# Patient Record
Sex: Male | Born: 1955 | Race: White | Hispanic: No | Marital: Single | Smoking: Never smoker
Health system: Southern US, Community
[De-identification: ages and names within clinical notes are randomized; demographics above are authoritative.]

## PROBLEM LIST (undated history)

## (undated) DIAGNOSIS — G4730 Sleep apnea, unspecified: Secondary | ICD-10-CM

## (undated) DIAGNOSIS — Z9889 Other specified postprocedural states: Secondary | ICD-10-CM

## (undated) DIAGNOSIS — R569 Unspecified convulsions: Secondary | ICD-10-CM

## (undated) DIAGNOSIS — E7800 Pure hypercholesterolemia, unspecified: Secondary | ICD-10-CM

## (undated) DIAGNOSIS — G629 Polyneuropathy, unspecified: Secondary | ICD-10-CM

## (undated) DIAGNOSIS — R7303 Prediabetes: Secondary | ICD-10-CM

## (undated) DIAGNOSIS — R112 Nausea with vomiting, unspecified: Secondary | ICD-10-CM

## (undated) DIAGNOSIS — I10 Essential (primary) hypertension: Secondary | ICD-10-CM

## (undated) HISTORY — PX: HERNIA REPAIR: SHX51

## (undated) HISTORY — PX: CHOLECYSTECTOMY: SHX55

## (undated) HISTORY — PX: APPENDECTOMY: SHX54

## (undated) HISTORY — PX: GANGLION CYST EXCISION: SHX1691

## (undated) NOTE — *Deleted (*Deleted)
Peripherally Inserted Central Catheter Placement  The IV Nurse has discussed with the patient and/or persons authorized to consent for the patient, the purpose of this procedure and the potential benefits and risks involved with this procedure.  The benefits include less needle sticks, lab draws from the catheter, and the patient may be discharged home with the catheter. Risks include, but not limited to, infection, bleeding, blood clot (thrombus formation), and puncture of an artery; nerve damage and irregular heartbeat and possibility to perform a PICC exchange if needed/ordered by physician.  Alternatives to this procedure were also discussed.  Bard Power PICC patient education guide, fact sheet on infection prevention and patient information card has been provided to patient /or left at bedside.    PICC Placement Documentation  PICC Single Lumen 02/10/20 PICC Right Cephalic 38 cm 0 cm (Active)  Indication for Insertion or Continuance of Line Home intravenous therapies (PICC only) 02/10/20 1220  Exposed Catheter (cm) 0 cm 02/10/20 1220  Site Assessment Clean;Dry;Intact 02/10/20 1220  Line Status Flushed;Saline locked;Blood return noted 02/10/20 1220  Dressing Type Transparent 02/10/20 1220  Dressing Status Clean;Dry;Intact 02/10/20 1220  Antimicrobial disc in place? Yes 02/10/20 1220  Safety Lock Not Applicable 02/10/20 1220  Line Adjustment (NICU/IV Team Only) No 02/10/20 1220  Dressing Intervention New dressing 02/10/20 1220  Dressing Change Due 02/17/20 02/10/20 1220       Mishal Probert, Lajean Manes 02/10/2020, 12:22 PM

---

## 1997-08-28 ENCOUNTER — Ambulatory Visit (HOSPITAL_BASED_OUTPATIENT_CLINIC_OR_DEPARTMENT_OTHER): Admission: RE | Admit: 1997-08-28 | Discharge: 1997-08-28 | Payer: Self-pay | Admitting: Surgery

## 1997-09-13 ENCOUNTER — Emergency Department (HOSPITAL_COMMUNITY): Admission: EM | Admit: 1997-09-13 | Discharge: 1997-09-13 | Payer: Self-pay | Admitting: Emergency Medicine

## 1997-09-16 ENCOUNTER — Ambulatory Visit (HOSPITAL_COMMUNITY): Admission: RE | Admit: 1997-09-16 | Discharge: 1997-09-16 | Payer: Self-pay | Admitting: Surgery

## 2001-01-28 ENCOUNTER — Ambulatory Visit (HOSPITAL_BASED_OUTPATIENT_CLINIC_OR_DEPARTMENT_OTHER): Admission: RE | Admit: 2001-01-28 | Discharge: 2001-01-28 | Payer: Self-pay

## 2001-02-14 ENCOUNTER — Ambulatory Visit (HOSPITAL_COMMUNITY): Admission: RE | Admit: 2001-02-14 | Discharge: 2001-02-14 | Payer: Self-pay | Admitting: Surgery

## 2001-02-14 ENCOUNTER — Encounter: Payer: Self-pay | Admitting: Surgery

## 2001-03-05 ENCOUNTER — Ambulatory Visit (HOSPITAL_COMMUNITY): Admission: RE | Admit: 2001-03-05 | Discharge: 2001-03-05 | Payer: Self-pay | Admitting: Gastroenterology

## 2001-03-05 ENCOUNTER — Encounter: Payer: Self-pay | Admitting: Gastroenterology

## 2002-04-07 ENCOUNTER — Emergency Department (HOSPITAL_COMMUNITY): Admission: EM | Admit: 2002-04-07 | Discharge: 2002-04-07 | Payer: Self-pay

## 2003-11-12 ENCOUNTER — Encounter: Admission: RE | Admit: 2003-11-12 | Discharge: 2003-11-12 | Payer: Self-pay | Admitting: Family Medicine

## 2003-11-15 ENCOUNTER — Encounter: Admission: RE | Admit: 2003-11-15 | Discharge: 2003-11-15 | Payer: Self-pay | Admitting: Family Medicine

## 2015-02-11 ENCOUNTER — Encounter: Payer: Self-pay | Admitting: *Deleted

## 2015-02-12 ENCOUNTER — Ambulatory Visit: Payer: BLUE CROSS/BLUE SHIELD | Admitting: Anesthesiology

## 2015-02-12 ENCOUNTER — Encounter
Admission: RE | Disposition: A | Discharge status: Home / Self Care | Payer: Self-pay | Source: Ambulatory Visit | Attending: Unknown Physician Specialty

## 2015-02-12 ENCOUNTER — Encounter: Payer: Self-pay | Admitting: *Deleted

## 2015-02-12 ENCOUNTER — Ambulatory Visit
Admission: RE | Admit: 2015-02-12 | Discharge: 2015-02-12 | Disposition: A | Discharge status: Home / Self Care | Payer: BLUE CROSS/BLUE SHIELD | Source: Ambulatory Visit | Attending: Unknown Physician Specialty | Admitting: Unknown Physician Specialty

## 2015-02-12 DIAGNOSIS — Z7951 Long term (current) use of inhaled steroids: Secondary | ICD-10-CM | POA: Diagnosis not present

## 2015-02-12 DIAGNOSIS — Z7982 Long term (current) use of aspirin: Secondary | ICD-10-CM | POA: Diagnosis not present

## 2015-02-12 DIAGNOSIS — Z1211 Encounter for screening for malignant neoplasm of colon: Secondary | ICD-10-CM | POA: Insufficient documentation

## 2015-02-12 DIAGNOSIS — Z8669 Personal history of other diseases of the nervous system and sense organs: Secondary | ICD-10-CM | POA: Insufficient documentation

## 2015-02-12 DIAGNOSIS — G4730 Sleep apnea, unspecified: Secondary | ICD-10-CM | POA: Insufficient documentation

## 2015-02-12 DIAGNOSIS — G629 Polyneuropathy, unspecified: Secondary | ICD-10-CM | POA: Insufficient documentation

## 2015-02-12 DIAGNOSIS — I10 Essential (primary) hypertension: Secondary | ICD-10-CM | POA: Insufficient documentation

## 2015-02-12 DIAGNOSIS — E7800 Pure hypercholesterolemia, unspecified: Secondary | ICD-10-CM | POA: Insufficient documentation

## 2015-02-12 DIAGNOSIS — Z9889 Other specified postprocedural states: Secondary | ICD-10-CM | POA: Diagnosis not present

## 2015-02-12 DIAGNOSIS — Z79899 Other long term (current) drug therapy: Secondary | ICD-10-CM | POA: Diagnosis not present

## 2015-02-12 DIAGNOSIS — K5730 Diverticulosis of large intestine without perforation or abscess without bleeding: Secondary | ICD-10-CM | POA: Insufficient documentation

## 2015-02-12 DIAGNOSIS — K640 First degree hemorrhoids: Secondary | ICD-10-CM | POA: Diagnosis not present

## 2015-02-12 DIAGNOSIS — Z9049 Acquired absence of other specified parts of digestive tract: Secondary | ICD-10-CM | POA: Diagnosis not present

## 2015-02-12 HISTORY — DX: Pure hypercholesterolemia, unspecified: E78.00

## 2015-02-12 HISTORY — DX: Essential (primary) hypertension: I10

## 2015-02-12 HISTORY — DX: Sleep apnea, unspecified: G47.30

## 2015-02-12 HISTORY — PX: COLONOSCOPY: SHX5424

## 2015-02-12 HISTORY — DX: Unspecified convulsions: R56.9

## 2015-02-12 HISTORY — DX: Polyneuropathy, unspecified: G62.9

## 2015-02-12 SURGERY — COLONOSCOPY
Anesthesia: General

## 2015-02-12 MED ORDER — LIDOCAINE HCL (PF) 2 % IJ SOLN
INTRAMUSCULAR | Status: DC | PRN
  Administered 2015-02-12: 50 mg

## 2015-02-12 MED ORDER — SODIUM CHLORIDE 0.9 % IV SOLN
INTRAVENOUS | Status: DC
  Administered 2015-02-12: 11:00:00 via INTRAVENOUS

## 2015-02-12 MED ORDER — FENTANYL CITRATE (PF) 100 MCG/2ML IJ SOLN
INTRAMUSCULAR | Status: DC | PRN
  Administered 2015-02-12: 50 ug via INTRAVENOUS

## 2015-02-12 MED ORDER — PROPOFOL 500 MG/50ML IV EMUL
INTRAVENOUS | Status: DC | PRN
  Administered 2015-02-12: 140 ug/kg/min via INTRAVENOUS

## 2015-02-12 MED ORDER — MIDAZOLAM HCL 5 MG/5ML IJ SOLN
INTRAMUSCULAR | Status: DC | PRN
  Administered 2015-02-12: 1 mg via INTRAVENOUS

## 2015-02-12 MED ORDER — PROPOFOL 10 MG/ML IV BOLUS
INTRAVENOUS | Status: DC | PRN
  Administered 2015-02-12: 50 mg via INTRAVENOUS

## 2015-02-12 MED ORDER — SODIUM CHLORIDE 0.9 % IV SOLN: INTRAVENOUS | Status: DC

## 2015-02-12 NOTE — Transfer of Care (Signed)
Immediate Anesthesia Transfer of Care Note  Patient: Martin Hanna  Procedure(s) Performed: Procedure(s): COLONOSCOPY (N/A)  Patient Location: PACU  Anesthesia Type:General  Level of Consciousness: sedated  Airway & Oxygen Therapy: Patient Spontanous Breathing and Patient connected to nasal cannula oxygen  Post-op Assessment: Report given to RN and Post -op Vital signs reviewed and stable  Post vital signs: Reviewed and stable  Last Vitals:  Filed Vitals:   02/12/15 1155  BP: 113/69  Pulse: 81  Temp: 36.2 C  Resp: 15    Complications: No apparent anesthesia complications

## 2015-02-12 NOTE — H&P (Signed)
   Primary Care Physician:  Fae Pippin Primary Gastroenterologist:  Dr. Vira Agar  Pre-Procedure History & Physical: HPI:  Martin Hanna is a 59 y.o. male is here for an colonoscopy.   Past Medical History  Diagnosis Date  . Neuropathy ([place])   . Hypercholesteremia   . Hypertension   . Sleep apnea   . Seizures ([place])     x2 at 59 years old; none before or since that time    Past Surgical History  Procedure Laterality Date  . Ganglion cyst excision    . Cholecystectomy    . Hernia repair    . Appendectomy      Prior to Admission medications   Medication Sig Start Date End Date Taking? Authorizing Provider  aspirin 81 MG tablet Take 81 mg by mouth daily.   Yes Historical Provider, MD  fluticasone (VERAMYST) 27.5 MCG/SPRAY nasal spray Place 2 sprays into the nose daily.   Yes Historical Provider, MD  geriatric multivitamins-minerals (ELDERTONIC/GEVRABON) ELIX Take 15 mLs by mouth daily.   Yes Historical Provider, MD  lisinopril-hydrochlorothiazide (PRINZIDE,ZESTORETIC) 20-12.5 MG tablet Take 1 tablet by mouth daily.   Yes Historical Provider, MD  naproxen sodium (ANAPROX) 220 MG tablet Take 220 mg by mouth 2 (two) times daily with a meal.   Yes Historical Provider, MD  Omega-3 Fatty Acids (FISH OIL) 1200 MG CAPS Take by mouth.   Yes Historical Provider, MD  traMADol (ULTRAM) 50 MG tablet Take by mouth every 6 (six) hours as needed.   Yes Historical Provider, MD    Allergies as of 01/11/2015  . (Not on File)    History reviewed. No pertinent family history.  Social History   Social History  . Marital Status: Single    Spouse Name: N/A  . Number of Children: N/A  . Years of Education: N/A   Occupational History  . Not on file.   Social History Main Topics  . Smoking status: Never Smoker   . Smokeless tobacco: Not on file  . Alcohol Use: No  . Drug Use: No  . Sexual Activity: Not on file   Other Topics Concern  . Not on file   Social History Narrative     Review of Systems: See HPI, otherwise negative ROS  Physical Exam: BP 153/88 mmHg  Pulse 94  Temp(Src) 98.6 F (37 C) (Tympanic)  Resp 16  Ht 5\' 5"  (1.651 m)  Wt 95.255 kg (210 lb)  BMI 34.95 kg/m2  SpO2 95% General:   Alert,  pleasant and cooperative in NAD Head:  Normocephalic and atraumatic. Neck:  Supple; no masses or thyromegaly. Lungs:  Clear throughout to auscultation.    Heart:  Regular rate and rhythm. Abdomen:  Soft, nontender and nondistended. Normal bowel sounds, without guarding, and without rebound.   Neurologic:  Alert and  oriented x4;  grossly normal neurologically.  Impression/Plan: Martin Hanna is here for an colonoscopy to be performed for screening  Risks, benefits, limitations, and alternatives regarding  colonoscopy have been reviewed with the patient.  Questions have been answered.  All parties agreeable.   Gaylyn Cheers, MD  02/12/2015, 11:23 AM

## 2015-02-12 NOTE — Anesthesia Preprocedure Evaluation (Signed)
Anesthesia Evaluation  Patient identified by MRN, date of birth, ID band Patient awake    Reviewed: Allergy & Precautions, H&P , NPO status , Patient's Chart, lab work & pertinent test results  History of Anesthesia Complications Negative for: history of anesthetic complications  Airway Mallampati: III  TM Distance: >3 FB Neck ROM: full    Dental  (+) Poor Dentition, Missing, Upper Dentures, Lower Dentures   Pulmonary sleep apnea ,    Pulmonary exam normal breath sounds clear to auscultation       Cardiovascular Exercise Tolerance: Good hypertension, (-) angina(-) DOE Normal cardiovascular exam Rhythm:regular Rate:Normal     Neuro/Psych Seizures -, Well Controlled,  negative psych ROS   GI/Hepatic negative GI ROS, Neg liver ROS,   Endo/Other  negative endocrine ROS  Renal/GU negative Renal ROS  negative genitourinary   Musculoskeletal   Abdominal   Peds  Hematology negative hematology ROS (+)   Anesthesia Other Findings Past Medical History:   Neuropathy ([facility])                                             Hypercholesteremia                                           Hypertension                                                 Sleep apnea                                                  Seizures ([facility])                                                 Comment:x72 at 59 years old; none before or since that               time  Past Surgical History:   GANGLION CYST EXCISION                                        CHOLECYSTECTOMY                                               HERNIA REPAIR                                                 APPENDECTOMY  BMI    Body Mass Index   34.94 kg/m 2      Reproductive/Obstetrics negative OB ROS                             Anesthesia Physical Anesthesia Plan  ASA: III  Anesthesia Plan: General    Post-op Pain Management:    Induction:   Airway Management Planned:   Additional Equipment:   Intra-op Plan:   Post-operative Plan:   Informed Consent: I have reviewed the patients History and Physical, chart, labs and discussed the procedure including the risks, benefits and alternatives for the proposed anesthesia with the patient or authorized representative who has indicated his/her understanding and acceptance.   Dental Advisory Given  Plan Discussed with: Anesthesiologist, CRNA and Surgeon  Anesthesia Plan Comments:         Anesthesia Quick Evaluation

## 2015-02-12 NOTE — Anesthesia Postprocedure Evaluation (Signed)
  Anesthesia Post-op Note  Patient: Martin Hanna  Procedure(s) Performed: Procedure(s): COLONOSCOPY (N/A)  Anesthesia type:General  Patient location: PACU  Post pain: Pain level controlled  Post assessment: Post-op Vital signs reviewed, Patient's Cardiovascular Status Stable, Respiratory Function Stable, Patent Airway and No signs of Nausea or vomiting  Post vital signs: Reviewed and stable  Last Vitals:  Filed Vitals:   02/12/15 1225  BP: 117/71  Pulse: 68  Temp:   Resp: 17    Level of consciousness: awake, alert  and patient cooperative  Complications: No apparent anesthesia complications

## 2015-02-12 NOTE — Op Note (Signed)
[facility] Gastroenterology Patient Name: Martin Hanna Procedure Date: 02/12/2015 11:26 AM MRN: [id number] Account #: [id number] Date of Birth: 1955-11-19 Admit Type: Outpatient Age: 59 Room: [facility] ENDO ROOM 1 Gender: Male Note Status: Finalized Procedure:         Colonoscopy Indications:       Screening for colorectal malignant neoplasm Providers:         Manya Silvas, MD Medicines:         Propofol per Anesthesia Complications:     No immediate complications. Procedure:         Pre-Anesthesia Assessment:                    - After reviewing the risks and benefits, the patient was                     deemed in satisfactory condition to undergo the procedure.                    After obtaining informed consent, the colonoscope was                     passed under direct vision. Throughout the procedure, the                     patient's blood pressure, pulse, and oxygen saturations                     were monitored continuously. The Colonoscope was                     introduced through the anus and advanced to the the cecum,                     identified by appendiceal orifice and ileocecal valve. The                     colonoscopy was performed without difficulty. The patient                     tolerated the procedure well. The quality of the bowel                     preparation was good. Findings:      Many small-mouthed diverticula were found in the sigmoid colon and in       the descending colon.      Internal hemorrhoids were found during endoscopy. The hemorrhoids were       small, medium-sized and Grade I (internal hemorrhoids that do not       prolapse).      The exam was otherwise without abnormality. Impression:        - Diverticulosis in the sigmoid colon and in the                     descending colon.                    - Internal hemorrhoids.                    - The examination was otherwise normal.                    - No  specimens collected. Recommendation:    - Repeat colonoscopy in 10 years for  screening purposes. Manya Silvas, MD 02/12/2015 11:55:23 AM This report has been signed electronically. Number of Addenda: 0 Note Initiated On: 02/12/2015 11:26 AM Scope Withdrawal Time: 0 hours 8 minutes 37 seconds  Total Procedure Duration: 0 hours 19 minutes 38 seconds       [facility]

## 2016-07-17 DIAGNOSIS — I10 Essential (primary) hypertension: Secondary | ICD-10-CM | POA: Insufficient documentation

## 2016-07-17 DIAGNOSIS — E669 Obesity, unspecified: Secondary | ICD-10-CM | POA: Insufficient documentation

## 2016-07-17 DIAGNOSIS — Z6837 Body mass index (BMI) 37.0-37.9, adult: Secondary | ICD-10-CM | POA: Insufficient documentation

## 2016-07-17 DIAGNOSIS — G629 Polyneuropathy, unspecified: Secondary | ICD-10-CM | POA: Insufficient documentation

## 2016-09-08 ENCOUNTER — Encounter (HOSPITAL_BASED_OUTPATIENT_CLINIC_OR_DEPARTMENT_OTHER): Payer: Self-pay | Admitting: *Deleted

## 2016-09-13 ENCOUNTER — Ambulatory Visit: Payer: Self-pay | Admitting: Otolaryngology

## 2016-09-13 NOTE — H&P (Signed)
PREOPERATIVE H&P  Chief Complaint: decreased hearing in the left ear  HPI: Martin Hanna is a 61 y.o. male who presents for evaluation of decreased hearing in the left ear. He has a history of an old slag injury to the left ear over 10 years ago. He recently developed an ear infection with drainage from a perforation 2 months ago. The infection has resolved but he still can't hear well and on a gram has a 20 dB conductive hearing loss on the left.  Past Medical History:  Diagnosis Date  . Hypercholesteremia   . Hypertension   . Neuropathy   . Seizures ([place])    x37 at 61 years old; none before or since that time  . Sleep apnea    Past Surgical History:  Procedure Laterality Date  . APPENDECTOMY    . CHOLECYSTECTOMY    . COLONOSCOPY N/A 02/12/2015   Procedure: COLONOSCOPY;  Surgeon: Manya Silvas, MD;  Location: [facility] ENDOSCOPY;  Service: Endoscopy;  Laterality: N/A;  . GANGLION CYST EXCISION    . HERNIA REPAIR     Social History   Social History  . Marital status: Single    Spouse name: N/A  . Number of children: N/A  . Years of education: N/A   Social History Main Topics  . Smoking status: Never Smoker  . Smokeless tobacco: Never Used  . Alcohol use No  . Drug use: No  . Sexual activity: Not Asked   Other Topics Concern  . None   Social History Narrative  . None   History reviewed. No pertinent family history. Allergies  Allergen Reactions  . Crestor [Rosuvastatin Calcium]     Causes muscle cramps   Prior to Admission medications   Medication Sig Start Date End Date Taking? Authorizing Provider  alfuzosin (UROXATRAL) 10 MG 24 hr tablet Take 10 mg by mouth daily with breakfast.   Yes [provider]  aspirin 81 MG tablet Take 81 mg by mouth daily.   Yes [provider]  fluticasone (VERAMYST) 27.5 MCG/SPRAY nasal spray Place 2 sprays into the nose daily.   Yes [provider]  geriatric multivitamins-minerals  (ELDERTONIC/GEVRABON) ELIX Take 15 mLs by mouth daily.   Yes [provider]  Omega-3 Fatty Acids (FISH OIL) 1200 MG CAPS Take by mouth.   Yes [provider]  traMADol (ULTRAM) 50 MG tablet Take by mouth every 6 (six) hours as needed.   Yes [provider]  valsartan-hydrochlorothiazide (DIOVAN-HCT) 160-25 MG tablet Take 1 tablet by mouth daily.   Yes [provider]  naproxen sodium (ANAPROX) 220 MG tablet Take 220 mg by mouth 2 (two) times daily with a meal.    [provider]     Positive ROS: per HPI  All other systems have been reviewed and were otherwise negative with the exception of those mentioned in the HPI and as above.  Physical Exam: There were no vitals filed for this visit.  General: Alert, no acute distress Oral: Normal oral mucosa and tonsils Nasal: Clear nasal passages Neck: No palpable adenopathy or thyroid nodules Ear: Ear canal is clear with normal appearing TM on the right. Left TM with central TM perforation and partial separation of TM from malleus Cardiovascular: Regular rate and rhythm, no murmur.  Respiratory: Clear to auscultation Neurologic: Alert and oriented x 3   Assessment/Plan: TM PERFORATION/CONDUCTIVE HEARING LOSS Plan for Procedure(s): LEFT SIDE TYMPANOPLASTY   Melony Overly, MD 09/13/2016 2:58 PM

## 2016-09-14 ENCOUNTER — Encounter (HOSPITAL_BASED_OUTPATIENT_CLINIC_OR_DEPARTMENT_OTHER)
Admission: RE | Admit: 2016-09-14 | Discharge: 2016-09-14 | Disposition: A | Discharge status: Home / Self Care | Payer: BLUE CROSS/BLUE SHIELD | Source: Ambulatory Visit | Attending: Otolaryngology | Admitting: Otolaryngology

## 2016-09-14 DIAGNOSIS — Z7982 Long term (current) use of aspirin: Secondary | ICD-10-CM | POA: Diagnosis not present

## 2016-09-14 DIAGNOSIS — E7800 Pure hypercholesterolemia, unspecified: Secondary | ICD-10-CM | POA: Diagnosis not present

## 2016-09-14 DIAGNOSIS — Z79899 Other long term (current) drug therapy: Secondary | ICD-10-CM | POA: Diagnosis not present

## 2016-09-14 DIAGNOSIS — I10 Essential (primary) hypertension: Secondary | ICD-10-CM | POA: Diagnosis not present

## 2016-09-14 DIAGNOSIS — H7202 Central perforation of tympanic membrane, left ear: Secondary | ICD-10-CM | POA: Diagnosis present

## 2016-09-14 DIAGNOSIS — H902 Conductive hearing loss, unspecified: Secondary | ICD-10-CM | POA: Diagnosis not present

## 2016-09-14 DIAGNOSIS — Z791 Long term (current) use of non-steroidal anti-inflammatories (NSAID): Secondary | ICD-10-CM | POA: Diagnosis not present

## 2016-09-14 LAB — BASIC METABOLIC PANEL
ANION GAP: 10 (ref 5–15)
BUN: 17 mg/dL (ref 6–20)
CO2: 27 mmol/L (ref 22–32)
Calcium: 9.1 mg/dL (ref 8.9–10.3)
Chloride: 100 mmol/L — ABNORMAL LOW (ref 101–111)
Creatinine, Ser: 0.94 mg/dL (ref 0.61–1.24)
GFR calc Af Amer: >60 mL/min (ref >60)
GLUCOSE: 122 mg/dL — AB (ref 65–99)
POTASSIUM: 4 mmol/L (ref 3.5–5.1)
Sodium: 137 mmol/L (ref 135–145)

## 2016-09-14 NOTE — Progress Notes (Addendum)
Dr.Miller reviewed EKG, read note from Primary Dr. In [place] Alert- ok for surgery. Called Dr. Neena Rhymes office in [place] for  Previous Ekg - none on file.

## 2016-09-15 ENCOUNTER — Ambulatory Visit (HOSPITAL_BASED_OUTPATIENT_CLINIC_OR_DEPARTMENT_OTHER): Payer: BLUE CROSS/BLUE SHIELD | Admitting: Anesthesiology

## 2016-09-15 ENCOUNTER — Ambulatory Visit (HOSPITAL_BASED_OUTPATIENT_CLINIC_OR_DEPARTMENT_OTHER)
Admission: RE | Admit: 2016-09-15 | Discharge: 2016-09-15 | Disposition: A | Discharge status: Home / Self Care | Payer: BLUE CROSS/BLUE SHIELD | Source: Ambulatory Visit | Attending: Otolaryngology | Admitting: Otolaryngology

## 2016-09-15 ENCOUNTER — Encounter (HOSPITAL_BASED_OUTPATIENT_CLINIC_OR_DEPARTMENT_OTHER)
Admission: RE | Disposition: A | Discharge status: Home / Self Care | Payer: Self-pay | Source: Ambulatory Visit | Attending: Otolaryngology

## 2016-09-15 ENCOUNTER — Encounter (HOSPITAL_BASED_OUTPATIENT_CLINIC_OR_DEPARTMENT_OTHER): Payer: Self-pay | Admitting: *Deleted

## 2016-09-15 DIAGNOSIS — Z791 Long term (current) use of non-steroidal anti-inflammatories (NSAID): Secondary | ICD-10-CM | POA: Insufficient documentation

## 2016-09-15 DIAGNOSIS — Z7982 Long term (current) use of aspirin: Secondary | ICD-10-CM | POA: Insufficient documentation

## 2016-09-15 DIAGNOSIS — H902 Conductive hearing loss, unspecified: Secondary | ICD-10-CM | POA: Insufficient documentation

## 2016-09-15 DIAGNOSIS — H7202 Central perforation of tympanic membrane, left ear: Secondary | ICD-10-CM | POA: Insufficient documentation

## 2016-09-15 DIAGNOSIS — I10 Essential (primary) hypertension: Secondary | ICD-10-CM | POA: Insufficient documentation

## 2016-09-15 DIAGNOSIS — E7800 Pure hypercholesterolemia, unspecified: Secondary | ICD-10-CM | POA: Insufficient documentation

## 2016-09-15 DIAGNOSIS — Z79899 Other long term (current) drug therapy: Secondary | ICD-10-CM | POA: Insufficient documentation

## 2016-09-15 HISTORY — PX: TYMPANOPLASTY: SHX33

## 2016-09-15 SURGERY — TYMPANOPLASTY
Anesthesia: General | Site: Ear | Laterality: Left

## 2016-09-15 MED ORDER — DEXAMETHASONE SODIUM PHOSPHATE 4 MG/ML IJ SOLN
INTRAMUSCULAR | Status: DC | PRN
  Administered 2016-09-15: 10 mg via INTRAVENOUS

## 2016-09-15 MED ORDER — LIDOCAINE-EPINEPHRINE 1 %-1:100000 IJ SOLN
INTRAMUSCULAR | Status: AC
  Filled 2016-09-15: qty 1

## 2016-09-15 MED ORDER — METHYLENE BLUE 0.5 % INJ SOLN
INTRAVENOUS | Status: DC | PRN
  Administered 2016-09-15: .1 mL via SUBMUCOSAL

## 2016-09-15 MED ORDER — EPHEDRINE SULFATE 50 MG/ML IJ SOLN
INTRAMUSCULAR | Status: DC | PRN
  Administered 2016-09-15 (×3): 10 mg via INTRAVENOUS

## 2016-09-15 MED ORDER — LIDOCAINE HCL 2 % IJ SOLN
INTRAMUSCULAR | Status: AC
  Filled 2016-09-15: qty 20

## 2016-09-15 MED ORDER — CHLORHEXIDINE GLUCONATE CLOTH 2 % EX PADS: 6.0000 | MEDICATED_PAD | Freq: Once | CUTANEOUS | Status: DC

## 2016-09-15 MED ORDER — HYDROMORPHONE HCL 1 MG/ML IJ SOLN
0.2500 mg | INTRAMUSCULAR | Status: DC | PRN
  Administered 2016-09-15: 0.5 mg via INTRAVENOUS

## 2016-09-15 MED ORDER — PROMETHAZINE HCL 25 MG/ML IJ SOLN: 6.2500 mg | INTRAMUSCULAR | Status: DC | PRN

## 2016-09-15 MED ORDER — CIPROFLOXACIN-DEXAMETHASONE 0.3-0.1 % OT SUSP
OTIC | Status: DC | PRN
  Administered 2016-09-15: 4 [drp] via OTIC

## 2016-09-15 MED ORDER — BACITRACIN ZINC 500 UNIT/GM EX OINT
TOPICAL_OINTMENT | CUTANEOUS | Status: DC | PRN
  Administered 2016-09-15: 1 via TOPICAL

## 2016-09-15 MED ORDER — PROPOFOL 10 MG/ML IV BOLUS
INTRAVENOUS | Status: DC | PRN
  Administered 2016-09-15: 200 mg via INTRAVENOUS

## 2016-09-15 MED ORDER — MIDAZOLAM HCL 2 MG/2ML IJ SOLN
1.0000 mg | INTRAMUSCULAR | Status: DC | PRN
  Administered 2016-09-15: 2 mg via INTRAVENOUS

## 2016-09-15 MED ORDER — CIPROFLOXACIN-DEXAMETHASONE 0.3-0.1 % OT SUSP
OTIC | Status: AC
  Filled 2016-09-15: qty 7.5

## 2016-09-15 MED ORDER — PROPOFOL 10 MG/ML IV BOLUS
INTRAVENOUS | Status: AC
  Filled 2016-09-15: qty 20

## 2016-09-15 MED ORDER — PHENYLEPHRINE HCL 10 MG/ML IJ SOLN
INTRAVENOUS | Status: DC | PRN
  Administered 2016-09-15: 40 ug/min via INTRAVENOUS

## 2016-09-15 MED ORDER — SCOPOLAMINE 1 MG/3DAYS TD PT72: 1.0000 | MEDICATED_PATCH | Freq: Once | TRANSDERMAL | Status: DC | PRN

## 2016-09-15 MED ORDER — EPINEPHRINE 30 MG/30ML IJ SOLN
INTRAMUSCULAR | Status: AC
  Filled 2016-09-15: qty 1

## 2016-09-15 MED ORDER — LACTATED RINGERS IV SOLN
INTRAVENOUS | Status: DC
  Administered 2016-09-15 (×2): via INTRAVENOUS

## 2016-09-15 MED ORDER — FENTANYL CITRATE (PF) 100 MCG/2ML IJ SOLN
50.0000 ug | INTRAMUSCULAR | Status: DC | PRN
  Administered 2016-09-15: 100 ug via INTRAVENOUS

## 2016-09-15 MED ORDER — METHYLENE BLUE 0.5 % INJ SOLN
INTRAVENOUS | Status: AC
  Filled 2016-09-15: qty 10

## 2016-09-15 MED ORDER — EPINEPHRINE PF 1 MG/ML IJ SOLN
INTRAMUSCULAR | Status: DC | PRN
  Administered 2016-09-15: .2 mL

## 2016-09-15 MED ORDER — HYDROMORPHONE HCL 1 MG/ML IJ SOLN
INTRAMUSCULAR | Status: AC
  Filled 2016-09-15: qty 0.5

## 2016-09-15 MED ORDER — LIDOCAINE-EPINEPHRINE 1 %-1:100000 IJ SOLN
INTRAMUSCULAR | Status: DC | PRN
  Administered 2016-09-15: 3.5 mL

## 2016-09-15 MED ORDER — SUCCINYLCHOLINE CHLORIDE 20 MG/ML IJ SOLN
INTRAMUSCULAR | Status: DC | PRN
  Administered 2016-09-15: 120 mg via INTRAVENOUS

## 2016-09-15 MED ORDER — FENTANYL CITRATE (PF) 100 MCG/2ML IJ SOLN
INTRAMUSCULAR | Status: AC
  Filled 2016-09-15: qty 2

## 2016-09-15 MED ORDER — ONDANSETRON HCL 4 MG/2ML IJ SOLN
INTRAMUSCULAR | Status: DC | PRN
  Administered 2016-09-15: 4 mg via INTRAVENOUS

## 2016-09-15 MED ORDER — HYDROCODONE-ACETAMINOPHEN 5-325 MG PO TABS: 1.0000 | ORAL_TABLET | Freq: Four times a day (QID) | ORAL | 0 refills | Status: DC | PRN

## 2016-09-15 MED ORDER — CEFAZOLIN SODIUM-DEXTROSE 2-4 GM/100ML-% IV SOLN
INTRAVENOUS | Status: AC
  Filled 2016-09-15: qty 100

## 2016-09-15 MED ORDER — MIDAZOLAM HCL 2 MG/2ML IJ SOLN
INTRAMUSCULAR | Status: AC
Start: 2016-09-15 — End: 2016-09-15
  Filled 2016-09-15: qty 2

## 2016-09-15 MED ORDER — CEFAZOLIN SODIUM-DEXTROSE 2-4 GM/100ML-% IV SOLN
2.0000 g | INTRAVENOUS | Status: AC
  Administered 2016-09-15: 2 g via INTRAVENOUS

## 2016-09-15 MED ORDER — BACITRACIN ZINC 500 UNIT/GM EX OINT
TOPICAL_OINTMENT | CUTANEOUS | Status: AC
  Filled 2016-09-15: qty 28.35

## 2016-09-15 MED ORDER — CEPHALEXIN 500 MG PO CAPS: 500.0000 mg | ORAL_CAPSULE | Freq: Two times a day (BID) | ORAL | 0 refills | Status: AC

## 2016-09-15 SURGICAL SUPPLY — 57 items
BENZOIN TINCTURE PRP APPL 2/3 (GAUZE/BANDAGES/DRESSINGS) ×6 IMPLANT
BLADE CLIPPER SURG (BLADE) ×3 IMPLANT
BLADE EAR TYMPAN 2.5 60D BEAV (BLADE) ×3 IMPLANT
BLADE EYE SICKLE 84 5 BEAV (BLADE) IMPLANT
BLADE EYE SICKLE 84 5MM BEAV (BLADE)
BLADE NEEDLE 3 SS STRL (BLADE) ×2 IMPLANT
BLADE NEEDLE 3MM SS STRL (BLADE) ×1
CANISTER SUCT 1200ML W/VALVE (MISCELLANEOUS) ×3 IMPLANT
CLOSURE WOUND 1/2 X4 (GAUZE/BANDAGES/DRESSINGS)
COTTONBALL LRG STERILE PKG (GAUZE/BANDAGES/DRESSINGS) ×3 IMPLANT
DECANTER SPIKE VIAL GLASS SM (MISCELLANEOUS) ×3 IMPLANT
DEPRESSOR TONGUE BLADE STERILE (MISCELLANEOUS) ×6 IMPLANT
DERMABOND ADVANCED (GAUZE/BANDAGES/DRESSINGS) ×2
DERMABOND ADVANCED .7 DNX12 (GAUZE/BANDAGES/DRESSINGS) ×1 IMPLANT
DRAPE EENT ADH APERT 31X51 STR (DRAPES) ×3 IMPLANT
DRAPE MICROSCOPE URBAN (DRAPES) ×3 IMPLANT
DRAPE SURG 17X23 STRL (DRAPES) ×3 IMPLANT
DRESSING ADAPTIC 1/2  N-ADH (PACKING) IMPLANT
DRSG GLASSCOCK MASTOID ADT (GAUZE/BANDAGES/DRESSINGS) ×3 IMPLANT
DRSG GLASSCOCK MASTOID PED (GAUZE/BANDAGES/DRESSINGS) IMPLANT
ELECT COATED BLADE 2.86 ST (ELECTRODE) ×3 IMPLANT
ELECT REM PT RETURN 9FT ADLT (ELECTROSURGICAL) ×3
ELECTRODE REM PT RTRN 9FT ADLT (ELECTROSURGICAL) ×1 IMPLANT
GAUZE SPONGE 4X4 12PLY STRL (GAUZE/BANDAGES/DRESSINGS) IMPLANT
GAUZE SPONGE 4X4 12PLY STRL LF (GAUZE/BANDAGES/DRESSINGS) ×3 IMPLANT
GLOVE SS BIOGEL STRL SZ 7.5 (GLOVE) ×1 IMPLANT
GLOVE SUPERSENSE BIOGEL SZ 7.5 (GLOVE) ×2
GOWN STRL REUS W/ TWL LRG LVL3 (GOWN DISPOSABLE) ×2 IMPLANT
GOWN STRL REUS W/ TWL XL LVL3 (GOWN DISPOSABLE) ×1 IMPLANT
GOWN STRL REUS W/TWL LRG LVL3 (GOWN DISPOSABLE) ×4
GOWN STRL REUS W/TWL XL LVL3 (GOWN DISPOSABLE) ×2
IV CATH AUTO 14GX1.75 SAFE ORG (IV SOLUTION) IMPLANT
IV NS 500ML (IV SOLUTION)
IV NS 500ML BAXH (IV SOLUTION) IMPLANT
IV SET EXT 30 76VOL 4 MALE LL (IV SETS) ×3 IMPLANT
NDL SAFETY ECLIPSE 18X1.5 (NEEDLE) IMPLANT
NEEDLE HYPO 18GX1.5 SHARP (NEEDLE)
NEEDLE PRECISIONGLIDE 27X1.5 (NEEDLE) ×3 IMPLANT
NS IRRIG 1000ML POUR BTL (IV SOLUTION) ×3 IMPLANT
PACK BASIN DAY SURGERY FS (CUSTOM PROCEDURE TRAY) ×3 IMPLANT
PACK ENT DAY SURGERY (CUSTOM PROCEDURE TRAY) ×3 IMPLANT
PENCIL BUTTON HOLSTER BLD 10FT (ELECTRODE) ×3 IMPLANT
SHEET MEDIUM DRAPE 40X70 STRL (DRAPES) ×3 IMPLANT
SLEEVE SCD COMPRESS KNEE MED (MISCELLANEOUS) ×3 IMPLANT
SPONGE SURGIFOAM ABS GEL 12-7 (HEMOSTASIS) ×3 IMPLANT
STRIP CLOSURE SKIN 1/2X4 (GAUZE/BANDAGES/DRESSINGS) IMPLANT
SUT CHROMIC 2 0 SH (SUTURE) IMPLANT
SUT CHROMIC 3 0 PS 2 (SUTURE) ×3 IMPLANT
SUT ETHILON 4 0 P 3 18 (SUTURE) IMPLANT
SUT ETHILON 5 0 P 3 18 (SUTURE)
SUT NYLON ETHILON 5-0 P-3 1X18 (SUTURE) IMPLANT
SUT PLAIN 5 0 P 3 18 (SUTURE) IMPLANT
SYR 3ML 18GX1 1/2 (SYRINGE) ×3 IMPLANT
SYR TB 1ML LL NO SAFETY (SYRINGE) IMPLANT
TOWEL OR 17X24 6PK STRL BLUE (TOWEL DISPOSABLE) ×6 IMPLANT
TRAY DSU PREP LF (CUSTOM PROCEDURE TRAY) ×3 IMPLANT
TUBING IRRIGATION (MISCELLANEOUS) ×3 IMPLANT

## 2016-09-15 NOTE — Anesthesia Procedure Notes (Signed)
Procedure Name: Intubation Date/Time: 09/15/2016 7:41 AM Performed by: Melynda Ripple D Pre-anesthesia Checklist: Patient identified, Emergency Drugs available, Suction available and Patient being monitored Patient Re-evaluated:Patient Re-evaluated prior to inductionOxygen Delivery Method: Circle system utilized Preoxygenation: Pre-oxygenation with 100% oxygen Intubation Type: IV induction Ventilation: Mask ventilation without difficulty Laryngoscope Size: Mac Grade View: Grade III Tube type: Oral Tube size: 7.0 mm Number of attempts: 1 Airway Equipment and Method: Stylet and Oral airway Placement Confirmation: ETT inserted through vocal cords under direct vision,  positive ETCO2 and breath sounds checked- equal and bilateral Secured at: 23 cm Tube secured with: Tape Dental Injury: Teeth and Oropharynx as per pre-operative assessment  Difficulty Due To: Difficult Airway- due to limited oral opening, Difficult Airway- due to large tongue and Difficulty was anticipated

## 2016-09-15 NOTE — Op Note (Signed)
NAME:  Martin Hanna, Martin Hanna                  ACCOUNT NO.:  MEDICAL RECORD NO.:  [id number]  LOCATION:                                 FACILITY:  PHYSICIAN:  Leonides Sake. Lucia Gaskins, M.D. 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  OPERATION PERFORMED:  Left medial graft tympanoplasty.  SURGEON:  Leonides Sake. Lucia Gaskins, M.D.  ANESTHESIA:  General endotracheal.  COMPLICATIONS:  None.  BRIEF CLINICAL NOTE:  Martin Hanna is a 61 year old gentleman, who has had some longstanding problems with his left ear ever since he had a slag injury several decades ago to the left ear canal and TM.  More recently, he developed acute left ear infection with drainage and had a chronic left TM perforation that has not healed.  In addition, hearing test showed a 20 to 25-decibel conductive hearing loss in the left ear. The perforation was fairly small, however, the distal half of the malleus was separated from the TM.  He is taken to the operating room at this time for left medial graft tympanoplasty.  DESCRIPTION OF PROCEDURE:  After adequate endotracheal anesthesia, the left ear was prepped with Betadine solution and draped out with sterile towels.  Photos were obtained of the ear canal and perforation.  The rim of the perforation was freshened up with a pick and cup forceps back to where the TM actually attached to the malleus.  The distal portion of the malleus that had separated from the TM was freshened up with a sharp pick.  The undersurface of the malleus was scraped.  Fraction of the mucosa underneath the malleus in order to set the graft.  A posterior- based tympanomeatal flap was elevated down to the anulus which  was elevated out of the sulcus.  A temporalis fascia graft was previously harvested from just above the right ear and set aside to dry and the donor site was closed with 3-0 chromic sutures subcutaneously and 5-0 plain gut on the skin edges.  After irrigating the middle ear space, there was a fair amount of granulation tissue and some chronic inflammation around the incus and stapes superstructure, but everything was intact and the stapes was mobile, although it was difficult to adequately visualize the oval window because of some of the chronic inflammation.  Some of these adhesions were loosened up, but the stapes was definitely mobile, and the malleus and incus were mobile as well on palpation.  The fascia graft was cut to appropriate size, was placed medial to the long process of the malleus.  The middle ear was packed with Gelfoam soaked in Ciprodex.  The tympanomeatal flap was brought back down and the fascia graft to cover the entire perforation site that had been enlarged with a pick and cup forceps.  The ear canal was then packed with Gelfoam soaked in Ciprodex.  Glasscock mastoid dressing was applied.  The patient was awoken from anesthesia and transferred to recovery room, postop doing well.  DISPOSITION:  The patient was discharged home later this morning on Keflex 500 mg b.i.d. for 1 week, Tylenol, Motrin, or hydrocodone p.r.n. pain.  We will have him follow up in my office in 1 week for recheck. We will plan on removing the ear canal packing in 2-3 weeks.          ______________________________ Leonides Sake Lucia Gaskins, M.D.     CEN/MEDQ  D:  09/15/2016  T:  09/15/2016  Job:  [id number]

## 2016-09-15 NOTE — Progress Notes (Signed)
Pt continues to be diaphoretic after cooling efforts, sats low in the 90's on room air, states his left arm is numb, Dr. Aris Lot made aware, will come by to see pt.

## 2016-09-15 NOTE — Anesthesia Postprocedure Evaluation (Signed)
Anesthesia Post Note  Patient: Martin Hanna  Procedure(s) Performed: Procedure(s) (LRB): LEFT SIDE TYMPANOPLASTY (Left)     Patient location during evaluation: PACU Anesthesia Type: General Level of consciousness: awake and alert Pain management: pain level controlled Vital Signs Assessment: post-procedure vital signs reviewed and stable Respiratory status: spontaneous breathing, nonlabored ventilation, respiratory function stable and patient connected to nasal cannula oxygen Cardiovascular status: blood pressure returned to baseline and stable Postop Assessment: no signs of nausea or vomiting Anesthetic complications: no    Last Vitals:  Vitals:   09/15/16 1100 09/15/16 1115  BP: 123/84 118/75  Pulse: 94 97  Resp: (!) 23 19  Temp:      Last Pain:  Vitals:   09/15/16 1055  TempSrc:   PainSc: 4                  Montez Hageman

## 2016-09-15 NOTE — Anesthesia Preprocedure Evaluation (Addendum)
Anesthesia Evaluation  Patient identified by MRN, date of birth, ID band Patient awake    Reviewed: Allergy & Precautions, NPO status , Patient's Chart, lab work & pertinent test results  History of Anesthesia Complications Negative for: history of anesthetic complications  Airway Mallampati: III   Neck ROM: Full  Mouth opening: Limited Mouth Opening  Dental  (+) Edentulous Upper, Edentulous Lower   Pulmonary sleep apnea ,    breath sounds clear to auscultation       Cardiovascular hypertension,  Rhythm:Regular Rate:Normal     Neuro/Psych Seizures -,     GI/Hepatic negative GI ROS, Neg liver ROS,   Endo/Other  negative endocrine ROS  Renal/GU negative Renal ROS     Musculoskeletal   Abdominal (+) + obese,   Peds  Hematology negative hematology ROS (+)   Anesthesia Other Findings   Reproductive/Obstetrics                            Anesthesia Physical Anesthesia Plan  ASA: III  Anesthesia Plan: General   Post-op Pain Management:    Induction: Intravenous  PONV Risk Score and Plan: 3 and Ondansetron, Dexamethasone, Propofol and Midazolam  Airway Management Planned: Oral ETT  Additional Equipment:   Intra-op Plan:   Post-operative Plan: Extubation in OR  Informed Consent: I have reviewed the patients History and Physical, chart, labs and discussed the procedure including the risks, benefits and alternatives for the proposed anesthesia with the patient or authorized representative who has indicated his/her understanding and acceptance.     Plan Discussed with:   Anesthesia Plan Comments:        Anesthesia Quick Evaluation

## 2016-09-15 NOTE — Discharge Instructions (Addendum)
°  Post Anesthesia Home Care Instructions  Activity: Get plenty of rest for the remainder of the day. A responsible individual must stay with you for 24 hours following the procedure.  For the next 24 hours, DO NOT: -Drive a car -Paediatric nurse -Drink alcoholic beverages -Take any medication unless instructed by your physician -Make any legal decisions or sign important papers.  Meals: Start with liquid foods such as gelatin or soup. Progress to regular foods as tolerated. Avoid greasy, spicy, heavy foods. If nausea and/or vomiting occur, drink only clear liquids until the nausea and/or vomiting subsides. Call your physician if vomiting continues.  Special Instructions/Symptoms: Your throat may feel dry or sore from the anesthesia or the breathing tube placed in your throat during surgery. If this causes discomfort, gargle with warm salt water. The discomfort should disappear within 24 hours.  If you had a scopolamine patch placed behind your ear for the management of post- operative nausea and/or vomiting:  1. The medication in the patch is effective for 72 hours, after which it should be removed.  Wrap patch in a tissue and discard in the trash. Wash hands thoroughly with soap and water. 2. You may remove the patch earlier than 72 hours if you experience unpleasant side effects which may include dry mouth, dizziness or visual disturbances. 3. Avoid touching the patch. Wash your hands with soap and water after contact with the patch.   Remove ear dressing and change cotton ball in ear tomorrow am Keep all water out of the ear canal OK to wash behind and around the ear Apply antibiotic ointment to small incision above the ear Start Keflex 500 mg twice per day tonight Tylenol, motrin or hydrocodone prn pain Call office for follow up appt in 1 week    [phone]

## 2016-09-15 NOTE — Progress Notes (Signed)
  Dr. Aris Lot by to see patient, okay given to send home.

## 2016-09-15 NOTE — Brief Op Note (Signed)
09/15/2016  9:53 AM  PATIENT:  Martin Hanna  61 y.o. male  PRE-OPERATIVE DIAGNOSIS:  TM PERFORATION/CONDUCTIVE HEARING LOSS (left)  POST-OPERATIVE DIAGNOSIS:  TM PERFORATION/CINDUCTIVE HEARING LOSS  PROCEDURE:  Procedure(s): LEFT SIDE TYMPANOPLASTY (Left)  SURGEON:  Surgeon(s) and Role:    Rozetta Nunnery, MD - Primary  PHYSICIAN ASSISTANT:   ASSISTANTS: none   ANESTHESIA:   general  EBL:  Total I/O In: 1000 [I.V.:1000] Out: -   BLOOD ADMINISTERED:none  DRAINS: none   LOCAL MEDICATIONS USED:  XYLOCAINE with EPI 5 cc  SPECIMEN:  No Specimen  DISPOSITION OF SPECIMEN:  N/A  COUNTS:  YES  TOURNIQUET:  * No tourniquets in log *  DICTATION: .Other Dictation: Dictation Number [id number]  PLAN OF CARE: Discharge to home after PACU  PATIENT DISPOSITION:  PACU - hemodynamically stable.   Delay start of Pharmacological VTE agent (>24hrs) due to surgical blood loss or risk of bleeding: yes

## 2016-09-15 NOTE — Interval H&P Note (Signed)
History and Physical Interval Note:  09/15/2016 7:28 AM  Martin Hanna  has presented today for surgery, with the diagnosis of TM PERFORATION/CONDUCTIVE HEARING LOSS  The various methods of treatment have been discussed with the patient and family. After consideration of risks, benefits and other options for treatment, the patient has consented to  Procedure(s): LEFT SIDE TYMPANOPLASTY (Left) as a surgical intervention .  The patient's history has been reviewed, patient examined, no change in status, stable for surgery.  I have reviewed the patient's chart and labs.  Questions were answered to the patient's satisfaction.     Martin Hanna

## 2016-09-15 NOTE — Transfer of Care (Signed)
Immediate Anesthesia Transfer of Care Note  Patient: Martin Hanna  Procedure(s) Performed: Procedure(s): LEFT SIDE TYMPANOPLASTY (Left)  Patient Location: PACU  Anesthesia Type:General  Level of Consciousness: awake and drowsy  Airway & Oxygen Therapy: Patient Spontanous Breathing and Patient connected to face mask oxygen  Post-op Assessment: Report given to RN and Post -op Vital signs reviewed and stable  Post vital signs: Reviewed  Last Vitals:  Vitals:   09/15/16 0646  BP: 136/87  Pulse: 78  Resp: 17  Temp: 36.7 C    Last Pain:  Vitals:   09/15/16 0646  TempSrc: Oral  PainSc: 0-No pain         Complications: No apparent anesthesia complications

## 2016-09-18 ENCOUNTER — Encounter (HOSPITAL_BASED_OUTPATIENT_CLINIC_OR_DEPARTMENT_OTHER): Payer: Self-pay | Admitting: Otolaryngology

## 2016-11-24 DIAGNOSIS — R7309 Other abnormal glucose: Secondary | ICD-10-CM | POA: Insufficient documentation

## 2017-08-02 DIAGNOSIS — S2221XA Fracture of manubrium, initial encounter for closed fracture: Secondary | ICD-10-CM | POA: Insufficient documentation

## 2017-08-02 DIAGNOSIS — S2242XD Multiple fractures of ribs, left side, subsequent encounter for fracture with routine healing: Secondary | ICD-10-CM | POA: Insufficient documentation

## 2017-08-02 DIAGNOSIS — S2239XA Fracture of one rib, unspecified side, initial encounter for closed fracture: Secondary | ICD-10-CM | POA: Insufficient documentation

## 2017-08-10 DIAGNOSIS — S2221XD Fracture of manubrium, subsequent encounter for fracture with routine healing: Secondary | ICD-10-CM | POA: Insufficient documentation

## 2017-08-31 DIAGNOSIS — L304 Erythema intertrigo: Secondary | ICD-10-CM | POA: Insufficient documentation

## 2017-08-31 DIAGNOSIS — N401 Enlarged prostate with lower urinary tract symptoms: Secondary | ICD-10-CM | POA: Insufficient documentation

## 2017-08-31 DIAGNOSIS — R350 Frequency of micturition: Secondary | ICD-10-CM | POA: Insufficient documentation

## 2017-09-03 DIAGNOSIS — R0989 Other specified symptoms and signs involving the circulatory and respiratory systems: Secondary | ICD-10-CM | POA: Insufficient documentation

## 2018-01-02 DIAGNOSIS — G8929 Other chronic pain: Secondary | ICD-10-CM | POA: Insufficient documentation

## 2018-01-02 DIAGNOSIS — R0789 Other chest pain: Secondary | ICD-10-CM | POA: Insufficient documentation

## 2018-07-19 DIAGNOSIS — Z79899 Other long term (current) drug therapy: Secondary | ICD-10-CM | POA: Diagnosis not present

## 2018-07-19 DIAGNOSIS — G8929 Other chronic pain: Secondary | ICD-10-CM | POA: Diagnosis not present

## 2018-07-19 DIAGNOSIS — R0789 Other chest pain: Secondary | ICD-10-CM | POA: Diagnosis not present

## 2018-09-25 DIAGNOSIS — H04122 Dry eye syndrome of left lacrimal gland: Secondary | ICD-10-CM | POA: Diagnosis not present

## 2018-10-17 DIAGNOSIS — M720 Palmar fascial fibromatosis [Dupuytren]: Secondary | ICD-10-CM | POA: Diagnosis not present

## 2018-11-28 DIAGNOSIS — M720 Palmar fascial fibromatosis [Dupuytren]: Secondary | ICD-10-CM | POA: Diagnosis not present

## 2018-11-29 DIAGNOSIS — M720 Palmar fascial fibromatosis [Dupuytren]: Secondary | ICD-10-CM | POA: Diagnosis not present

## 2018-12-12 DIAGNOSIS — M720 Palmar fascial fibromatosis [Dupuytren]: Secondary | ICD-10-CM | POA: Diagnosis not present

## 2018-12-27 DIAGNOSIS — M720 Palmar fascial fibromatosis [Dupuytren]: Secondary | ICD-10-CM | POA: Diagnosis not present

## 2018-12-31 DIAGNOSIS — M720 Palmar fascial fibromatosis [Dupuytren]: Secondary | ICD-10-CM | POA: Diagnosis not present

## 2019-01-14 DIAGNOSIS — M720 Palmar fascial fibromatosis [Dupuytren]: Secondary | ICD-10-CM | POA: Diagnosis not present

## 2019-02-11 DIAGNOSIS — Z125 Encounter for screening for malignant neoplasm of prostate: Secondary | ICD-10-CM | POA: Diagnosis not present

## 2019-02-11 DIAGNOSIS — G629 Polyneuropathy, unspecified: Secondary | ICD-10-CM | POA: Diagnosis not present

## 2019-02-11 DIAGNOSIS — H8110 Benign paroxysmal vertigo, unspecified ear: Secondary | ICD-10-CM | POA: Insufficient documentation

## 2019-02-11 DIAGNOSIS — I10 Essential (primary) hypertension: Secondary | ICD-10-CM | POA: Diagnosis not present

## 2019-02-11 DIAGNOSIS — R0789 Other chest pain: Secondary | ICD-10-CM | POA: Diagnosis not present

## 2019-02-11 DIAGNOSIS — G8929 Other chronic pain: Secondary | ICD-10-CM | POA: Diagnosis not present

## 2019-02-11 DIAGNOSIS — Z23 Encounter for immunization: Secondary | ICD-10-CM | POA: Diagnosis not present

## 2019-02-11 DIAGNOSIS — R7309 Other abnormal glucose: Secondary | ICD-10-CM | POA: Diagnosis not present

## 2019-03-25 DIAGNOSIS — I10 Essential (primary) hypertension: Secondary | ICD-10-CM | POA: Diagnosis not present

## 2019-03-25 DIAGNOSIS — R0789 Other chest pain: Secondary | ICD-10-CM | POA: Diagnosis not present

## 2019-03-25 DIAGNOSIS — Z6841 Body Mass Index (BMI) 40.0 and over, adult: Secondary | ICD-10-CM | POA: Diagnosis not present

## 2019-03-25 DIAGNOSIS — Z23 Encounter for immunization: Secondary | ICD-10-CM | POA: Diagnosis not present

## 2019-03-25 DIAGNOSIS — R350 Frequency of micturition: Secondary | ICD-10-CM | POA: Diagnosis not present

## 2019-03-25 DIAGNOSIS — R351 Nocturia: Secondary | ICD-10-CM | POA: Insufficient documentation

## 2019-03-25 DIAGNOSIS — N401 Enlarged prostate with lower urinary tract symptoms: Secondary | ICD-10-CM | POA: Diagnosis not present

## 2019-03-25 DIAGNOSIS — R0989 Other specified symptoms and signs involving the circulatory and respiratory systems: Secondary | ICD-10-CM | POA: Diagnosis not present

## 2019-03-25 DIAGNOSIS — G8929 Other chronic pain: Secondary | ICD-10-CM | POA: Diagnosis not present

## 2019-04-17 DIAGNOSIS — M720 Palmar fascial fibromatosis [Dupuytren]: Secondary | ICD-10-CM | POA: Diagnosis not present

## 2019-04-18 DIAGNOSIS — Z5329 Procedure and treatment not carried out because of patient's decision for other reasons: Secondary | ICD-10-CM | POA: Insufficient documentation

## 2019-04-18 DIAGNOSIS — Z5320 Procedure and treatment not carried out because of patient's decision for unspecified reasons: Secondary | ICD-10-CM | POA: Insufficient documentation

## 2019-05-02 DIAGNOSIS — Z20822 Contact with and (suspected) exposure to covid-19: Secondary | ICD-10-CM | POA: Diagnosis not present

## 2019-06-19 DIAGNOSIS — N410 Acute prostatitis: Secondary | ICD-10-CM | POA: Diagnosis not present

## 2019-06-19 DIAGNOSIS — Z6837 Body mass index (BMI) 37.0-37.9, adult: Secondary | ICD-10-CM | POA: Diagnosis not present

## 2019-06-19 DIAGNOSIS — R3915 Urgency of urination: Secondary | ICD-10-CM | POA: Diagnosis not present

## 2019-06-19 DIAGNOSIS — N400 Enlarged prostate without lower urinary tract symptoms: Secondary | ICD-10-CM | POA: Diagnosis not present

## 2019-09-11 DIAGNOSIS — M2041 Other hammer toe(s) (acquired), right foot: Secondary | ICD-10-CM | POA: Diagnosis not present

## 2019-09-11 DIAGNOSIS — M2042 Other hammer toe(s) (acquired), left foot: Secondary | ICD-10-CM | POA: Diagnosis not present

## 2019-09-11 DIAGNOSIS — Z139 Encounter for screening, unspecified: Secondary | ICD-10-CM | POA: Diagnosis not present

## 2019-09-11 DIAGNOSIS — G629 Polyneuropathy, unspecified: Secondary | ICD-10-CM | POA: Diagnosis not present

## 2019-09-11 DIAGNOSIS — R0789 Other chest pain: Secondary | ICD-10-CM | POA: Diagnosis not present

## 2019-09-11 DIAGNOSIS — N400 Enlarged prostate without lower urinary tract symptoms: Secondary | ICD-10-CM | POA: Diagnosis not present

## 2019-09-11 DIAGNOSIS — G8929 Other chronic pain: Secondary | ICD-10-CM | POA: Diagnosis not present

## 2019-09-11 DIAGNOSIS — R7303 Prediabetes: Secondary | ICD-10-CM | POA: Diagnosis not present

## 2019-09-11 DIAGNOSIS — Z9181 History of falling: Secondary | ICD-10-CM | POA: Diagnosis not present

## 2019-09-11 DIAGNOSIS — Z6838 Body mass index (BMI) 38.0-38.9, adult: Secondary | ICD-10-CM | POA: Diagnosis not present

## 2019-09-11 DIAGNOSIS — Z1331 Encounter for screening for depression: Secondary | ICD-10-CM | POA: Diagnosis not present

## 2019-12-22 DIAGNOSIS — Z23 Encounter for immunization: Secondary | ICD-10-CM | POA: Diagnosis not present

## 2019-12-22 DIAGNOSIS — N390 Urinary tract infection, site not specified: Secondary | ICD-10-CM | POA: Diagnosis not present

## 2019-12-22 DIAGNOSIS — R0789 Other chest pain: Secondary | ICD-10-CM | POA: Diagnosis not present

## 2019-12-22 DIAGNOSIS — N400 Enlarged prostate without lower urinary tract symptoms: Secondary | ICD-10-CM | POA: Diagnosis not present

## 2019-12-22 DIAGNOSIS — G8929 Other chronic pain: Secondary | ICD-10-CM | POA: Diagnosis not present

## 2019-12-22 DIAGNOSIS — Z6838 Body mass index (BMI) 38.0-38.9, adult: Secondary | ICD-10-CM | POA: Diagnosis not present

## 2019-12-22 DIAGNOSIS — R079 Chest pain, unspecified: Secondary | ICD-10-CM | POA: Diagnosis not present

## 2020-01-07 DIAGNOSIS — H04122 Dry eye syndrome of left lacrimal gland: Secondary | ICD-10-CM | POA: Diagnosis not present

## 2020-01-30 DIAGNOSIS — Z6838 Body mass index (BMI) 38.0-38.9, adult: Secondary | ICD-10-CM | POA: Diagnosis not present

## 2020-01-30 DIAGNOSIS — Z1612 Extended spectrum beta lactamase (ESBL) resistance: Secondary | ICD-10-CM | POA: Diagnosis not present

## 2020-01-30 DIAGNOSIS — A499 Bacterial infection, unspecified: Secondary | ICD-10-CM | POA: Diagnosis not present

## 2020-02-03 ENCOUNTER — Ambulatory Visit: Payer: Self-pay | Admitting: Internal Medicine

## 2020-02-04 ENCOUNTER — Ambulatory Visit (INDEPENDENT_AMBULATORY_CARE_PROVIDER_SITE_OTHER): Payer: PPO | Admitting: Internal Medicine

## 2020-02-04 ENCOUNTER — Encounter: Payer: Self-pay | Admitting: Internal Medicine

## 2020-02-04 ENCOUNTER — Other Ambulatory Visit: Payer: Self-pay

## 2020-02-04 VITALS — BP 129/80 | HR 84 | Temp 98.6°F | Resp 16 | Ht 65.0 in | Wt 221.0 lb

## 2020-02-04 DIAGNOSIS — B9629 Other Escherichia coli [E. coli] as the cause of diseases classified elsewhere: Secondary | ICD-10-CM | POA: Insufficient documentation

## 2020-02-04 DIAGNOSIS — Z1612 Extended spectrum beta lactamase (ESBL) resistance: Secondary | ICD-10-CM | POA: Diagnosis not present

## 2020-02-04 DIAGNOSIS — R350 Frequency of micturition: Secondary | ICD-10-CM | POA: Diagnosis not present

## 2020-02-04 DIAGNOSIS — R569 Unspecified convulsions: Secondary | ICD-10-CM | POA: Insufficient documentation

## 2020-02-04 DIAGNOSIS — N390 Urinary tract infection, site not specified: Secondary | ICD-10-CM | POA: Diagnosis not present

## 2020-02-04 DIAGNOSIS — N401 Enlarged prostate with lower urinary tract symptoms: Secondary | ICD-10-CM

## 2020-02-04 NOTE — Progress Notes (Signed)
[facility]  Reason for Consult: ESBL UTI Referring Provider: Charlott Holler FNP  HPI:  Martin Hanna is a 64 y.o. male who presents to clinic for further evaluation of ESBL E coli isolated from his urine culture.     Mr Bolender has a history of BPH and lower urinary tract symptoms previously seen by urology.  His BPH is managed with alfuzosin at this time and he has not seen his urologist in about 2 years.  Regarding his current symptoms, he was initially seen by his primary care on 12/22/19 for approx 1 week of frequency, urgency, incontinence, and dysuria.  UA at that time was notable for small leuk esterase, positive nitrites and greater than 20 WBC on microscopy.  He was started on Cipro at that time.  Urine culture grew an ESBL E coli resistant to Cipro and he subsequently was changed to Amox-Clav which he took for 7-10 days.  He was no better with this treatment and so his course was extended for another 7 days.  This was ineffective so he then went back and finished the course of cipro on his own.  Symptoms still persisted.  Then seen again in PCP office by Charlott Holler, FNP due to continued symptoms.  UA this time with small leuk esterase and negative nitrites.  Notes indicated that rods still present on microscopy and appears culture was sent but I do not have record of this most recent culture.  He was subsequently referred to ID to consider IV therapy.   Today he reports similar symptoms and describes that he feels a great sense of urgency and bladder fullness but when he goes to urinate there is just a small amount of urine that dribbles out.  He also has frequent nocturia.    Patient does not have any fevers, chills, other signs/sympotms of systemic infection.  He has no CVA tenderness.  No suprapubic pain.  No deep pelvic pain or perineal discomfort. He has a history of sulfa allergy and normal kidney function as of last year.   Patient's Medications    New Prescriptions   No medications on file  Previous Medications   ALFUZOSIN (UROXATRAL) 10 MG 24 HR TABLET    Take 10 mg by mouth daily with breakfast.   ASPIRIN 81 MG TABLET    Take 81 mg by mouth daily.   CINNAMON PO    Take by mouth.   GERIATRIC MULTIVITAMINS-MINERALS (ELDERTONIC/GEVRABON) ELIX    Take 15 mLs by mouth daily.   NAPROXEN SODIUM (ANAPROX) 220 MG TABLET    Take 220 mg by mouth 2 (two) times daily with a meal.   OMEGA-3 FATTY ACIDS (FISH OIL) 1200 MG CAPS    Take by mouth.   VALSARTAN-HYDROCHLOROTHIAZIDE (DIOVAN-HCT) 160-25 MG TABLET    Take 1 tablet by mouth daily.  Modified Medications   No medications on file  Discontinued Medications   FLUTICASONE (VERAMYST) 27.5 MCG/SPRAY NASAL SPRAY    Place 2 sprays into the nose daily.   HYDROCODONE-ACETAMINOPHEN (NORCO/VICODIN) 5-325 MG TABLET    Take 1-2 tablets by mouth every 6 (six) hours as needed for moderate pain.   TRAMADOL (ULTRAM) 50 MG TABLET    Take by mouth every 6 (six) hours as needed.      Past Medical History:  Diagnosis Date  . Hypercholesteremia   . Hypertension   . Neuropathy   . Seizures (HCC)    x4 at 64  years old; none before or since that time  . Sleep apnea     Social History   Tobacco Use  . Smoking status: Never Smoker  . Smokeless tobacco: Never Used  Vaping Use  . Vaping Use: Never used  Substance Use Topics  . Alcohol use: No  . Drug use: No    No family history on file.  Allergies  Allergen Reactions  . Sulfamethoxazole Swelling  . Crestor [Rosuvastatin Calcium]     Causes muscle cramps  . Rosuvastatin Other (See Comments)    Other reaction(s): Cramps (ALLERGY/intolerance) Causes muscle cramps Causes muscle cramps   . Rosuvastatin Calcium Other (See Comments)    Causes muscle cramps    Review of Systems: Review of Systems  Constitutional: Negative for chills and fever.  Respiratory: Negative for cough and shortness of breath.   Cardiovascular: Positive for chest pain  (chronic MSK pain).  Gastrointestinal: Negative for abdominal pain, nausea and vomiting.  Genitourinary: Positive for frequency and urgency. Negative for dysuria and flank pain.  Musculoskeletal: Positive for back pain (low back pain after mowing lawn). Negative for myalgias.  All other systems reviewed and are negative.    Objective: Vitals:   02/04/20 0923  Resp: 16  Weight: 221 lb (100.2 kg)  Height: 5\' 5"  (1.651 m)     Body mass index is 36.78 kg/m.  Physical Exam Constitutional:      General: He is not in acute distress.    Appearance: Normal appearance.  HENT:     Head: Normocephalic and atraumatic.  Cardiovascular:     Rate and Rhythm: Normal rate and regular rhythm.  Pulmonary:     Effort: Pulmonary effort is normal. No respiratory distress.  Abdominal:     General: There is distension.     Palpations: Abdomen is soft. There is no mass.     Tenderness: There is no abdominal tenderness. There is no right CVA tenderness, left CVA tenderness, guarding or rebound.  Skin:    General: Skin is warm and dry.  Neurological:     General: No focal deficit present.     Mental Status: He is alert and oriented to person, place, and time.  Psychiatric:        Mood and Affect: Mood normal.        Behavior: Behavior normal.     Assessment and Plan: 1. Benign prostatic hyperplasia with urinary frequency 2. UTI due to extended-spectrum beta lactamase (ESBL) producing Escherichia coli  Discussed with patient that his symptoms are more c/w LUTS and not active urinary tract infection and that he likely has asymptomatic bacteruria/colonization.  He has no other infectious symptoms of UTI, pyelo or prostatitis.  Also discussed that the Augmentin he took for 2 weeks should have been susceptible and the fact he did not improve is c/w with LUTS as opposed to infection.  Discussed that continuing to add antibiotics would lead to further risk for developing MDRO infection.  I think that his  BPH symptoms are not being adequately treated and this would be most beneficial for him.  He is a former patient of Dr Diona Fanti at [facility] and would be interested in seeing him again if possible.   -- no further antibiotics at this time -- referral to urology -- RTC to ID PRN -- follow up with primary care  I spent greater than 45 minutes with the patient including greater than 50% of time in face to face counsel of the patient and/or in  coordination of their care.      Raynelle Highland for [facility]ious [facility] 02/04/2020, 9:29 AM

## 2020-02-04 NOTE — Patient Instructions (Signed)
Thank you for coming to see me today. It was a pleasure seeing you.  I do not think your symptoms are related to an infection but more likely your prostate.  I have placed a new referral for you to be seen by the urologist.  If you have any questions or concerns, please do not hesitate to call the office at [phone].  Take Care,   Jule Ser, DO

## 2020-02-05 DIAGNOSIS — R509 Fever, unspecified: Secondary | ICD-10-CM | POA: Diagnosis not present

## 2020-02-05 DIAGNOSIS — Z20822 Contact with and (suspected) exposure to covid-19: Secondary | ICD-10-CM | POA: Diagnosis not present

## 2020-02-06 ENCOUNTER — Other Ambulatory Visit: Payer: Self-pay

## 2020-02-06 ENCOUNTER — Encounter: Payer: Self-pay | Admitting: Emergency Medicine

## 2020-02-06 ENCOUNTER — Inpatient Hospital Stay
Admission: EM | Admit: 2020-02-06 | Discharge: 2020-02-10 | DRG: 872 | Disposition: A | Discharge status: Home Health | Payer: PPO | Attending: Obstetrics and Gynecology | Admitting: Obstetrics and Gynecology

## 2020-02-06 ENCOUNTER — Emergency Department: Payer: PPO

## 2020-02-06 DIAGNOSIS — E876 Hypokalemia: Secondary | ICD-10-CM | POA: Diagnosis present

## 2020-02-06 DIAGNOSIS — Z87440 Personal history of urinary (tract) infections: Secondary | ICD-10-CM | POA: Diagnosis not present

## 2020-02-06 DIAGNOSIS — G4733 Obstructive sleep apnea (adult) (pediatric): Secondary | ICD-10-CM | POA: Diagnosis not present

## 2020-02-06 DIAGNOSIS — B9620 Unspecified Escherichia coli [E. coli] as the cause of diseases classified elsewhere: Secondary | ICD-10-CM | POA: Diagnosis not present

## 2020-02-06 DIAGNOSIS — R188 Other ascites: Secondary | ICD-10-CM | POA: Diagnosis not present

## 2020-02-06 DIAGNOSIS — Z882 Allergy status to sulfonamides status: Secondary | ICD-10-CM | POA: Diagnosis not present

## 2020-02-06 DIAGNOSIS — I10 Essential (primary) hypertension: Secondary | ICD-10-CM | POA: Diagnosis present

## 2020-02-06 DIAGNOSIS — R7881 Bacteremia: Secondary | ICD-10-CM

## 2020-02-06 DIAGNOSIS — R509 Fever, unspecified: Secondary | ICD-10-CM | POA: Diagnosis not present

## 2020-02-06 DIAGNOSIS — Z1612 Extended spectrum beta lactamase (ESBL) resistance: Secondary | ICD-10-CM | POA: Diagnosis not present

## 2020-02-06 DIAGNOSIS — R110 Nausea: Secondary | ICD-10-CM | POA: Diagnosis not present

## 2020-02-06 DIAGNOSIS — N390 Urinary tract infection, site not specified: Secondary | ICD-10-CM | POA: Diagnosis present

## 2020-02-06 DIAGNOSIS — B9629 Other Escherichia coli [E. coli] as the cause of diseases classified elsewhere: Secondary | ICD-10-CM

## 2020-02-06 DIAGNOSIS — Z20822 Contact with and (suspected) exposure to covid-19: Secondary | ICD-10-CM | POA: Diagnosis present

## 2020-02-06 DIAGNOSIS — E7800 Pure hypercholesterolemia, unspecified: Secondary | ICD-10-CM | POA: Diagnosis present

## 2020-02-06 DIAGNOSIS — E669 Obesity, unspecified: Secondary | ICD-10-CM | POA: Diagnosis not present

## 2020-02-06 DIAGNOSIS — Z09 Encounter for follow-up examination after completed treatment for conditions other than malignant neoplasm: Secondary | ICD-10-CM

## 2020-02-06 DIAGNOSIS — Z888 Allergy status to other drugs, medicaments and biological substances status: Secondary | ICD-10-CM | POA: Diagnosis not present

## 2020-02-06 DIAGNOSIS — E785 Hyperlipidemia, unspecified: Secondary | ICD-10-CM | POA: Diagnosis not present

## 2020-02-06 DIAGNOSIS — Z6836 Body mass index (BMI) 36.0-36.9, adult: Secondary | ICD-10-CM

## 2020-02-06 DIAGNOSIS — Z7982 Long term (current) use of aspirin: Secondary | ICD-10-CM

## 2020-02-06 DIAGNOSIS — A498 Other bacterial infections of unspecified site: Secondary | ICD-10-CM | POA: Diagnosis not present

## 2020-02-06 DIAGNOSIS — N12 Tubulo-interstitial nephritis, not specified as acute or chronic: Secondary | ICD-10-CM

## 2020-02-06 DIAGNOSIS — N401 Enlarged prostate with lower urinary tract symptoms: Secondary | ICD-10-CM | POA: Diagnosis present

## 2020-02-06 DIAGNOSIS — Z79899 Other long term (current) drug therapy: Secondary | ICD-10-CM

## 2020-02-06 DIAGNOSIS — A419 Sepsis, unspecified organism: Secondary | ICD-10-CM | POA: Diagnosis not present

## 2020-02-06 DIAGNOSIS — A4151 Sepsis due to Escherichia coli [E. coli]: Secondary | ICD-10-CM | POA: Diagnosis not present

## 2020-02-06 DIAGNOSIS — B9689 Other specified bacterial agents as the cause of diseases classified elsewhere: Secondary | ICD-10-CM | POA: Diagnosis present

## 2020-02-06 DIAGNOSIS — G40909 Epilepsy, unspecified, not intractable, without status epilepticus: Secondary | ICD-10-CM | POA: Diagnosis not present

## 2020-02-06 DIAGNOSIS — K760 Fatty (change of) liver, not elsewhere classified: Secondary | ICD-10-CM | POA: Diagnosis not present

## 2020-02-06 LAB — URINALYSIS, COMPLETE (UACMP) WITH MICROSCOPIC
Bilirubin Urine: NEGATIVE
Glucose, UA: NEGATIVE mg/dL
Ketones, ur: 5 mg/dL — AB
Nitrite: POSITIVE — AB
Protein, ur: 100 mg/dL — AB
RBC / HPF: >50 RBC/hpf — ABNORMAL HIGH (ref 0–5)
Specific Gravity, Urine: 1.026 (ref 1.005–1.030)
WBC, UA: >50 WBC/hpf — ABNORMAL HIGH (ref 0–5)
pH: 5 (ref 5.0–8.0)

## 2020-02-06 LAB — RESPIRATORY PANEL BY RT PCR (FLU A&B, COVID)
Influenza A by PCR: NEGATIVE
Influenza B by PCR: NEGATIVE
SARS Coronavirus 2 by RT PCR: NEGATIVE

## 2020-02-06 LAB — CBC WITH DIFFERENTIAL/PLATELET
Abs Immature Granulocytes: 0.1 10*3/uL — ABNORMAL HIGH (ref 0.00–0.07)
Basophils Absolute: 0 10*3/uL (ref 0.0–0.1)
Basophils Relative: 0 %
Eosinophils Absolute: 0.1 10*3/uL (ref 0.0–0.5)
Eosinophils Relative: 0 %
HCT: 46.5 % (ref 39.0–52.0)
Hemoglobin: 15.9 g/dL (ref 13.0–17.0)
Immature Granulocytes: 1 %
Lymphocytes Relative: 3 %
Lymphs Abs: 0.7 10*3/uL (ref 0.7–4.0)
MCH: 29.7 pg (ref 26.0–34.0)
MCHC: 34.2 g/dL (ref 30.0–36.0)
MCV: 86.8 fL (ref 80.0–100.0)
Monocytes Absolute: 1.6 10*3/uL — ABNORMAL HIGH (ref 0.1–1.0)
Monocytes Relative: 8 %
Neutro Abs: 16.5 10*3/uL — ABNORMAL HIGH (ref 1.7–7.7)
Neutrophils Relative %: 88 %
Platelets: 193 10*3/uL (ref 150–400)
RBC: 5.36 MIL/uL (ref 4.22–5.81)
RDW: 13 % (ref 11.5–15.5)
WBC: 19 10*3/uL — ABNORMAL HIGH (ref 4.0–10.5)
nRBC: 0 % (ref 0.0–0.2)

## 2020-02-06 LAB — HIV ANTIBODY (ROUTINE TESTING W REFLEX): HIV Screen 4th Generation wRfx: NONREACTIVE

## 2020-02-06 LAB — COMPREHENSIVE METABOLIC PANEL
ALT: 23 U/L (ref 0–44)
AST: 22 U/L (ref 15–41)
Albumin: 3.8 g/dL (ref 3.5–5.0)
Alkaline Phosphatase: 52 U/L (ref 38–126)
Anion gap: 13 (ref 5–15)
BUN: 18 mg/dL (ref 8–23)
CO2: 25 mmol/L (ref 22–32)
Calcium: 8.6 mg/dL — ABNORMAL LOW (ref 8.9–10.3)
Chloride: 95 mmol/L — ABNORMAL LOW (ref 98–111)
Creatinine, Ser: 1.02 mg/dL (ref 0.61–1.24)
GFR, Estimated: >60 mL/min (ref >60)
Glucose, Bld: 204 mg/dL — ABNORMAL HIGH (ref 70–99)
Potassium: 3.8 mmol/L (ref 3.5–5.1)
Sodium: 133 mmol/L — ABNORMAL LOW (ref 135–145)
Total Bilirubin: 0.8 mg/dL (ref 0.3–1.2)
Total Protein: 7.6 g/dL (ref 6.5–8.1)

## 2020-02-06 LAB — PROTIME-INR
INR: 1.1 (ref 0.8–1.2)
Prothrombin Time: 13.5 seconds (ref 11.4–15.2)

## 2020-02-06 LAB — LACTIC ACID, PLASMA
Lactic Acid, Venous: 2 mmol/L (ref 0.5–1.9)
Lactic Acid, Venous: 3.3 mmol/L (ref 0.5–1.9)

## 2020-02-06 MED ORDER — ACETAMINOPHEN 325 MG PO TABS
650.0000 mg | ORAL_TABLET | Freq: Once | ORAL | Status: AC
  Administered 2020-02-06: 650 mg via ORAL
  Filled 2020-02-06: qty 2

## 2020-02-06 MED ORDER — SODIUM CHLORIDE 0.9 % IV SOLN: 2.0000 g | Freq: Once | INTRAVENOUS | Status: DC

## 2020-02-06 MED ORDER — LACTATED RINGERS IV BOLUS (SEPSIS)
1000.0000 mL | Freq: Once | INTRAVENOUS | Status: AC
  Administered 2020-02-06: 11:00:00 1000 mL via INTRAVENOUS

## 2020-02-06 MED ORDER — VALSARTAN-HYDROCHLOROTHIAZIDE 160-25 MG PO TABS: 1.0000 | ORAL_TABLET | Freq: Every day | ORAL | Status: DC

## 2020-02-06 MED ORDER — IRBESARTAN 150 MG PO TABS
150.0000 mg | ORAL_TABLET | Freq: Every day | ORAL | Status: DC
  Administered 2020-02-06 – 2020-02-07 (×2): 150 mg via ORAL
  Filled 2020-02-06 (×3): qty 1

## 2020-02-06 MED ORDER — ACETAMINOPHEN 325 MG PO TABS
650.0000 mg | ORAL_TABLET | Freq: Four times a day (QID) | ORAL | Status: DC | PRN
  Administered 2020-02-06 – 2020-02-10 (×7): 650 mg via ORAL
  Filled 2020-02-06 (×8): qty 2

## 2020-02-06 MED ORDER — LACTATED RINGERS IV SOLN: INTRAVENOUS | Status: AC

## 2020-02-06 MED ORDER — ALFUZOSIN HCL ER 10 MG PO TB24
10.0000 mg | ORAL_TABLET | Freq: Every day | ORAL | Status: DC
  Administered 2020-02-07 – 2020-02-10 (×4): 10 mg via ORAL
  Filled 2020-02-06 (×5): qty 1

## 2020-02-06 MED ORDER — ONDANSETRON HCL 4 MG/2ML IJ SOLN: 4.0000 mg | Freq: Four times a day (QID) | INTRAMUSCULAR | Status: DC | PRN

## 2020-02-06 MED ORDER — LACTATED RINGERS IV BOLUS (SEPSIS)
500.0000 mL | Freq: Once | INTRAVENOUS | Status: AC
Start: 2020-02-06 — End: 2020-02-06
  Administered 2020-02-06: 13:00:00 500 mL via INTRAVENOUS

## 2020-02-06 MED ORDER — LACTATED RINGERS IV BOLUS (SEPSIS)
1000.0000 mL | Freq: Once | INTRAVENOUS | Status: AC
  Administered 2020-02-06: 10:00:00 1000 mL via INTRAVENOUS

## 2020-02-06 MED ORDER — ELDERTONIC PO ELIX: 15.0000 mL | ORAL_SOLUTION | Freq: Every day | ORAL | Status: DC

## 2020-02-06 MED ORDER — SODIUM CHLORIDE 0.9 % IV SOLN
1.0000 g | Freq: Three times a day (TID) | INTRAVENOUS | Status: AC
  Administered 2020-02-06 – 2020-02-09 (×11): 1 g via INTRAVENOUS
  Filled 2020-02-06 (×13): qty 1

## 2020-02-06 MED ORDER — SODIUM CHLORIDE 0.9 % IV SOLN: INTRAVENOUS | Status: DC

## 2020-02-06 MED ORDER — HYDROCHLOROTHIAZIDE 25 MG PO TABS
25.0000 mg | ORAL_TABLET | Freq: Every day | ORAL | Status: DC
  Administered 2020-02-06 – 2020-02-07 (×2): 25 mg via ORAL
  Filled 2020-02-06 (×2): qty 1

## 2020-02-06 MED ORDER — ADULT MULTIVITAMIN LIQUID CH
15.0000 mL | Freq: Every day | ORAL | Status: DC
  Administered 2020-02-06: 15 mL via ORAL
  Filled 2020-02-06 (×2): qty 15

## 2020-02-06 MED ORDER — ELDERTONIC PO LIQD
15.0000 mL | Freq: Every day | ORAL | Status: DC
  Filled 2020-02-06: qty 15

## 2020-02-06 MED ORDER — OMEGA-3-ACID ETHYL ESTERS 1 G PO CAPS
1.0000 g | ORAL_CAPSULE | Freq: Every day | ORAL | Status: DC
  Administered 2020-02-06 – 2020-02-10 (×5): 1 g via ORAL
  Filled 2020-02-06 (×6): qty 1

## 2020-02-06 MED ORDER — ENOXAPARIN SODIUM 60 MG/0.6ML ~~LOC~~ SOLN
50.0000 mg | SUBCUTANEOUS | Status: DC
  Administered 2020-02-06 – 2020-02-09 (×3): 50 mg via SUBCUTANEOUS
  Filled 2020-02-06 (×8): qty 0.6

## 2020-02-06 MED ORDER — IOHEXOL 300 MG/ML  SOLN
100.0000 mL | Freq: Once | INTRAMUSCULAR | Status: AC | PRN
  Administered 2020-02-06: 100 mL via INTRAVENOUS

## 2020-02-06 MED ORDER — ACETAMINOPHEN 650 MG RE SUPP: 650.0000 mg | Freq: Four times a day (QID) | RECTAL | Status: DC | PRN

## 2020-02-06 MED ORDER — SODIUM CHLORIDE 0.9 % IV SOLN
2.0000 g | Freq: Once | INTRAVENOUS | Status: AC
  Administered 2020-02-06: 10:00:00 2 g via INTRAVENOUS
  Filled 2020-02-06: qty 2

## 2020-02-06 MED ORDER — ASPIRIN 81 MG PO CHEW
81.0000 mg | CHEWABLE_TABLET | Freq: Every day | ORAL | Status: DC
  Administered 2020-02-06 – 2020-02-07 (×2): 81 mg via ORAL
  Filled 2020-02-06 (×2): qty 1

## 2020-02-06 MED ORDER — ONDANSETRON HCL 4 MG PO TABS: 4.0000 mg | ORAL_TABLET | Freq: Four times a day (QID) | ORAL | Status: DC | PRN

## 2020-02-06 NOTE — Consult Note (Signed)
PHARMACY -  BRIEF ANTIBIOTIC NOTE   Pharmacy has received consult(s) for Cefepime from an ED provider.  The patient's profile has been reviewed for ht/wt/allergies/indication/available labs.    One time order(s) placed for Cefepime 2g IV x 1  Further antibiotics/pharmacy consults should be ordered by admitting physician if indicated.                       Thank you,  Lu Duffel, PharmD, BCPS Clinical Pharmacist 02/06/2020 9:15 AM

## 2020-02-06 NOTE — ED Notes (Signed)
First Nurse Note: Pt here for fever and dry heaves. Pt tested for COVID yesterday but results are not back. Pt seen infectious disease MD 2 days ago for possible prostate infection and was supposed to see urologist yesterday but he did not go because he was too sick. Pt is in NAD.

## 2020-02-06 NOTE — Progress Notes (Signed)
PHARMACIST - PHYSICIAN COMMUNICATION  CONCERNING:  Enoxaparin (Lovenox) for DVT Prophylaxis    RECOMMENDATION: Patient was prescribed enoxaprin 40mg  q24 hours for VTE prophylaxis.   Filed Weights   02/06/20 0811  Weight: 100.2 kg (221 lb)    Body mass index is 36.78 kg/m.  Estimated Creatinine Clearance: 77.6 mL/min (by C-G formula based on SCr of 1.02 mg/dL).   Based on [place] patient is candidate for enoxaparin 0.5mg /kg TBW SQ every 24 hours based on BMI being >30.  DESCRIPTION: Pharmacy has adjusted enoxaparin dose per [facility] policy.  Patient is now receiving enoxaparin 50 mg every 24 hours    Jan Walters, PharmD Clinical Pharmacist  02/06/2020 11:34 AM

## 2020-02-06 NOTE — Consult Note (Signed)
CODE SEPSIS - PHARMACY COMMUNICATION  **Broad Spectrum Antibiotics should be administered within 1 hour of Sepsis diagnosis**  Time Code Sepsis Called/Page Received: 0908  Antibiotics Ordered: Cefepime  Time of 1st antibiotic administration: 0939  Additional action taken by pharmacy: none  If necessary, Name of Provider/Nurse Contacted: [place] ,PharmD Clinical Pharmacist  02/06/2020  9:15 AM

## 2020-02-06 NOTE — Consult Note (Signed)
Urology Consult  I have been asked to see the patient by Dr. Francine Graven, for evaluation and management of sepsis of urinary origin with left acute lobar nephronia.  Chief Complaint: Fever, nausea, vomiting  History of Present Illness: Martin Hanna is a 64 y.o. year old male with a history of BPH with LUTS on alfuzosin previously managed by Dr. Diona Fanti at [facility] who presented to the ED this morning with reports of a 3-day history of nausea, fever and cough in the setting of multiple recent rounds of antibiotics for treatment of urinary symptoms including frequency, urgency, urinary incontinence, and dysuria with ESBL E coli-positive urine culture.  Patient initially sought care with his PCP on 12/22/2019 with reports of a 1 week history of frequency, urgency, urinary incontinence, and dysuria.  UA notable for nitrites and >20 WBCs/hpf at that time and he was treated with Cipro with urine culture ultimately growing ESBL E. coli.  Antibiotics were switched to Augmentin for a total of approximately 14 days of therapy when his symptoms did not improve.  He was subsequently referred to ID for consideration of IV antibiotics.  He was seen by Dr. Juleen China on 02/04/2020.  It was felt that his symptoms are more consistent with LUTS with underlying asymptomatic bacteriuria not requiring further antibiotic therapy.  Admission labs notable for lactate 2.0 (now 3.3); WBC count 19.0; creatinine 1.02 (baseline 0.9-1.0); UA with nitrites, >50 WBCs/hpf, >50 RBCs/hpf, many bacteria, and WBC clumps; COVID negative.  Blood cultures pending, no urine culture sent.  On antibiotics as below.  He has had a stable low-grade fever around 100F, been normotensive to hypertensive, and heart rate largely WNL.  CTAP with contrast revealed areas of inhomogenous enhancement of the left kidney with associated perinephric stranding consistent with bacterial nephritis versus acute lobar nephronia.  Today he reports a  month-long history of worsened lower urinary tract symptoms including difficulty urinating and a sensation of incomplete bladder emptying.  He states he has not had any pain with urination but was mostly bothered by fever over the past 3 days.  He would like to follow-up outpatient with Dr. Diona Fanti.  Anti-infectives (From admission, onward)   Start     Dose/Rate Route Frequency Ordered Stop   02/06/20 1400  meropenem (MERREM) 1 g in sodium chloride 0.9 % 100 mL IVPB        1 g 200 mL/hr over 30 Minutes Intravenous Every 8 hours 02/06/20 1200     02/06/20 1130  ceFEPIme (MAXIPIME) 2 g in sodium chloride 0.9 % 100 mL IVPB  Status:  Discontinued        2 g 200 mL/hr over 30 Minutes Intravenous  Once 02/06/20 1116 02/06/20 1128   02/06/20 0915  ceFEPIme (MAXIPIME) 2 g in sodium chloride 0.9 % 100 mL IVPB        2 g 200 mL/hr over 30 Minutes Intravenous  Once 02/06/20 0907 02/06/20 1016      Past Medical History:  Diagnosis Date  . Hypercholesteremia   . Hypertension   . Neuropathy   . Seizures ([place])    x63 at 64 years old; none before or since that time  . Sleep apnea     Past Surgical History:  Procedure Laterality Date  . APPENDECTOMY    . CHOLECYSTECTOMY    . COLONOSCOPY N/A 02/12/2015   Procedure: COLONOSCOPY;  Surgeon: Manya Silvas, MD;  Location: [facility] END[facility];  Service: Endoscopy;  Laterality: N/A;  . GANGLION CYST EXCISION    .  HERNIA REPAIR    . TYMPANOPLASTY Left 09/15/2016   Procedure: LEFT SIDE TYMPANOPLASTY;  Surgeon: Rozetta Nunnery, MD;  Location: [place];  Service: ENT;  Laterality: Left;    Home Medications:  Current Meds  Medication Sig  . alfuzosin (UROXATRAL) 10 MG 24 hr tablet Take 10 mg by mouth daily with breakfast.  . aspirin 81 MG tablet Take 81 mg by mouth daily.  Marland Kitchen CINNAMON PO Take by mouth.  . Multiple Vitamins-Minerals (MULTIVITAMIN WITH MINERALS) tablet Take 1 tablet by mouth daily.  . Omega-3 Fatty Acids (FISH OIL)  1200 MG CAPS Take 1,200 mg by mouth daily.   . valsartan-hydrochlorothiazide (DIOVAN-HCT) 160-25 MG tablet Take 1 tablet by mouth daily.    Allergies:  Allergies  Allergen Reactions  . Sulfamethoxazole Swelling  . Crestor [Rosuvastatin Calcium]     Causes muscle cramps  . Rosuvastatin Other (See Comments)    Other reaction(s): Cramps (ALLERGY/intolerance) Causes muscle cramps Causes muscle cramps   . Rosuvastatin Calcium Other (See Comments)    Causes muscle cramps    History reviewed. No pertinent family history.  Social History:  reports that he has never smoked. He has never used smokeless tobacco. He reports that he does not drink alcohol and does not use drugs.  ROS: A complete review of systems was performed.  All systems are negative except for pertinent findings as noted.  Physical Exam:  Vital signs in last 24 hours: Temp:  [100 F (37.8 C)-100.3 F (37.9 C)] 100.3 F (37.9 C) (11/12 1455) Pulse Rate:  [57-105] 57 (11/12 1500) Resp:  [18-24] 18 (11/12 1005) BP: (128-188)/(73-87) 170/77 (11/12 1500) SpO2:  [83 %-96 %] 83 % (11/12 1500) Weight:  [100.2 kg] 100.2 kg (11/12 0811) Constitutional:  Alert and oriented, no acute distress HEENT: [place] AT, moist mucus membranes Cardiovascular: No clubbing, cyanosis, or edema. Respiratory: Normal respiratory effort Skin: No rashes, bruises or suspicious lesions Neurologic: Grossly intact, no focal deficits, moving all 4 extremities Psychiatric: Normal mood and affect  Laboratory Data:  Recent Labs    02/06/20 0821  WBC 19.0*  HGB 15.9  HCT 46.5   Recent Labs    02/06/20 0821  NA 133*  K 3.8  CL 95*  CO2 25  GLUCOSE 204*  BUN 18  CREATININE 1.02  CALCIUM 8.6*   Recent Labs    02/06/20 0821  INR 1.1   Urinalysis    Component Value Date/Time   COLORURINE AMBER (A) 02/06/2020 0824   APPEARANCEUR CLOUDY (A) 02/06/2020 0824   LABSPEC 1.026 02/06/2020 0824   PHURINE 5.0 02/06/2020 0824   GLUCOSEU  NEGATIVE 02/06/2020 0824   HGBUR LARGE (A) 02/06/2020 0824   BILIRUBINUR NEGATIVE 02/06/2020 0824   KETONESUR 5 (A) 02/06/2020 0824   PROTEINUR 100 (A) 02/06/2020 0824   NITRITE POSITIVE (A) 02/06/2020 0824   LEUKOCYTESUR MODERATE (A) 02/06/2020 0824   Results for orders placed or performed during the hospital encounter of 02/06/20  Respiratory Panel by RT PCR (Flu A&B, Covid) - Nasopharyngeal Swab     Status: None   Collection Time: 02/06/20  9:29 AM   Specimen: Nasopharyngeal Swab  Result Value Ref Range Status   SARS Coronavirus 2 by RT PCR NEGATIVE NEGATIVE Final    Comment: (NOTE) SARS-CoV-2 target nucleic acids are NOT DETECTED.  The SARS-CoV-2 RNA is generally detectable in upper respiratoy specimens during the acute phase of infection. The lowest concentration of SARS-CoV-2 viral copies this assay can detect is 131 copies/mL.  A negative result does not preclude SARS-Cov-2 infection and should not be used as the sole basis for treatment or other patient management decisions. A negative result may occur with  improper specimen collection/handling, submission of specimen other than nasopharyngeal swab, presence of viral mutation(s) within the areas targeted by this assay, and inadequate number of viral copies (<131 copies/mL). A negative result must be combined with clinical observations, patient history, and epidemiological information. The expected result is Negative.  Fact Sheet for Patients:  PinkCheek.be  Fact Sheet for Healthcare Providers:  GravelBags.it  This test is no t yet approved or cleared by the [place] FDA and  has been authorized for detection and/or diagnosis of SARS-CoV-2 by FDA under an Emergency Use Authorization (EUA). This EUA will remain  in effect (meaning this test can be used) for the duration of the COVID-19 declaration under Section 564(b)(1) of the Act, 21 U.S.C. section  360bbb-3(b)(1), unless the authorization is terminated or revoked sooner.     Influenza A by PCR NEGATIVE NEGATIVE Final   Influenza B by PCR NEGATIVE NEGATIVE Final    Comment: (NOTE) The Xpert Xpress SARS-CoV-2/FLU/RSV assay is intended as an aid in  the diagnosis of influenza from Nasopharyngeal swab specimens and  should not be used as a sole basis for treatment. Nasal washings and  aspirates are unacceptable for Xpert Xpress SARS-CoV-2/FLU/RSV  testing.  Fact Sheet for Patients: PinkCheek.be  Fact Sheet for Healthcare Providers: GravelBags.it  This test is not yet approved or cleared by the [place] FDA and  has been authorized for detection and/or diagnosis of SARS-CoV-2 by  FDA under an Emergency Use Authorization (EUA). This EUA will remain  in effect (meaning this test can be used) for the duration of the  Covid-19 declaration under Section 564(b)(1) of the Act, 21  U.S.C. section 360bbb-3(b)(1), unless the authorization is  terminated or revoked. Performed at [facility], [address], [place], [place] [address]     Radiologic Imaging: CT Abdomen Pelvis W Contrast  Result Date: 02/06/2020 CLINICAL DATA:  Hematuria with nausea and fever EXAM: CT ABDOMEN AND PELVIS WITH CONTRAST TECHNIQUE: Multidetector CT imaging of the abdomen and pelvis was performed using the standard protocol following bolus administration of intravenous contrast. CONTRAST:  168mL OMNIPAQUE IOHEXOL 300 MG/ML  SOLN COMPARISON:  Jul 27, 2017 FINDINGS: Lower chest: There is mild scarring in the lung bases. There is no lung base edema or consolidation. There are foci of coronary artery calcification. There is a small hiatal hernia. Hepatobiliary: There is hepatic steatosis. There are scattered subcentimeter cysts in the liver. Gallbladder is absent. There is no appreciable biliary duct dilatation given post cholecystectomy state. No  biliary duct mass or calculus evident. Pancreas: No pancreatic mass or inflammatory focus. Spleen: No splenic lesions are evident. Adrenals/Urinary Tract: Adrenals bilaterally appear normal. There is no well-defined renal mass. Note that there are areas of decreased enhancement in portions of the mid and posterior aspects of the kidney involving portions of each upper and lower pole region on the left without associated air. There is associated perinephric stranding on the left. The right kidney shows no changes of this nature. There is no hydronephrosis on either side. There is no evident renal or ureteral calculus on either side. Urinary bladder is midline with wall thickness within normal limits. Stomach/Bowel: There is no appreciable bowel wall or mesenteric thickening. There are multiple descending colon and sigmoid diverticula without evident diverticulitis. Terminal ileum appears normal. There is no  demonstrable bowel obstruction. There is no free air or portal venous air. Vascular/Lymphatic: There is no abdominal aortic aneurysm. There is aortic and common iliac artery atherosclerotic calcification. Major venous structures appear patent. There is no appreciable adenopathy in the abdomen or pelvis. Reproductive: Occasional prostatic calculi noted. Prostate and seminal vesicles normal in size and contour. Other: The appendix appears normal. There is no evident abscess apart from inflammatory change involving the left kidney or ascites in the abdomen or pelvis. Fat noted in each inguinal ring. Musculoskeletal: There are foci of degenerative change in the lumbar spine. Focal spinal stenosis is noted at L4-5 due to bony hypertrophy and disc protrusion. No blastic or lytic bone lesions are evident. No intramuscular lesions are appreciable. IMPRESSION: 1. Areas of inhomogeneous enhancement in the left kidney as well as perinephric stranding. No well-defined fluid in the perinephric fascia on the left. This  appearance is felt to be indicative of bacterial nephritis/acute lobar nephronia. Appropriate laboratory assessment in this regard advised. 2. No hydronephrosis on either side. No evident renal or ureteral calculus on either side. Urinary bladder wall thickness normal. 3. Multiple descending colonic and sigmoid diverticula without diverticulitis. No bowel wall thickening or bowel obstruction. No abscess in the abdomen or pelvis. Appendix appears normal. 4.  Hepatic steatosis. 5.  Gallbladder absent. 6. Aortic Atherosclerosis (ICD10-I70.0). Foci of coronary artery calcification noted. 7.  Small hiatal hernia. 8. Degree of spinal stenosis at L4-5 due to bony hypertrophy and disc protrusion. Electronically Signed   By: Lowella Grip III M.D.   On: 02/06/2020 10:11   Assessment & Plan:  64 year old male with a history of BPH with LUTS on alfuzosin and a recent history of worsened lower urinary tract symptoms despite multiple courses of antibiotics with a recent history of ESBL E. coli urine culture admitted with left pyelonephritis and sepsis of urinary source.  Agree with antibiotic therapy with plans for repeat imaging in 48-72 hours if he is not clinically improving and remains febrile to evaluate for possible perinephric abscess development.  No notable bladder distention on CT today, however I recommend serial bladder scans given his reports of worsened difficulty urinating since admission.  Ultimately, he will require outpatient follow-up with Dr. Diona Fanti with consideration for possible bladder outlet procedure given UTI in the setting of BPH with worsened LUTS.  Recommendations: -Agree with fluid resuscitation and empiric antibiotics for total of 14 days of culture appropriate therapy (add on urine culture ordered today) -Continue alfuzosin -Daily bladder scans to evaluate for incomplete bladder emptying.  Place Foley catheter for PVR >477mL -Repeat CTAP with contrast in 48 to 72 hours if he  remains febrile -Outpatient follow-up with Dr. Diona Fanti  Thank you for involving me in this patient's care, I will continue to follow along.  Debroah Loop, PA-C 02/06/2020 3:55 PM

## 2020-02-06 NOTE — H&P (Signed)
History and Physical    ISSAIC WELLIVER AVW:[id number] DOB: Mar 27, 1956 DOA: 02/06/2020  PCP: Cyndi Bender, PA-C   Patient coming from: Home  I have personally briefly reviewed patient's old medical records in [place]  Chief Complaint: Fever  HPI: Martin Hanna is a 64 y.o. male with medical history significant for hypertension, obesity, dyslipidemia, prostate disease and sleep apnea presents to the emergency room for evaluation of a fever which he has had intermittently for the last 3 days.  Patient states that he has been treated with antibiotics as an outpatient for UTI and appears to have completed a course of ciprofloxacin and Augmentin without improvement in his symptoms.  He was recently seen by an infectious disease physician on 02/04/20 for ESBL positive E. coli in his urine but was not placed on any further antibiotic therapy.  Patient complains of a fever with a T-max of 103 F associated with feeling of incomplete voiding, poor urine stream and urinary dribbling.  He denies having any frequency, dysuria or nocturia. He was referred to urology but was not able to keep that appointment because he did not feel well.  He also complains of nausea and poor oral intake but denies having any vomiting, no abdominal pain, no changes in his bowel habits, no dizziness, no lightheadedness, no shortness of breath, no chest pain, no palpitations. Labs show sodium one thirty-three, potassium 3.8, chloride ninety-five, bicarb twenty-five, glucose two oh four, BUN eighteen, creatinine 1.02, calcium 8.6, alkaline phosphatase fifty-two, albumin 3.8, AST twenty-two, ALT twenty-three, total protein 7.6, lactic acid 2.0, white count 19.0, hemoglobin 15.9, hematocrit 46.5, MCV 86.8, RDW 13.0, platelet count 193, PT 13.5, INR 1.1 Respiratory viral panel is negative Urine analysis is positive for nitrites and leukocyte esterase with many bacteria. CT scan of abdomen and pelvis shows areas of  inhomogeneous enhancement in the left kidney as well as perinephric stranding. No well-defined fluid in the perinephric fascia on the left. This appearance is felt to be indicative of bacterial nephritis/acute lobar nephronia. Appropriate laboratory assessment in this regard advised. No hydronephrosis on either side. No evident renal or ureteral calculus on either side. Urinary bladder wall thickness normal.    ED Course: Patient is a 64 year old Caucasian male who presents to the ER for evaluation of a fever and is noted to be septic from a urinary source.  Patient has failed outpatient antibiotic therapy with ciprofloxacin and Augmentin and had a urine culture which yielded ESBL positive E. coli.  He received sepsis IV fluid requirement in the ER and a dose of cefepime and will be admitted to the hospital for further evaluation.  Review of Systems: As per HPI otherwise 10 point review of systems negative.    Past Medical History:  Diagnosis Date  . Hypercholesteremia   . Hypertension   . Neuropathy   . Seizures ([place])    x39 at 64 years old; none before or since that time  . Sleep apnea     Past Surgical History:  Procedure Laterality Date  . APPENDECTOMY    . CHOLECYSTECTOMY    . COLONOSCOPY N/A 02/12/2015   Procedure: COLONOSCOPY;  Surgeon: Manya Silvas, MD;  Location: [facility] -  ENDOSCOPY;  Service: Endoscopy;  Laterality: N/A;  . GANGLION CYST EXCISION    . HERNIA REPAIR    . TYMPANOPLASTY Left 09/15/2016   Procedure: LEFT SIDE TYMPANOPLASTY;  Surgeon: Rozetta Nunnery, MD;  Location: [place];  Service: ENT;  Laterality: Left;  reports that he has never smoked. He has never used smokeless tobacco. He reports that he does not drink alcohol and does not use drugs.  Allergies  Allergen Reactions  . Sulfamethoxazole Swelling  . Crestor [Rosuvastatin Calcium]     Causes muscle cramps  . Rosuvastatin Other (See Comments)    Other reaction(s): Cramps  (ALLERGY/intolerance) Causes muscle cramps Causes muscle cramps   . Rosuvastatin Calcium Other (See Comments)    Causes muscle cramps    History reviewed. No pertinent family history.   Prior to Admission medications   Medication Sig Start Date End Date Taking? Authorizing Provider  alfuzosin (UROXATRAL) 10 MG 24 hr tablet Take 10 mg by mouth daily with breakfast.    [provider]  aspirin 81 MG tablet Take 81 mg by mouth daily.    [provider]  CINNAMON PO Take by mouth.    [provider]  geriatric multivitamins-minerals (ELDERTONIC/GEVRABON) ELIX Take 15 mLs by mouth daily.    [provider]  naproxen sodium (ANAPROX) 220 MG tablet Take 220 mg by mouth 2 (two) times daily with a meal.    [provider]  Omega-3 Fatty Acids (FISH OIL) 1200 MG CAPS Take by mouth.    [provider]  valsartan-hydrochlorothiazide (DIOVAN-HCT) 160-25 MG tablet Take 1 tablet by mouth daily.    [provider]    Physical Exam: Vitals:   02/06/20 0813 02/06/20 0827 02/06/20 1005 02/06/20 1030  BP:   128/81 138/73  Pulse:   81 83  Resp:   18   Temp:      TempSrc:      SpO2: 91% 95% 96% 92%  Weight:      Height:         Vitals:   02/06/20 0813 02/06/20 0827 02/06/20 1005 02/06/20 1030  BP:   128/81 138/73  Pulse:   81 83  Resp:   18   Temp:      TempSrc:      SpO2: 91% 95% 96% 92%  Weight:      Height:        Constitutional: NAD, alert and oriented x 3 Eyes: PERRL, lids and conjunctivae normal ENMT: Mucous membranes are moist.  Neck: normal, supple, no masses, no thyromegaly Respiratory: clear to auscultation bilaterally, no wheezing, no crackles. Normal respiratory effort. No accessory muscle use.  Cardiovascular: Regular rate and rhythm, no murmurs / rubs / gallops. No extremity edema. 2+ pedal pulses. No carotid bruits.  Abdomen: Left lumbar tenderness, no masses palpated. No hepatosplenomegaly. Bowel sounds  positive.  Central adiposity Musculoskeletal: no clubbing / cyanosis. No joint deformity upper and lower extremities.  Skin: no rashes, lesions, ulcers.  Neurologic: No gross focal neurologic deficit. Psychiatric: Normal mood and affect.   Labs on Admission: I have personally reviewed following labs and imaging studies  CBC: Recent Labs  Lab 02/06/20 0821  WBC 19.0*  NEUTROABS 16.5*  HGB 15.9  HCT 46.5  MCV 86.8  PLT 235   Basic Metabolic Panel: Recent Labs  Lab 02/06/20 0821  NA 133*  K 3.8  CL 95*  CO2 25  GLUCOSE 204*  BUN 18  CREATININE 1.02  CALCIUM 8.6*   GFR: Estimated Creatinine Clearance: 77.6 mL/min (by C-G formula based on SCr of 1.02 mg/dL). Liver Function Tests: Recent Labs  Lab 02/06/20 0821  AST 22  ALT 23  ALKPHOS 52  BILITOT 0.8  PROT 7.6  ALBUMIN 3.8   No results for input(s):  LIPASE, AMYLASE in the last 168 hours. No results for input(s): AMMONIA in the last 168 hours. Coagulation Profile: Recent Labs  Lab 02/06/20 0821  INR 1.1   Cardiac Enzymes: No results for input(s): CKTOTAL, CKMB, CKMBINDEX, TROPONINI in the last 168 hours. BNP (last 3 results) No results for input(s): PROBNP in the last 8760 hours. HbA1C: No results for input(s): HGBA1C in the last 72 hours. CBG: No results for input(s): GLUCAP in the last 168 hours. Lipid Profile: No results for input(s): CHOL, HDL, LDLCALC, TRIG, CHOLHDL, LDLDIRECT in the last 72 hours. Thyroid Function Tests: No results for input(s): TSH, T4TOTAL, FREET4, T3FREE, THYROIDAB in the last 72 hours. Anemia Panel: No results for input(s): VITAMINB12, FOLATE, FERRITIN, TIBC, IRON, RETICCTPCT in the last 72 hours. Urine analysis:    Component Value Date/Time   COLORURINE AMBER (A) 02/06/2020 0824   APPEARANCEUR CLOUDY (A) 02/06/2020 0824   LABSPEC 1.026 02/06/2020 0824   PHURINE 5.0 02/06/2020 0824   GLUCOSEU NEGATIVE 02/06/2020 0824   HGBUR LARGE (A) 02/06/2020 0824   BILIRUBINUR  NEGATIVE 02/06/2020 0824   KETONESUR 5 (A) 02/06/2020 0824   PROTEINUR 100 (A) 02/06/2020 0824   NITRITE POSITIVE (A) 02/06/2020 0824   LEUKOCYTESUR MODERATE (A) 02/06/2020 0824    Radiological Exams on Admission: DG Chest 2 View  Result Date: 02/06/2020 CLINICAL DATA:  Fever and nausea EXAM: CHEST - 2 VIEW COMPARISON:  December 22, 2019 FINDINGS: There is a focal area of airspace opacity in the anterior left base. Lungs elsewhere are clear. Heart size and pulmonary vascularity normal. No adenopathy. There is aortic atherosclerosis. No bone lesions. IMPRESSION: Focal airspace opacity concerning for pneumonia anterior left base. Lungs elsewhere clear. Cardiac silhouette normal. No adenopathy. Aortic Atherosclerosis (ICD10-I70.0). Followup PA and lateral chest radiographs recommended in 3-4 weeks following trial of antibiotic therapy to ensure resolution and exclude underlying malignancy. Electronically Signed   By: Lowella Grip III M.D.   On: 02/06/2020 09:02   CT Abdomen Pelvis W Contrast  Result Date: 02/06/2020 CLINICAL DATA:  Hematuria with nausea and fever EXAM: CT ABDOMEN AND PELVIS WITH CONTRAST TECHNIQUE: Multidetector CT imaging of the abdomen and pelvis was performed using the standard protocol following bolus administration of intravenous contrast. CONTRAST:  128mL OMNIPAQUE IOHEXOL 300 MG/ML  SOLN COMPARISON:  Jul 27, 2017 FINDINGS: Lower chest: There is mild scarring in the lung bases. There is no lung base edema or consolidation. There are foci of coronary artery calcification. There is a small hiatal hernia. Hepatobiliary: There is hepatic steatosis. There are scattered subcentimeter cysts in the liver. Gallbladder is absent. There is no appreciable biliary duct dilatation given post cholecystectomy state. No biliary duct mass or calculus evident. Pancreas: No pancreatic mass or inflammatory focus. Spleen: No splenic lesions are evident. Adrenals/Urinary Tract: Adrenals bilaterally  appear normal. There is no well-defined renal mass. Note that there are areas of decreased enhancement in portions of the mid and posterior aspects of the kidney involving portions of each upper and lower pole region on the left without associated air. There is associated perinephric stranding on the left. The right kidney shows no changes of this nature. There is no hydronephrosis on either side. There is no evident renal or ureteral calculus on either side. Urinary bladder is midline with wall thickness within normal limits. Stomach/Bowel: There is no appreciable bowel wall or mesenteric thickening. There are multiple descending colon and sigmoid diverticula without evident diverticulitis. Terminal ileum appears normal. There is no demonstrable bowel obstruction. There  is no free air or portal venous air. Vascular/Lymphatic: There is no abdominal aortic aneurysm. There is aortic and common iliac artery atherosclerotic calcification. Major venous structures appear patent. There is no appreciable adenopathy in the abdomen or pelvis. Reproductive: Occasional prostatic calculi noted. Prostate and seminal vesicles normal in size and contour. Other: The appendix appears normal. There is no evident abscess apart from inflammatory change involving the left kidney or ascites in the abdomen or pelvis. Fat noted in each inguinal ring. Musculoskeletal: There are foci of degenerative change in the lumbar spine. Focal spinal stenosis is noted at L4-5 due to bony hypertrophy and disc protrusion. No blastic or lytic bone lesions are evident. No intramuscular lesions are appreciable. IMPRESSION: 1. Areas of inhomogeneous enhancement in the left kidney as well as perinephric stranding. No well-defined fluid in the perinephric fascia on the left. This appearance is felt to be indicative of bacterial nephritis/acute lobar nephronia. Appropriate laboratory assessment in this regard advised. 2. No hydronephrosis on either side. No  evident renal or ureteral calculus on either side. Urinary bladder wall thickness normal. 3. Multiple descending colonic and sigmoid diverticula without diverticulitis. No bowel wall thickening or bowel obstruction. No abscess in the abdomen or pelvis. Appendix appears normal. 4.  Hepatic steatosis. 5.  Gallbladder absent. 6. Aortic Atherosclerosis (ICD10-I70.0). Foci of coronary artery calcification noted. 7.  Small hiatal hernia. 8. Degree of spinal stenosis at L4-5 due to bony hypertrophy and disc protrusion. Electronically Signed   By: Lowella Grip III M.D.   On: 02/06/2020 10:11    EKG: Independently reviewed.   Assessment/Plan Principal Problem:   Sepsis ([place]) Active Problems:   Essential hypertension   UTI due to extended-spectrum beta lactamase (ESBL) producing Escherichia coli   Hypercholesteremia   Bacterial nephritis     Sepsis from a urinary source (POA) As evidenced by fever with a T-max of 100F, tachycardia and tachypnea with source of sepsis from the urinary system associated with elevated lactic acid and marked leukocytosis of 19,000 with a left shift Patient has a history of ESBL positive E. coli in his urine and was treated as an outpatient with oral antibiotics and appears to have failed outpatient treatment We will place patient on meropenem per pharmacy dosing until urine culture results become available Follow-up results of blood culture IV fluid resuscitation    Bacterial nephritis Noted on CT scan of the abdomen and pelvis in a patient who appears to have failed outpatient therapy for ESBL E. coli UTI Discussed with urologist, Dr Erlene Quan who recommends to continue IV antibiotic therapy for now unless patient continues spiking fevers in 48 to 72 hours, she recommends repeating imaging to rule out an abscess. Urology may be consulted at that time for further recommendation   Hypertension Continue valsartan/HCTZ   Dyslipidemia Continue Lovaza   History  of BPH Continue Uroxatrol Follow-up with urology as an outpatient   Obesity (BMI 36) Complicates overall prognosis and care    DVT prophylaxis: Lovenox Code Status: Full code Family Communication: Greater than 50% of time was spent discussing plan of care with patient at the bedside.  All questions and concerns have been addressed.  He verbalizes understanding and agrees with the plan. Disposition Plan: Back to previous home environment Consults called: Urology    Collier Bullock MD [facility]     02/06/2020, 11:37 AM

## 2020-02-06 NOTE — ED Provider Notes (Signed)
[facility] Emergency Department Provider Note   ____________________________________________   First MD Initiated Contact with Patient 02/06/20 [phone]     (approximate)  I have reviewed the triage vital signs and the nursing notes.   HISTORY  Chief Complaint Fever and Nausea    HPI Martin Hanna is a 63 y.o. male with a past medical history of hypertension, neuropathy, and epilepsy who presents for fever, nausea, and dysuria that has been worsening over the last 3 days.  Patient states that he is currently being treated for a kidney infection by infectious disease but symptoms have been worsening despite him being compliant with his antibiotic regimen.  Patient also states that he is being treated for a bacterial prostate infection and took a Covid test yesterday but the results are pending at this time.  Patient states that he had was scheduled to follow-up with a urologist yesterday but did not make it due to feeling too sick.         Past Medical History:  Diagnosis Date  . Hypercholesteremia   . Hypertension   . Neuropathy   . Seizures ([place])    x58 at 64 years old; none before or since that time  . Sleep apnea     Patient Active Problem List   Diagnosis Date Noted  . Sepsis ([place]) 02/06/2020  . Hypercholesteremia   . Bacterial nephritis   . Seizures ([place]) 02/04/2020  . UTI due to extended-spectrum beta lactamase (ESBL) producing Escherichia coli 02/04/2020  . Refusal of statin medication by patient 04/18/2019  . Nocturia more than twice per night 03/25/2019  . Benign paroxysmal positional vertigo 02/11/2019  . Chronic chest wall pain 01/02/2018  . Labile hypertension 09/03/2017  . Benign prostatic hyperplasia with urinary frequency 08/31/2017  . Intertrigo 08/31/2017  . Closed fracture of manubrium with routine healing 08/10/2017  . Closed fracture of multiple ribs of left side with routine healing 08/02/2017  . Fracture of manubrium  08/02/2017  . Rib fracture 08/02/2017  . Abnormal glucose 11/24/2016  . BMI 37.0-37.9, adult 07/17/2016  . Essential hypertension 07/17/2016  . Sensory neuropathy 07/17/2016    Past Surgical History:  Procedure Laterality Date  . APPENDECTOMY    . CHOLECYSTECTOMY    . COLONOSCOPY N/A 02/12/2015   Procedure: COLONOSCOPY;  Surgeon: Manya Silvas, MD;  Location: [facility] ENDOSCOPY;  Service: Endoscopy;  Laterality: N/A;  . GANGLION CYST EXCISION    . HERNIA REPAIR    . TYMPANOPLASTY Left 09/15/2016   Procedure: LEFT SIDE TYMPANOPLASTY;  Surgeon: Rozetta Nunnery, MD;  Location: [facility];  Service: ENT;  Laterality: Left;    Prior to Admission medications   Medication Sig Start Date End Date Taking? Authorizing Provider  alfuzosin (UROXATRAL) 10 MG 24 hr tablet Take 10 mg by mouth daily with breakfast.   Yes [provider]  aspirin 81 MG tablet Take 81 mg by mouth daily.   Yes [provider]  CINNAMON PO Take by mouth.   Yes [provider]  Multiple Vitamins-Minerals (MULTIVITAMIN WITH MINERALS) tablet Take 1 tablet by mouth daily.   Yes [provider]  Omega-3 Fatty Acids (FISH OIL) 1200 MG CAPS Take 1,200 mg by mouth daily.    Yes [provider]  valsartan-hydrochlorothiazide (DIOVAN-HCT) 160-25 MG tablet Take 1 tablet by mouth daily.   Yes [provider]  naproxen sodium (ANAPROX) 220 MG tablet Take 220 mg by mouth 2 (two) times daily with a  meal. Patient not taking: Reported on 02/06/2020    [provider]    Allergies Sulfamethoxazole, Crestor [rosuvastatin calcium], Rosuvastatin, and Rosuvastatin calcium  History reviewed. No pertinent family history.  Social History Social History   Tobacco Use  . Smoking status: Never Smoker  . Smokeless tobacco: Never Used  Vaping Use  . Vaping Use: Never used  Substance Use Topics  . Alcohol use: No  . Drug use: No    Review of  Systems Constitutional: No fever/chills Eyes: No visual changes. ENT: No sore throat. Cardiovascular: Denies chest pain. Respiratory: Denies shortness of breath. Gastrointestinal: Endorses suprapubic abdominal pain.  No nausea, no vomiting.  No diarrhea. Genitourinary: Negative for dysuria. Musculoskeletal: Negative for acute arthralgias Skin: Negative for rash. Neurological: Negative for headaches, weakness/numbness/paresthesias in any extremity Psychiatric: Negative for suicidal ideation/homicidal ideation   ____________________________________________   PHYSICAL EXAM:  VITAL SIGNS: ED Triage Vitals  Enc Vitals Group     BP 02/06/20 0809 (!) 151/87     Pulse Rate 02/06/20 0809 (!) 105     Resp 02/06/20 0809 (!) 24     Temp 02/06/20 0809 100 F (37.8 C)     Temp Source 02/06/20 0809 Oral     SpO2 02/06/20 0809 94 %     Weight 02/06/20 0811 221 lb (100.2 kg)     Height 02/06/20 0811 5\' 5"  (1.651 m)     Head Circumference --      Peak Flow --      Pain Score 02/06/20 0811 7     Pain Loc --      Pain Edu? --      Excl. in [place]? --    Constitutional: Alert and oriented. Well appearing and in no acute distress. Eyes: Conjunctivae are normal. PERRL. Head: Atraumatic. Nose: No congestion/rhinnorhea. Mouth/Throat: Mucous membranes are moist. Neck: No stridor Cardiovascular: Grossly normal heart sounds.  Good peripheral circulation. Respiratory: Normal respiratory effort.  No retractions. Gastrointestinal: Soft and nontender. No distention. Musculoskeletal: No obvious deformities Neurologic:  Normal speech and language. No gross focal neurologic deficits are appreciated. Skin:  Skin is warm and dry. No rash noted. Psychiatric: Mood and affect are normal. Speech and behavior are normal.  ____________________________________________   LABS (all labs ordered are listed, but only abnormal results are displayed)  Labs Reviewed  COMPREHENSIVE METABOLIC PANEL - Abnormal;  Notable for the following components:      Result Value   Sodium 133 (*)    Chloride 95 (*)    Glucose, Bld 204 (*)    Calcium 8.6 (*)    All other components within normal limits  LACTIC ACID, PLASMA - Abnormal; Notable for the following components:   Lactic Acid, Venous 2.0 (*)    All other components within normal limits  CBC WITH DIFFERENTIAL/PLATELET - Abnormal; Notable for the following components:   WBC 19.0 (*)    Neutro Abs 16.5 (*)    Monocytes Absolute 1.6 (*)    Abs Immature Granulocytes 0.10 (*)    All other components within normal limits  URINALYSIS, COMPLETE (UACMP) WITH MICROSCOPIC - Abnormal; Notable for the following components:   Color, Urine AMBER (*)    APPearance CLOUDY (*)    Hgb urine dipstick LARGE (*)    Ketones, ur 5 (*)    Protein, ur 100 (*)    Nitrite POSITIVE (*)    Leukocytes,Ua MODERATE (*)    RBC / HPF >50 (*)    WBC, UA >50 (*)  Bacteria, UA MANY (*)    All other components within normal limits  RESPIRATORY PANEL BY RT PCR (FLU A&B, COVID)  CULTURE, BLOOD (ROUTINE X 2)  CULTURE, BLOOD (ROUTINE X 2)  PROTIME-INR  LACTIC ACID, PLASMA  HIV ANTIBODY (ROUTINE TESTING W REFLEX)   ____________________________________________  RADIOLOGY  ED MD interpretation: 2 view x-ray of the chest shows no evidence of acute abnormalities including no pneumothorax, pneumonia, or widened mediastinum  CT of the abdomen with contrast shows stranding around the left kidney likely due to bacterial pyelonephritis  Official radiology report(s): DG Chest 2 View  Result Date: 02/06/2020 CLINICAL DATA:  Fever and nausea EXAM: CHEST - 2 VIEW COMPARISON:  December 22, 2019 FINDINGS: There is a focal area of airspace opacity in the anterior left base. Lungs elsewhere are clear. Heart size and pulmonary vascularity normal. No adenopathy. There is aortic atherosclerosis. No bone lesions. IMPRESSION: Focal airspace opacity concerning for pneumonia anterior left base.  Lungs elsewhere clear. Cardiac silhouette normal. No adenopathy. Aortic Atherosclerosis (ICD10-I70.0). Followup PA and lateral chest radiographs recommended in 3-4 weeks following trial of antibiotic therapy to ensure resolution and exclude underlying malignancy. Electronically Signed   By: Lowella Grip III M.D.   On: 02/06/2020 09:02   CT Abdomen Pelvis W Contrast  Result Date: 02/06/2020 CLINICAL DATA:  Hematuria with nausea and fever EXAM: CT ABDOMEN AND PELVIS WITH CONTRAST TECHNIQUE: Multidetector CT imaging of the abdomen and pelvis was performed using the standard protocol following bolus administration of intravenous contrast. CONTRAST:  161mL OMNIPAQUE IOHEXOL 300 MG/ML  SOLN COMPARISON:  Jul 27, 2017 FINDINGS: Lower chest: There is mild scarring in the lung bases. There is no lung base edema or consolidation. There are foci of coronary artery calcification. There is a small hiatal hernia. Hepatobiliary: There is hepatic steatosis. There are scattered subcentimeter cysts in the liver. Gallbladder is absent. There is no appreciable biliary duct dilatation given post cholecystectomy state. No biliary duct mass or calculus evident. Pancreas: No pancreatic mass or inflammatory focus. Spleen: No splenic lesions are evident. Adrenals/Urinary Tract: Adrenals bilaterally appear normal. There is no well-defined renal mass. Note that there are areas of decreased enhancement in portions of the mid and posterior aspects of the kidney involving portions of each upper and lower pole region on the left without associated air. There is associated perinephric stranding on the left. The right kidney shows no changes of this nature. There is no hydronephrosis on either side. There is no evident renal or ureteral calculus on either side. Urinary bladder is midline with wall thickness within normal limits. Stomach/Bowel: There is no appreciable bowel wall or mesenteric thickening. There are multiple descending colon  and sigmoid diverticula without evident diverticulitis. Terminal ileum appears normal. There is no demonstrable bowel obstruction. There is no free air or portal venous air. Vascular/Lymphatic: There is no abdominal aortic aneurysm. There is aortic and common iliac artery atherosclerotic calcification. Major venous structures appear patent. There is no appreciable adenopathy in the abdomen or pelvis. Reproductive: Occasional prostatic calculi noted. Prostate and seminal vesicles normal in size and contour. Other: The appendix appears normal. There is no evident abscess apart from inflammatory change involving the left kidney or ascites in the abdomen or pelvis. Fat noted in each inguinal ring. Musculoskeletal: There are foci of degenerative change in the lumbar spine. Focal spinal stenosis is noted at L4-5 due to bony hypertrophy and disc protrusion. No blastic or lytic bone lesions are evident. No intramuscular lesions are appreciable. IMPRESSION:  1. Areas of inhomogeneous enhancement in the left kidney as well as perinephric stranding. No well-defined fluid in the perinephric fascia on the left. This appearance is felt to be indicative of bacterial nephritis/acute lobar nephronia. Appropriate laboratory assessment in this regard advised. 2. No hydronephrosis on either side. No evident renal or ureteral calculus on either side. Urinary bladder wall thickness normal. 3. Multiple descending colonic and sigmoid diverticula without diverticulitis. No bowel wall thickening or bowel obstruction. No abscess in the abdomen or pelvis. Appendix appears normal. 4.  Hepatic steatosis. 5.  Gallbladder absent. 6. Aortic Atherosclerosis (ICD10-I70.0). Foci of coronary artery calcification noted. 7.  Small hiatal hernia. 8. Degree of spinal stenosis at L4-5 due to bony hypertrophy and disc protrusion. Electronically Signed   By: Lowella Grip III M.D.   On: 02/06/2020 10:11     ____________________________________________   PROCEDURES  Procedure(s) performed (including Critical Care):  .Critical Care Performed by: Naaman Plummer, MD Authorized by: Naaman Plummer, MD   Critical care provider statement:    Critical care time (minutes):  41   Critical care time was exclusive of:  Separately billable procedures and treating other patients   Critical care was necessary to treat or prevent imminent or life-threatening deterioration of the following conditions:  Sepsis   Critical care was time spent personally by me on the following activities:  Discussions with consultants, evaluation of patient's response to treatment, examination of patient, ordering and performing treatments and interventions, ordering and review of laboratory studies, ordering and review of radiographic studies, pulse oximetry, re-evaluation of patient's condition, obtaining history from patient or surrogate and review of old charts   I assumed direction of critical care for this patient from another provider in my specialty: no   .1-3 Lead EKG Interpretation Performed by: Naaman Plummer, MD Authorized by: Naaman Plummer, MD     Interpretation: normal     ECG rate:  88   ECG rate assessment: normal     Rhythm: sinus rhythm     Ectopy: none     Conduction: normal       ____________________________________________   INITIAL IMPRESSION / ASSESSMENT AND PLAN / ED COURSE  As part of my medical decision making, I reviewed the following data within the [place] notes reviewed and incorporated, Labs reviewed, EKG interpreted, Old chart reviewed, Radiograph reviewed and Notes from prior ED visits reviewed and incorporated     Unlikely TOA, Ovarian Torsion, PID, gonorrhea/chlamydia. Low suspicion for Infected Urolithiasis, AAA, Cholecystitis, Pancreatitis, SBO, Appendicitis, or other acute abdomen.  Workup: Renal imaging to r/o renal/perinephric abscesses  (BUS w/o overt e/o hydronephrosis) Interventions: Start cefepime Disposition: Admission for IV ABX and continued monitoring     ____________________________________________   FINAL CLINICAL IMPRESSION(S) / ED DIAGNOSES  Final diagnoses:  Pyelonephritis  Sepsis, due to unspecified organism, unspecified whether acute organ dysfunction present [facility])     ED Discharge Orders    None       Note:  This document was prepared using Dragon voice recognition software and may include unintentional dictation errors.   Naaman Plummer, MD 02/06/20 1425

## 2020-02-06 NOTE — ED Triage Notes (Signed)
Pt presents to ED via POV with c/o nausea, fever x 3 days. Pt states has been being treated for kidney infection by infection disease. Pt also c/o cough, states had a Covid test yesterday, pending results at this time. Pt also c/o urinary retention, states has been being treated for bacterial prostate infection.

## 2020-02-07 LAB — BLOOD CULTURE ID PANEL (REFLEXED) - BCID2

## 2020-02-07 LAB — CBC
HCT: 43 % (ref 39.0–52.0)
Hemoglobin: 14.3 g/dL (ref 13.0–17.0)
MCH: 29.6 pg (ref 26.0–34.0)
MCHC: 33.3 g/dL (ref 30.0–36.0)
MCV: 89 fL (ref 80.0–100.0)
Platelets: 172 10*3/uL (ref 150–400)
RBC: 4.83 MIL/uL (ref 4.22–5.81)
RDW: 12.9 % (ref 11.5–15.5)
WBC: 11.8 10*3/uL — ABNORMAL HIGH (ref 4.0–10.5)
nRBC: 0 % (ref 0.0–0.2)

## 2020-02-07 LAB — PROCALCITONIN: Procalcitonin: 0.42 ng/mL

## 2020-02-07 LAB — BASIC METABOLIC PANEL
Anion gap: 9 (ref 5–15)
BUN: 15 mg/dL (ref 8–23)
CO2: 28 mmol/L (ref 22–32)
Calcium: 8 mg/dL — ABNORMAL LOW (ref 8.9–10.3)
Chloride: 100 mmol/L (ref 98–111)
Creatinine, Ser: 0.85 mg/dL (ref 0.61–1.24)
GFR, Estimated: >60 mL/min (ref >60)
Glucose, Bld: 139 mg/dL — ABNORMAL HIGH (ref 70–99)
Potassium: 3.4 mmol/L — ABNORMAL LOW (ref 3.5–5.1)
Sodium: 137 mmol/L (ref 135–145)

## 2020-02-07 LAB — CULTURE, BLOOD (ROUTINE X 2): Special Requests: ADEQUATE

## 2020-02-07 LAB — LACTIC ACID, PLASMA: Lactic Acid, Venous: 1.3 mmol/L (ref 0.5–1.9)

## 2020-02-07 LAB — CORTISOL-AM, BLOOD: Cortisol - AM: 15.9 ug/dL (ref 6.7–22.6)

## 2020-02-07 LAB — PROTIME-INR
INR: 1.2 (ref 0.8–1.2)
Prothrombin Time: 14.6 seconds (ref 11.4–15.2)

## 2020-02-07 MED ORDER — ADULT MULTIVITAMIN W/MINERALS CH
1.0000 | ORAL_TABLET | Freq: Every day | ORAL | Status: DC
  Administered 2020-02-07 – 2020-02-10 (×4): 1 via ORAL
  Filled 2020-02-07 (×4): qty 1

## 2020-02-07 MED ORDER — POTASSIUM CHLORIDE IN NACL 20-0.9 MEQ/L-% IV SOLN
INTRAVENOUS | Status: DC
  Filled 2020-02-07 (×7): qty 1000

## 2020-02-07 MED ORDER — PHENOL 1.4 % MT LIQD
1.0000 | OROMUCOSAL | Status: DC | PRN
  Administered 2020-02-07: 1 via OROMUCOSAL
  Filled 2020-02-07: qty 177

## 2020-02-07 NOTE — Progress Notes (Addendum)
PROGRESS NOTE    Martin Hanna  MOQ:[id number] DOB: Mar 10, 1956 DOA: 02/06/2020 PCP: Cyndi Bender, PA-C  Outpatient Specialists: [facility] in [place]    Brief Narrative:    Martin Hanna is a 64 y.o. male with medical history significant for hypertension, obesity, dyslipidemia, prostate disease and sleep apnea presents to the emergency room for evaluation of a fever which he has had intermittently for the last 3 days.  Patient states that he has been treated with antibiotics as an outpatient for UTI and appears to have completed a course of ciprofloxacin and Augmentin without improvement in his symptoms.  He was recently seen by an infectious disease physician on 02/04/20 for ESBL positive E. coli in his urine but was not placed on any further antibiotic therapy.  Patient complains of a fever with a T-max of 103 F associated with feeling of incomplete voiding, poor urine stream and urinary dribbling.  He denies having any frequency, dysuria or nocturia. He was referred to urology but was not able to keep that appointment because he did not feel well.  He also complains of nausea and poor oral intake but denies having any vomiting, no abdominal pain, no changes in his bowel habits, no dizziness, no lightheadedness, no shortness of breath, no chest pain, no palpitations. Labs show sodium one thirty-three, potassium 3.8, chloride ninety-five, bicarb twenty-five, glucose two oh four, BUN eighteen, creatinine 1.02, calcium 8.6, alkaline phosphatase fifty-two, albumin 3.8, AST twenty-two, ALT twenty-three, total protein 7.6, lactic acid 2.0, white count 19.0, hemoglobin 15.9, hematocrit 46.5, MCV 86.8, RDW 13.0, platelet count 193, PT 13.5, INR 1.1 Respiratory viral panel is negative Urine analysis is positive for nitrites and leukocyte esterase with many bacteria. CT scan of abdomen and pelvis shows areas of inhomogeneous enhancement in the left kidney as well as perinephric stranding.  No well-defined fluid in the perinephric fascia on the left. This appearance is felt to be indicative of bacterial nephritis/acute lobar nephronia. Appropriate laboratory assessment in this regard advised. No hydronephrosis on either side. No evident renal or ureteral calculus on either side. Urinary bladder wall thickness normal.  ED Course: Patient is a 64 year old Caucasian male who presents to the ER for evaluation of a fever and is noted to be septic from a urinary source.  Patient has failed outpatient antibiotic therapy with ciprofloxacin and Augmentin and had a urine culture which yielded ESBL positive E. coli.  He received sepsis IV fluid requirement in the ER and a dose of cefepime and will be admitted to the hospital for further evaluation.   Assessment & Plan:   Principal Problem:   Sepsis ([place]) Active Problems:   Essential hypertension   UTI due to extended-spectrum beta lactamase (ESBL) producing Escherichia coli   Hypercholesteremia   Bacterial nephritis  # Sepsis from a urinary source (POA) # Bacteremia As evidenced by fever with a T-max of 100F, tachycardia and tachypnea with source of sepsis from the urinary system associated with elevated lactic acid and marked leukocytosis of 19,000 with a left shift. Patient has a history of ESBL positive E. coli in his urine and was treated as an outpatient with oral antibiotics (cipro, augmentin) and appears to have failed outpatient treatment. Blood culture growing ESBL e. Coli. CT shows left sided pyelo, no obstruction or abscess. Hx recurrent UTIs - continue meropenem - f/u cultures/sensitivities - repeat lactate - ns @ 125 w/ 20 kcl - repeat CT if fails to improve  # Hypertension BP wnl - hold home  irbesartan/hctz given septic picture  # Dyslipidemia - Continue Lovaza  # History of BPH Possible urethral stricture after traumatic foley. Followed by [facility] in [place] - cont alfuzosin - close outpt f/u,  this is likely the etiology of recurrent UTIs.  # OSA Not on cpap at home - outpt f/u   DVT prophylaxis: lovenox Code Status: full Family Communication: none at bedside  Status is: Inpatient  Remains inpatient appropriate because:Inpatient level of care appropriate due to severity of illness   Dispo: The patient is from: Home              Anticipated d/c is to: Home              Anticipated d/c date is: 3 days              Patient currently is not medically stable to d/c.        Consultants:  urology  Procedures: none  Antimicrobials:  Cefepime 11/12 Meropenem 11/12>    Subjective: This morning lack of appetite, no n/v. Body aches. No flank pain. No diarrhea or chest pain or cough or dyspnea.  Objective: Vitals:   02/06/20 2021 02/06/20 2135 02/07/20 0523 02/07/20 0850  BP: (!) 151/90  132/75 139/83  Pulse: 91  73 73  Resp: 19  20 19   Temp: (!) 100.5 F (38.1 C) 99 F (37.2 C) 98.5 F (36.9 C) 98.9 F (37.2 C)  TempSrc: Oral Oral Oral Oral  SpO2: 93%  93% 98%  Weight:   99.8 kg   Height:        Intake/Output Summary (Last 24 hours) at 02/07/2020 0903 Last data filed at 02/07/2020 0300 Gross per 24 hour  Intake 1374.69 ml  Output 1600 ml  Net -225.31 ml   Filed Weights   02/06/20 0811 02/07/20 0523  Weight: 100.2 kg 99.8 kg    Examination:  General exam: Appears calm and comfortable  Respiratory system: Clear to auscultation. Respiratory effort normal. Cardiovascular system: S1 & S2 heard, RRR. No JVD, murmurs, rubs, gallops or clicks. No pedal edema. Gastrointestinal system: Abdomen is nondistended, obese, soft and nontender. No organomegaly or masses felt. Normal bowel sounds heard. Central nervous system: Alert and oriented. No focal neurological deficits. Extremities: Symmetric 5 x 5 power. Skin: No rashes, lesions or ulcers Psychiatry: Judgement and insight appear normal. Mood & affect appropriate.     Data Reviewed: I have  personally reviewed following labs and imaging studies  CBC: Recent Labs  Lab 02/06/20 0821 02/07/20 0713  WBC 19.0* 11.8*  NEUTROABS 16.5*  --   HGB 15.9 14.3  HCT 46.5 43.0  MCV 86.8 89.0  PLT 193 144   Basic Metabolic Panel: Recent Labs  Lab 02/06/20 0821 02/07/20 0713  NA 133* 137  K 3.8 3.4*  CL 95* 100  CO2 25 28  GLUCOSE 204* 139*  BUN 18 15  CREATININE 1.02 0.85  CALCIUM 8.6* 8.0*   GFR: Estimated Creatinine Clearance: 92.9 mL/min (by C-G formula based on SCr of 0.85 mg/dL). Liver Function Tests: Recent Labs  Lab 02/06/20 0821  AST 22  ALT 23  ALKPHOS 52  BILITOT 0.8  PROT 7.6  ALBUMIN 3.8   No results for input(s): LIPASE, AMYLASE in the last 168 hours. No results for input(s): AMMONIA in the last 168 hours. Coagulation Profile: Recent Labs  Lab 02/06/20 0821 02/07/20 0713  INR 1.1 1.2   Cardiac Enzymes: No results for input(s): CKTOTAL, CKMB, CKMBINDEX, TROPONINI in the  last 168 hours. BNP (last 3 results) No results for input(s): PROBNP in the last 8760 hours. HbA1C: No results for input(s): HGBA1C in the last 72 hours. CBG: No results for input(s): GLUCAP in the last 168 hours. Lipid Profile: No results for input(s): CHOL, HDL, LDLCALC, TRIG, CHOLHDL, LDLDIRECT in the last 72 hours. Thyroid Function Tests: No results for input(s): TSH, T4TOTAL, FREET4, T3FREE, THYROIDAB in the last 72 hours. Anemia Panel: No results for input(s): VITAMINB12, FOLATE, FERRITIN, TIBC, IRON, RETICCTPCT in the last 72 hours. Urine analysis:    Component Value Date/Time   COLORURINE AMBER (A) 02/06/2020 0824   APPEARANCEUR CLOUDY (A) 02/06/2020 0824   LABSPEC 1.026 02/06/2020 0824   PHURINE 5.0 02/06/2020 0824   GLUCOSEU NEGATIVE 02/06/2020 0824   HGBUR LARGE (A) 02/06/2020 0824   BILIRUBINUR NEGATIVE 02/06/2020 0824   KETONESUR 5 (A) 02/06/2020 0824   PROTEINUR 100 (A) 02/06/2020 0824   NITRITE POSITIVE (A) 02/06/2020 0824   LEUKOCYTESUR MODERATE (A)  02/06/2020 0824   Sepsis Labs: @LABRCNTIP (procalcitonin:4,lacticidven:4)  ) Recent Results (from the past 240 hour(s))  Culture, blood (Routine x 2)     Status: None (Preliminary result)   Collection Time: 02/06/20  8:21 AM   Specimen: BLOOD  Result Value Ref Range Status   Specimen Description BLOOD RIGHT ANTECUBITAL  Final   Special Requests   Final    BOTTLES DRAWN AEROBIC AND ANAEROBIC Blood Culture adequate volume   Culture  Setup Time   Final    Organism ID to follow GRAM NEGATIVE RODS AEROBIC BOTTLE ONLY CRITICAL RESULT CALLED TO, READ BACK BY AND VERIFIED WITH: SCOTT HALL AT 0102 ON 02/07/20 SNG Performed at [facility], [address] [place], [place] [address]    Culture GRAM NEGATIVE RODS  Final   Report Status PENDING  Incomplete  Blood Culture ID Panel (Reflexed)     Status: Abnormal   Collection Time: 02/06/20  8:21 AM  Result Value Ref Range Status   Enterococcus faecalis NOT DETECTED NOT DETECTED Final   Enterococcus Faecium NOT DETECTED NOT DETECTED Final   Listeria monocytogenes NOT DETECTED NOT DETECTED Final   Staphylococcus species NOT DETECTED NOT DETECTED Final   Staphylococcus aureus (BCID) NOT DETECTED NOT DETECTED Final   Staphylococcus epidermidis NOT DETECTED NOT DETECTED Final   Staphylococcus lugdunensis NOT DETECTED NOT DETECTED Final   Streptococcus species NOT DETECTED NOT DETECTED Final   Streptococcus agalactiae NOT DETECTED NOT DETECTED Final   Streptococcus pneumoniae NOT DETECTED NOT DETECTED Final   Streptococcus pyogenes NOT DETECTED NOT DETECTED Final   A.calcoaceticus-baumannii NOT DETECTED NOT DETECTED Final   Bacteroides fragilis NOT DETECTED NOT DETECTED Final   Enterobacterales DETECTED (A) NOT DETECTED Final    Comment: Enterobacterales represent a large order of gram negative bacteria, not a single organism. CRITICAL RESULT CALLED TO, READ BACK BY AND VERIFIED WITH: SCOTT HALL AT 0102 ON 02/07/20 SNG     Enterobacter cloacae complex NOT DETECTED NOT DETECTED Final   Escherichia coli DETECTED (A) NOT DETECTED Final    Comment: CRITICAL RESULT CALLED TO, READ BACK BY AND VERIFIED WITH: SCOTT HALL AT 0104 ON 02/07/20 SNG    Klebsiella aerogenes NOT DETECTED NOT DETECTED Final   Klebsiella oxytoca NOT DETECTED NOT DETECTED Final   Klebsiella pneumoniae NOT DETECTED NOT DETECTED Final   Proteus species NOT DETECTED NOT DETECTED Final   Salmonella species NOT DETECTED NOT DETECTED Final   Serratia marcescens NOT DETECTED NOT DETECTED Final   Haemophilus influenzae NOT  DETECTED NOT DETECTED Final   Neisseria meningitidis NOT DETECTED NOT DETECTED Final   Pseudomonas aeruginosa NOT DETECTED NOT DETECTED Final   Stenotrophomonas maltophilia NOT DETECTED NOT DETECTED Final   Candida albicans NOT DETECTED NOT DETECTED Final   Candida auris NOT DETECTED NOT DETECTED Final   Candida glabrata NOT DETECTED NOT DETECTED Final   Candida krusei NOT DETECTED NOT DETECTED Final   Candida parapsilosis NOT DETECTED NOT DETECTED Final   Candida tropicalis NOT DETECTED NOT DETECTED Final   Cryptococcus neoformans/gattii NOT DETECTED NOT DETECTED Final   CTX-M ESBL DETECTED (A) NOT DETECTED Final    Comment: CRITICAL RESULT CALLED TO, READ BACK BY AND VERIFIED WITH: SCOTT HALL AT 0104 ON 02/07/20 SNG (NOTE) Extended spectrum beta-lactamase detected. Recommend a carbapenem as initial therapy.      Carbapenem resistance IMP NOT DETECTED NOT DETECTED Final   Carbapenem resistance KPC NOT DETECTED NOT DETECTED Final   Carbapenem resistance NDM NOT DETECTED NOT DETECTED Final   Carbapenem resist OXA 48 LIKE NOT DETECTED NOT DETECTED Final   Carbapenem resistance VIM NOT DETECTED NOT DETECTED Final    Comment: Performed at [facility], [address] [place], [place] [address]  Culture, blood (Routine x 2)     Status: None (Preliminary result)   Collection Time: 02/06/20  9:29 AM   Specimen:  BLOOD  Result Value Ref Range Status   Specimen Description BLOOD BLOOD LEFT HAND  Final   Special Requests   Final    BOTTLES DRAWN AEROBIC AND ANAEROBIC Blood Culture adequate volume   Culture   Final    NO GROWTH < 24 HOURS Performed at [facility], [address], [place], [place] [address]    Report Status PENDING  Incomplete  Respiratory Panel by RT PCR (Flu A&B, Covid) - Nasopharyngeal Swab     Status: None   Collection Time: 02/06/20  9:29 AM   Specimen: Nasopharyngeal Swab  Result Value Ref Range Status   SARS Coronavirus 2 by RT PCR NEGATIVE NEGATIVE Final    Comment: (NOTE) SARS-CoV-2 target nucleic acids are NOT DETECTED.  The SARS-CoV-2 RNA is generally detectable in upper respiratoy specimens during the acute phase of infection. The lowest concentration of SARS-CoV-2 viral copies this assay can detect is 131 copies/mL. A negative result does not preclude SARS-Cov-2 infection and should not be used as the sole basis for treatment or other patient management decisions. A negative result may occur with  improper specimen collection/handling, submission of specimen other than nasopharyngeal swab, presence of viral mutation(s) within the areas targeted by this assay, and inadequate number of viral copies (<131 copies/mL). A negative result must be combined with clinical observations, patient history, and epidemiological information. The expected result is Negative.  Fact Sheet for Patients:  PinkCheek.be  Fact Sheet for Healthcare Providers:  GravelBags.it  This test is no t yet approved or cleared by the [place] FDA and  has been authorized for detection and/or diagnosis of SARS-CoV-2 by FDA under an Emergency Use Authorization (EUA). This EUA will remain  in effect (meaning this test can be used) for the duration of the COVID-19 declaration under Section 564(b)(1) of the Act, 21  U.S.C. section 360bbb-3(b)(1), unless the authorization is terminated or revoked sooner.     Influenza A by PCR NEGATIVE NEGATIVE Final   Influenza B by PCR NEGATIVE NEGATIVE Final    Comment: (NOTE) The Xpert Xpress SARS-CoV-2/FLU/RSV assay is intended as an aid in  the diagnosis of  influenza from Nasopharyngeal swab specimens and  should not be used as a sole basis for treatment. Nasal washings and  aspirates are unacceptable for Xpert Xpress SARS-CoV-2/FLU/RSV  testing.  Fact Sheet for Patients: PinkCheek.be  Fact Sheet for Healthcare Providers: GravelBags.it  This test is not yet approved or cleared by the [place] FDA and  has been authorized for detection and/or diagnosis of SARS-CoV-2 by  FDA under an Emergency Use Authorization (EUA). This EUA will remain  in effect (meaning this test can be used) for the duration of the  Covid-19 declaration under Section 564(b)(1) of the Act, 21  U.S.C. section 360bbb-3(b)(1), unless the authorization is  terminated or revoked. Performed at [facility], [address] [place], [place] [address]          Radiology Studies: DG Chest 2 View  Result Date: 02/06/2020 CLINICAL DATA:  Fever and nausea EXAM: CHEST - 2 VIEW COMPARISON:  December 22, 2019 FINDINGS: There is a focal area of airspace opacity in the anterior left base. Lungs elsewhere are clear. Heart size and pulmonary vascularity normal. No adenopathy. There is aortic atherosclerosis. No bone lesions. IMPRESSION: Focal airspace opacity concerning for pneumonia anterior left base. Lungs elsewhere clear. Cardiac silhouette normal. No adenopathy. Aortic Atherosclerosis (ICD10-I70.0). Followup PA and lateral chest radiographs recommended in 3-4 weeks following trial of antibiotic therapy to ensure resolution and exclude underlying malignancy. Electronically Signed   By: Lowella Grip III M.D.   On:  02/06/2020 09:02   CT Abdomen Pelvis W Contrast  Result Date: 02/06/2020 CLINICAL DATA:  Hematuria with nausea and fever EXAM: CT ABDOMEN AND PELVIS WITH CONTRAST TECHNIQUE: Multidetector CT imaging of the abdomen and pelvis was performed using the standard protocol following bolus administration of intravenous contrast. CONTRAST:  158mL OMNIPAQUE IOHEXOL 300 MG/ML  SOLN COMPARISON:  Jul 27, 2017 FINDINGS: Lower chest: There is mild scarring in the lung bases. There is no lung base edema or consolidation. There are foci of coronary artery calcification. There is a small hiatal hernia. Hepatobiliary: There is hepatic steatosis. There are scattered subcentimeter cysts in the liver. Gallbladder is absent. There is no appreciable biliary duct dilatation given post cholecystectomy state. No biliary duct mass or calculus evident. Pancreas: No pancreatic mass or inflammatory focus. Spleen: No splenic lesions are evident. Adrenals/Urinary Tract: Adrenals bilaterally appear normal. There is no well-defined renal mass. Note that there are areas of decreased enhancement in portions of the mid and posterior aspects of the kidney involving portions of each upper and lower pole region on the left without associated air. There is associated perinephric stranding on the left. The right kidney shows no changes of this nature. There is no hydronephrosis on either side. There is no evident renal or ureteral calculus on either side. Urinary bladder is midline with wall thickness within normal limits. Stomach/Bowel: There is no appreciable bowel wall or mesenteric thickening. There are multiple descending colon and sigmoid diverticula without evident diverticulitis. Terminal ileum appears normal. There is no demonstrable bowel obstruction. There is no free air or portal venous air. Vascular/Lymphatic: There is no abdominal aortic aneurysm. There is aortic and common iliac artery atherosclerotic calcification. Major venous structures  appear patent. There is no appreciable adenopathy in the abdomen or pelvis. Reproductive: Occasional prostatic calculi noted. Prostate and seminal vesicles normal in size and contour. Other: The appendix appears normal. There is no evident abscess apart from inflammatory change involving the left kidney or ascites in the abdomen or pelvis. Fat noted  in each inguinal ring. Musculoskeletal: There are foci of degenerative change in the lumbar spine. Focal spinal stenosis is noted at L4-5 due to bony hypertrophy and disc protrusion. No blastic or lytic bone lesions are evident. No intramuscular lesions are appreciable. IMPRESSION: 1. Areas of inhomogeneous enhancement in the left kidney as well as perinephric stranding. No well-defined fluid in the perinephric fascia on the left. This appearance is felt to be indicative of bacterial nephritis/acute lobar nephronia. Appropriate laboratory assessment in this regard advised. 2. No hydronephrosis on either side. No evident renal or ureteral calculus on either side. Urinary bladder wall thickness normal. 3. Multiple descending colonic and sigmoid diverticula without diverticulitis. No bowel wall thickening or bowel obstruction. No abscess in the abdomen or pelvis. Appendix appears normal. 4.  Hepatic steatosis. 5.  Gallbladder absent. 6. Aortic Atherosclerosis (ICD10-I70.0). Foci of coronary artery calcification noted. 7.  Small hiatal hernia. 8. Degree of spinal stenosis at L4-5 due to bony hypertrophy and disc protrusion. Electronically Signed   By: Lowella Grip III M.D.   On: 02/06/2020 10:11        Scheduled Meds: . alfuzosin  10 mg Oral Q breakfast  . aspirin  81 mg Oral Daily  . enoxaparin (LOVENOX) injection  50 mg Subcutaneous Q24H  . irbesartan  150 mg Oral Daily   And  . hydrochlorothiazide  25 mg Oral Daily  . multivitamin  15 mL Oral Daily  . omega-3 acid ethyl esters  1 g Oral Daily   Continuous Infusions: . sodium chloride 125 mL/hr at  02/06/20 2328  . meropenem (MERREM) IV 1 g (02/07/20 0521)     LOS: 1 day    Time spent: 30 min    Martin Maxim, MD [facility]   If 7PM-7AM, please contact night-coverage [email or web address] Password [email or web address] 02/07/2020, 9:03 AM

## 2020-02-07 NOTE — Progress Notes (Signed)
PHARMACY - PHYSICIAN COMMUNICATION CRITICAL VALUE ALERT - BLOOD CULTURE IDENTIFICATION (BCID)  Martin Hanna is an 64 y.o. male who presented to [facility] on 02/06/2020 with a chief complaint of dysuria  Assessment:  Lab reports 1 of 4 bottles + for E Coli, ESBL  Name of physician (or Provider) Contacted: Jonny Ruiz NP  Current antibiotics: Meropenem  Changes to prescribed antibiotics recommended: no changes recommended (see ID note, ID following) Patient is on recommended antibiotics - No changes needed  Results for orders placed or performed during the hospital encounter of 02/06/20  Blood Culture ID Panel (Reflexed) (Collected: 02/06/2020  8:21 AM)  Result Value Ref Range   Enterococcus faecalis NOT DETECTED NOT DETECTED   Enterococcus Faecium NOT DETECTED NOT DETECTED   Listeria monocytogenes NOT DETECTED NOT DETECTED   Staphylococcus species NOT DETECTED NOT DETECTED   Staphylococcus aureus (BCID) NOT DETECTED NOT DETECTED   Staphylococcus epidermidis NOT DETECTED NOT DETECTED   Staphylococcus lugdunensis NOT DETECTED NOT DETECTED   Streptococcus species NOT DETECTED NOT DETECTED   Streptococcus agalactiae NOT DETECTED NOT DETECTED   Streptococcus pneumoniae NOT DETECTED NOT DETECTED   Streptococcus pyogenes NOT DETECTED NOT DETECTED   A.calcoaceticus-baumannii NOT DETECTED NOT DETECTED   Bacteroides fragilis NOT DETECTED NOT DETECTED   Enterobacterales DETECTED (A) NOT DETECTED   Enterobacter cloacae complex NOT DETECTED NOT DETECTED   Escherichia coli DETECTED (A) NOT DETECTED   Klebsiella aerogenes NOT DETECTED NOT DETECTED   Klebsiella oxytoca NOT DETECTED NOT DETECTED   Klebsiella pneumoniae NOT DETECTED NOT DETECTED   Proteus species NOT DETECTED NOT DETECTED   Salmonella species NOT DETECTED NOT DETECTED   Serratia marcescens NOT DETECTED NOT DETECTED   Haemophilus influenzae NOT DETECTED NOT DETECTED   Neisseria meningitidis NOT DETECTED NOT DETECTED    Pseudomonas aeruginosa NOT DETECTED NOT DETECTED   Stenotrophomonas maltophilia NOT DETECTED NOT DETECTED   Candida albicans NOT DETECTED NOT DETECTED   Candida auris NOT DETECTED NOT DETECTED   Candida glabrata NOT DETECTED NOT DETECTED   Candida krusei NOT DETECTED NOT DETECTED   Candida parapsilosis NOT DETECTED NOT DETECTED   Candida tropicalis NOT DETECTED NOT DETECTED   Cryptococcus neoformans/gattii NOT DETECTED NOT DETECTED   CTX-M ESBL DETECTED (A) NOT DETECTED   Carbapenem resistance IMP NOT DETECTED NOT DETECTED   Carbapenem resistance KPC NOT DETECTED NOT DETECTED   Carbapenem resistance NDM NOT DETECTED NOT DETECTED   Carbapenem resist OXA 48 LIKE NOT DETECTED NOT DETECTED   Carbapenem resistance VIM NOT DETECTED NOT DETECTED    Hart Robinsons A 02/07/2020  1:11 AM

## 2020-02-07 NOTE — Plan of Care (Signed)

## 2020-02-08 LAB — CBC
HCT: 38.2 % — ABNORMAL LOW (ref 39.0–52.0)
Hemoglobin: 13 g/dL (ref 13.0–17.0)
MCH: 29.5 pg (ref 26.0–34.0)
MCHC: 34 g/dL (ref 30.0–36.0)
MCV: 86.8 fL (ref 80.0–100.0)
Platelets: 167 10*3/uL (ref 150–400)
RBC: 4.4 MIL/uL (ref 4.22–5.81)
RDW: 13.1 % (ref 11.5–15.5)
WBC: 8.4 10*3/uL (ref 4.0–10.5)
nRBC: 0 % (ref 0.0–0.2)

## 2020-02-08 LAB — BASIC METABOLIC PANEL
Anion gap: 8 (ref 5–15)
BUN: 16 mg/dL (ref 8–23)
CO2: 26 mmol/L (ref 22–32)
Calcium: 7.6 mg/dL — ABNORMAL LOW (ref 8.9–10.3)
Chloride: 100 mmol/L (ref 98–111)
Creatinine, Ser: 0.72 mg/dL (ref 0.61–1.24)
GFR, Estimated: >60 mL/min (ref >60)
Glucose, Bld: 116 mg/dL — ABNORMAL HIGH (ref 70–99)
Potassium: 3.3 mmol/L — ABNORMAL LOW (ref 3.5–5.1)
Sodium: 134 mmol/L — ABNORMAL LOW (ref 135–145)

## 2020-02-08 LAB — MAGNESIUM: Magnesium: 2.1 mg/dL (ref 1.7–2.4)

## 2020-02-08 MED ORDER — IRBESARTAN 150 MG PO TABS
150.0000 mg | ORAL_TABLET | Freq: Every day | ORAL | Status: DC
  Administered 2020-02-08 – 2020-02-10 (×3): 150 mg via ORAL
  Filled 2020-02-08 (×4): qty 1

## 2020-02-08 MED ORDER — POTASSIUM CHLORIDE 20 MEQ PO PACK
60.0000 meq | PACK | Freq: Once | ORAL | Status: AC
  Administered 2020-02-08: 60 meq via ORAL
  Filled 2020-02-08: qty 3

## 2020-02-08 NOTE — Progress Notes (Addendum)
PROGRESS NOTE    Martin Hanna  ZOX:[id number] DOB: 15-Jan-1956 DOA: 02/06/2020 PCP: Cyndi Bender, PA-C  Outpatient Specialists: [facility] in [place]    Brief Narrative:    Martin Hanna is a 64 y.o. male with medical history significant for hypertension, obesity, dyslipidemia, prostate disease and sleep apnea presents to the emergency room for evaluation of a fever which he has had intermittently for the last 3 days.  Patient states that he has been treated with antibiotics as an outpatient for UTI and appears to have completed a course of ciprofloxacin and Augmentin without improvement in his symptoms.  He was recently seen by an infectious disease physician on 02/04/20 for ESBL positive E. coli in his urine but was not placed on any further antibiotic therapy.  Patient complains of a fever with a T-max of 103 F associated with feeling of incomplete voiding, poor urine stream and urinary dribbling.  He denies having any frequency, dysuria or nocturia. He was referred to urology but was not able to keep that appointment because he did not feel well.  He also complains of nausea and poor oral intake but denies having any vomiting, no abdominal pain, no changes in his bowel habits, no dizziness, no lightheadedness, no shortness of breath, no chest pain, no palpitations. Labs show sodium one thirty-three, potassium 3.8, chloride ninety-five, bicarb twenty-five, glucose two oh four, BUN eighteen, creatinine 1.02, calcium 8.6, alkaline phosphatase fifty-two, albumin 3.8, AST twenty-two, ALT twenty-three, total protein 7.6, lactic acid 2.0, white count 19.0, hemoglobin 15.9, hematocrit 46.5, MCV 86.8, RDW 13.0, platelet count 193, PT 13.5, INR 1.1 Respiratory viral panel is negative Urine analysis is positive for nitrites and leukocyte esterase with many bacteria. CT scan of abdomen and pelvis shows areas of inhomogeneous enhancement in the left kidney as well as perinephric stranding.  No well-defined fluid in the perinephric fascia on the left. This appearance is felt to be indicative of bacterial nephritis/acute lobar nephronia. Appropriate laboratory assessment in this regard advised. No hydronephrosis on either side. No evident renal or ureteral calculus on either side. Urinary bladder wall thickness normal.  ED Course: Patient is a 64 year old Caucasian male who presents to the ER for evaluation of a fever and is noted to be septic from a urinary source.  Patient has failed outpatient antibiotic therapy with ciprofloxacin and Augmentin and had a urine culture which yielded ESBL positive E. coli.  He received sepsis IV fluid requirement in the ER and a dose of cefepime and will be admitted to the hospital for further evaluation.   Assessment & Plan:   Principal Problem:   Sepsis ([place]) Active Problems:   Essential hypertension   UTI due to extended-spectrum beta lactamase (ESBL) producing Escherichia coli   Hypercholesteremia   Bacterial nephritis  # Sepsis from a urinary source (POA) # Bacteremia As evidenced by fever with a T-max of 100F, tachycardia and tachypnea with source of sepsis from the urinary system associated with elevated lactic acid and marked leukocytosis of 19,000 with a left shift. Patient has a history of ESBL positive E. coli in his urine and was treated as an outpatient with oral antibiotics (cipro, augmentin) and appears to have failed outpatient treatment. Blood culture and urine culture growing ESBL e. Coli. CT shows left sided pyelo, no obstruction or abscess. Hx recurrent UTIs. Improving - continue meropenem - f/u sensitivities - repeat blood culture today in anticipation of placing picc or midline for 1-2 wks IV abx - d/c fluids, is  taking good PO and sepsis is resolved - repeat CT if fails to improve  # Hypertension BP elevated - will re-start home valsartan (will substitute formulary irbesartan) - holding home hctzfor now  #  hypokalemia - mild, 3.3 - k 60 - f/u mg  # Dyslipidemia - Continue Lovaza  # History of BPH Possible urethral stricture after traumatic foley. Followed by [facility] in [place]. UOP is normal, denies symptoms of retention. - cont alfuzosin - close outpt f/u, this is likely the etiology of recurrent UTIs.  # OSA Not on cpap at home - outpt f/u   DVT prophylaxis: lovenox Code Status: full Family Communication: none at bedside  Status is: Inpatient  Remains inpatient appropriate because:Inpatient level of care appropriate due to severity of illness   Dispo: The patient is from: Home              Anticipated d/c is to: Home              Anticipated d/c date is: 1-2 days              Patient currently is not medically stable to d/c.        Consultants:  urology  Procedures: none  Antimicrobials:  Cefepime 11/12 Meropenem 11/12>    Subjective: Body aches resolved, no fevers, appetite improving but had some mild nausea this morning. No chills or flank pain.  Objective: Vitals:   02/08/20 0451 02/08/20 0622 02/08/20 0813 02/08/20 1208  BP: 139/81  (!) 148/87 (!) 151/76  Pulse: 66  68 77  Resp:   18 17  Temp: 98.2 F (36.8 C)  98.1 F (36.7 C) 98.5 F (36.9 C)  TempSrc: Oral  Oral Oral  SpO2: 94%  94% 97%  Weight:  100.8 kg    Height:        Intake/Output Summary (Last 24 hours) at 02/08/2020 1349 Last data filed at 02/08/2020 0754 Gross per 24 hour  Intake --  Output 1175 ml  Net -1175 ml   Filed Weights   02/06/20 0811 02/07/20 0523 02/08/20 0622  Weight: 100.2 kg 99.8 kg 100.8 kg    Examination:  General exam: Appears calm and comfortable  Respiratory system: Clear to auscultation. Respiratory effort normal. Cardiovascular system: S1 & S2 heard, RRR. No JVD, murmurs, rubs, gallops or clicks. No pedal edema. Gastrointestinal system: Abdomen is nondistended, obese, soft and nontender. No organomegaly or masses felt. Normal bowel  sounds heard. Central nervous system: Alert and oriented. No focal neurological deficits. Extremities: Symmetric 5 x 5 power. Skin: No rashes, lesions or ulcers Psychiatry: Judgement and insight appear normal. Mood & affect appropriate.     Data Reviewed: I have personally reviewed following labs and imaging studies  CBC: Recent Labs  Lab 02/06/20 0821 02/07/20 0713 02/08/20 0503  WBC 19.0* 11.8* 8.4  NEUTROABS 16.5*  --   --   HGB 15.9 14.3 13.0  HCT 46.5 43.0 38.2*  MCV 86.8 89.0 86.8  PLT 193 172 035   Basic Metabolic Panel: Recent Labs  Lab 02/06/20 0821 02/07/20 0713 02/08/20 0503  NA 133* 137 134*  K 3.8 3.4* 3.3*  CL 95* 100 100  CO2 25 28 26   GLUCOSE 204* 139* 116*  BUN 18 15 16   CREATININE 1.02 0.85 0.72  CALCIUM 8.6* 8.0* 7.6*   GFR: Estimated Creatinine Clearance: 99.2 mL/min (by C-G formula based on SCr of 0.72 mg/dL). Liver Function Tests: Recent Labs  Lab 02/06/20 0821  AST 22  ALT 23  ALKPHOS 52  BILITOT 0.8  PROT 7.6  ALBUMIN 3.8   No results for input(s): LIPASE, AMYLASE in the last 168 hours. No results for input(s): AMMONIA in the last 168 hours. Coagulation Profile: Recent Labs  Lab 02/06/20 0821 02/07/20 0713  INR 1.1 1.2   Cardiac Enzymes: No results for input(s): CKTOTAL, CKMB, CKMBINDEX, TROPONINI in the last 168 hours. BNP (last 3 results) No results for input(s): PROBNP in the last 8760 hours. HbA1C: No results for input(s): HGBA1C in the last 72 hours. CBG: No results for input(s): GLUCAP in the last 168 hours. Lipid Profile: No results for input(s): CHOL, HDL, LDLCALC, TRIG, CHOLHDL, LDLDIRECT in the last 72 hours. Thyroid Function Tests: No results for input(s): TSH, T4TOTAL, FREET4, T3FREE, THYROIDAB in the last 72 hours. Anemia Panel: No results for input(s): VITAMINB12, FOLATE, FERRITIN, TIBC, IRON, RETICCTPCT in the last 72 hours. Urine analysis:    Component Value Date/Time   COLORURINE AMBER (A) 02/06/2020  0824   APPEARANCEUR CLOUDY (A) 02/06/2020 0824   LABSPEC 1.026 02/06/2020 0824   PHURINE 5.0 02/06/2020 0824   GLUCOSEU NEGATIVE 02/06/2020 0824   HGBUR LARGE (A) 02/06/2020 0824   BILIRUBINUR NEGATIVE 02/06/2020 0824   KETONESUR 5 (A) 02/06/2020 0824   PROTEINUR 100 (A) 02/06/2020 0824   NITRITE POSITIVE (A) 02/06/2020 0824   LEUKOCYTESUR MODERATE (A) 02/06/2020 0824   Sepsis Labs: @LABRCNTIP (procalcitonin:4,lacticidven:4)  ) Recent Results (from the past 240 hour(s))  Culture, blood (Routine x 2)     Status: Abnormal (Preliminary result)   Collection Time: 02/06/20  8:21 AM   Specimen: BLOOD  Result Value Ref Range Status   Specimen Description   Final    BLOOD RIGHT ANTECUBITAL Performed at [facility], [address] [place], [place] [address]    Special Requests   Final    BOTTLES DRAWN AEROBIC AND ANAEROBIC Blood Culture adequate volume Performed at [facility], [address] [place], [place] [address]    Culture  Setup Time   Final    Organism ID to follow [place] TO, READ BACK BY AND VERIFIED WITH: Waldo ON 02/07/20 SNG Performed at [facility], [address], [place], [place] [address]    Culture (A)  Final    ESCHERICHIA COLI SUSCEPTIBILITIES TO FOLLOW Performed at [facility], [place] [address], [place], [place] [address]    Report Status PENDING  Incomplete  Blood Culture ID Panel (Reflexed)     Status: Abnormal   Collection Time: 02/06/20  8:21 AM  Result Value Ref Range Status   Enterococcus faecalis NOT DETECTED NOT DETECTED Final   Enterococcus Faecium NOT DETECTED NOT DETECTED Final   Listeria monocytogenes NOT DETECTED NOT DETECTED Final   Staphylococcus species NOT DETECTED NOT DETECTED Final   Staphylococcus aureus (BCID) NOT DETECTED NOT DETECTED Final   Staphylococcus epidermidis NOT DETECTED NOT DETECTED Final   Staphylococcus  lugdunensis NOT DETECTED NOT DETECTED Final   Streptococcus species NOT DETECTED NOT DETECTED Final   Streptococcus agalactiae NOT DETECTED NOT DETECTED Final   Streptococcus pneumoniae NOT DETECTED NOT DETECTED Final   Streptococcus pyogenes NOT DETECTED NOT DETECTED Final   A.calcoaceticus-baumannii NOT DETECTED NOT DETECTED Final   Bacteroides fragilis NOT DETECTED NOT DETECTED Final   Enterobacterales DETECTED (A) NOT DETECTED Final    Comment: Enterobacterales represent a large order of gram negative bacteria, not a single organism. CRITICAL RESULT CALLED  TO, READ BACK BY AND VERIFIED WITH: SCOTT HALL AT 0102 ON 02/07/20 SNG    Enterobacter cloacae complex NOT DETECTED NOT DETECTED Final   Escherichia coli DETECTED (A) NOT DETECTED Final    Comment: CRITICAL RESULT CALLED TO, READ BACK BY AND VERIFIED WITH: SCOTT HALL AT 0104 ON 02/07/20 SNG    Klebsiella aerogenes NOT DETECTED NOT DETECTED Final   Klebsiella oxytoca NOT DETECTED NOT DETECTED Final   Klebsiella pneumoniae NOT DETECTED NOT DETECTED Final   Proteus species NOT DETECTED NOT DETECTED Final   Salmonella species NOT DETECTED NOT DETECTED Final   Serratia marcescens NOT DETECTED NOT DETECTED Final   Haemophilus influenzae NOT DETECTED NOT DETECTED Final   Neisseria meningitidis NOT DETECTED NOT DETECTED Final   Pseudomonas aeruginosa NOT DETECTED NOT DETECTED Final   Stenotrophomonas maltophilia NOT DETECTED NOT DETECTED Final   Candida albicans NOT DETECTED NOT DETECTED Final   Candida auris NOT DETECTED NOT DETECTED Final   Candida glabrata NOT DETECTED NOT DETECTED Final   Candida krusei NOT DETECTED NOT DETECTED Final   Candida parapsilosis NOT DETECTED NOT DETECTED Final   Candida tropicalis NOT DETECTED NOT DETECTED Final   Cryptococcus neoformans/gattii NOT DETECTED NOT DETECTED Final   CTX-M ESBL DETECTED (A) NOT DETECTED Final    Comment: CRITICAL RESULT CALLED TO, READ BACK BY AND VERIFIED WITH: SCOTT HALL  AT 0104 ON 02/07/20 SNG (NOTE) Extended spectrum beta-lactamase detected. Recommend a carbapenem as initial therapy.      Carbapenem resistance IMP NOT DETECTED NOT DETECTED Final   Carbapenem resistance KPC NOT DETECTED NOT DETECTED Final   Carbapenem resistance NDM NOT DETECTED NOT DETECTED Final   Carbapenem resist OXA 48 LIKE NOT DETECTED NOT DETECTED Final   Carbapenem resistance VIM NOT DETECTED NOT DETECTED Final    Comment: Performed at [facility], [address] [place], [place] [address]  Urine Culture     Status: Abnormal (Preliminary result)   Collection Time: 02/06/20  8:24 AM   Specimen: Urine, Random  Result Value Ref Range Status   Specimen Description   Final    URINE, RANDOM Performed at [facility], [address], [place], [place] [address]    Special Requests   Final    NONE Performed at [facility], [address], [place], [place] [address]    Culture (A)  Final    >=100,000 COLONIES/mL ESCHERICHIA COLI SUSCEPTIBILITIES TO FOLLOW Performed at [facility], [place] [address] [place], [place] [address]    Report Status PENDING  Incomplete  Culture, blood (Routine x 2)     Status: None (Preliminary result)   Collection Time: 02/06/20  9:29 AM   Specimen: BLOOD  Result Value Ref Range Status   Specimen Description BLOOD BLOOD LEFT HAND  Final   Special Requests   Final    BOTTLES DRAWN AEROBIC AND ANAEROBIC Blood Culture adequate volume   Culture   Final    NO GROWTH 2 DAYS Performed at [facility], [address], [place], [place] [address]    Report Status PENDING  Incomplete  Respiratory Panel by RT PCR (Flu A&B, Covid) - Nasopharyngeal Swab     Status: None   Collection Time: 02/06/20  9:29 AM   Specimen: Nasopharyngeal Swab  Result Value Ref Range Status   SARS Coronavirus 2 by RT PCR NEGATIVE NEGATIVE Final    Comment: (NOTE) SARS-CoV-2 target nucleic acids are NOT DETECTED.  The SARS-CoV-2  RNA is generally detectable  in upper respiratoy specimens during the acute phase of infection. The lowest concentration of SARS-CoV-2 viral copies this assay can detect is 131 copies/mL. A negative result does not preclude SARS-Cov-2 infection and should not be used as the sole basis for treatment or other patient management decisions. A negative result may occur with  improper specimen collection/handling, submission of specimen other than nasopharyngeal swab, presence of viral mutation(s) within the areas targeted by this assay, and inadequate number of viral copies (<131 copies/mL). A negative result must be combined with clinical observations, patient history, and epidemiological information. The expected result is Negative.  Fact Sheet for Patients:  PinkCheek.be  Fact Sheet for Healthcare Providers:  GravelBags.it  This test is no t yet approved or cleared by the [place] FDA and  has been authorized for detection and/or diagnosis of SARS-CoV-2 by FDA under an Emergency Use Authorization (EUA). This EUA will remain  in effect (meaning this test can be used) for the duration of the COVID-19 declaration under Section 564(b)(1) of the Act, 21 U.S.C. section 360bbb-3(b)(1), unless the authorization is terminated or revoked sooner.     Influenza A by PCR NEGATIVE NEGATIVE Final   Influenza B by PCR NEGATIVE NEGATIVE Final    Comment: (NOTE) The Xpert Xpress SARS-CoV-2/FLU/RSV assay is intended as an aid in  the diagnosis of influenza from Nasopharyngeal swab specimens and  should not be used as a sole basis for treatment. Nasal washings and  aspirates are unacceptable for Xpert Xpress SARS-CoV-2/FLU/RSV  testing.  Fact Sheet for Patients: PinkCheek.be  Fact Sheet for Healthcare Providers: GravelBags.it  This test is not yet approved or cleared by the  [place] FDA and  has been authorized for detection and/or diagnosis of SARS-CoV-2 by  FDA under an Emergency Use Authorization (EUA). This EUA will remain  in effect (meaning this test can be used) for the duration of the  Covid-19 declaration under Section 564(b)(1) of the Act, 21  U.S.C. section 360bbb-3(b)(1), unless the authorization is  terminated or revoked. Performed at [facility], [address], [place], [place] [address]          Radiology Studies: No results found.      Scheduled Meds: . alfuzosin  10 mg Oral Q breakfast  . enoxaparin (LOVENOX) injection  50 mg Subcutaneous Q24H  . multivitamin with minerals  1 tablet Oral Daily  . omega-3 acid ethyl esters  1 g Oral Daily   Continuous Infusions: . 0.9 % NaCl with KCl 20 mEq / L 125 mL/hr at 02/08/20 1228  . meropenem (MERREM) IV 1 g (02/08/20 1338)     LOS: 2 days    Time spent: 20 min    Desma Maxim, MD [facility]   If 7PM-7AM, please contact night-coverage [email or web address] Password [email or web address] 02/08/2020, 1:49 PM

## 2020-02-09 ENCOUNTER — Inpatient Hospital Stay: Payer: Self-pay

## 2020-02-09 ENCOUNTER — Inpatient Hospital Stay: Payer: PPO

## 2020-02-09 DIAGNOSIS — B9620 Unspecified Escherichia coli [E. coli] as the cause of diseases classified elsewhere: Secondary | ICD-10-CM | POA: Diagnosis not present

## 2020-02-09 DIAGNOSIS — A498 Other bacterial infections of unspecified site: Secondary | ICD-10-CM

## 2020-02-09 DIAGNOSIS — N390 Urinary tract infection, site not specified: Secondary | ICD-10-CM | POA: Diagnosis not present

## 2020-02-09 DIAGNOSIS — R7881 Bacteremia: Secondary | ICD-10-CM | POA: Diagnosis not present

## 2020-02-09 DIAGNOSIS — Z1612 Extended spectrum beta lactamase (ESBL) resistance: Secondary | ICD-10-CM

## 2020-02-09 LAB — CBC
HCT: 41 % (ref 39.0–52.0)
Hemoglobin: 13.9 g/dL (ref 13.0–17.0)
MCH: 29.3 pg (ref 26.0–34.0)
MCHC: 33.9 g/dL (ref 30.0–36.0)
MCV: 86.3 fL (ref 80.0–100.0)
Platelets: 212 10*3/uL (ref 150–400)
RBC: 4.75 MIL/uL (ref 4.22–5.81)
RDW: 13.1 % (ref 11.5–15.5)
WBC: 8 10*3/uL (ref 4.0–10.5)
nRBC: 0 % (ref 0.0–0.2)

## 2020-02-09 LAB — BASIC METABOLIC PANEL
Anion gap: 9 (ref 5–15)
BUN: 14 mg/dL (ref 8–23)
CO2: 26 mmol/L (ref 22–32)
Calcium: 8.3 mg/dL — ABNORMAL LOW (ref 8.9–10.3)
Chloride: 102 mmol/L (ref 98–111)
Creatinine, Ser: 0.78 mg/dL (ref 0.61–1.24)
GFR, Estimated: >60 mL/min (ref >60)
Glucose, Bld: 124 mg/dL — ABNORMAL HIGH (ref 70–99)
Potassium: 3.6 mmol/L (ref 3.5–5.1)
Sodium: 137 mmol/L (ref 135–145)

## 2020-02-09 LAB — URINE CULTURE: Culture: >=100000 — AB

## 2020-02-09 LAB — CULTURE, BLOOD (ROUTINE X 2): Special Requests: ADEQUATE

## 2020-02-09 IMAGING — US US RENAL
1 series · 15 of 25 positions shown · non-contrast
Comparison: CT of the abdomen pelvis [DATE]

CLINICAL DATA: Follow-up abnormal CT.  Left-sided pyelonephritis.

EXAM:
RENAL / URINARY TRACT ULTRASOUND COMPLETE

[Series 1: us renal · 40 acquisitions, 15 frames shown]
[im 1/40]
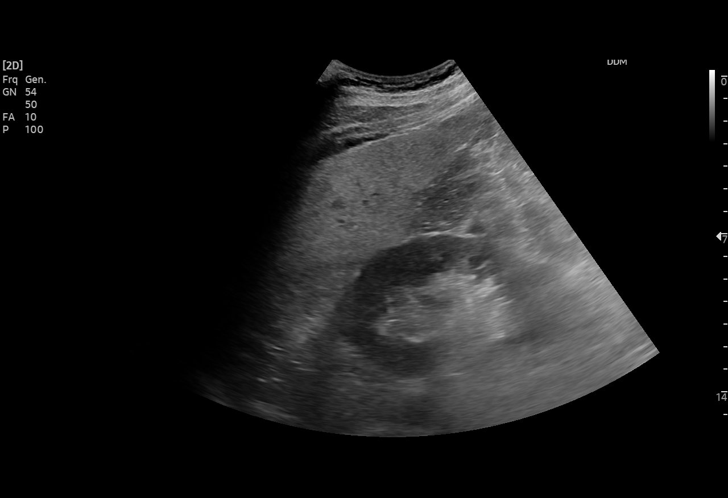
[im 4/40]
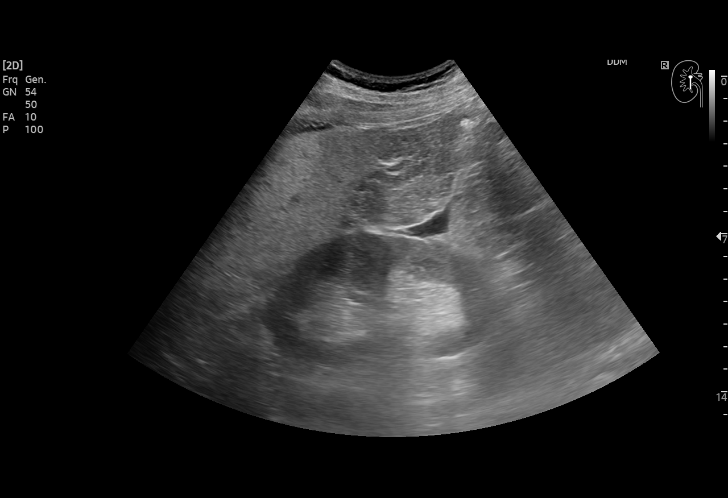
[im 7/40]
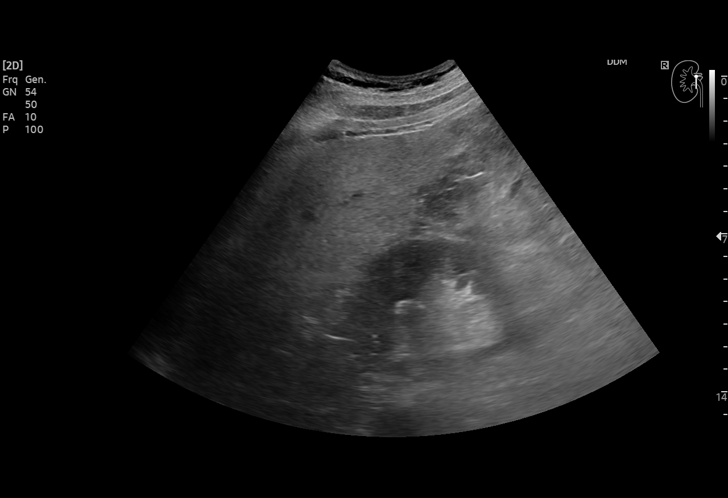
[im 9/40]
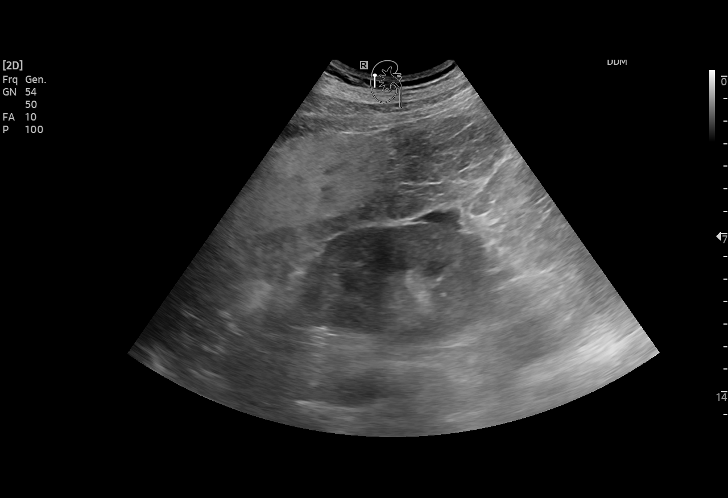
[im 12/40]
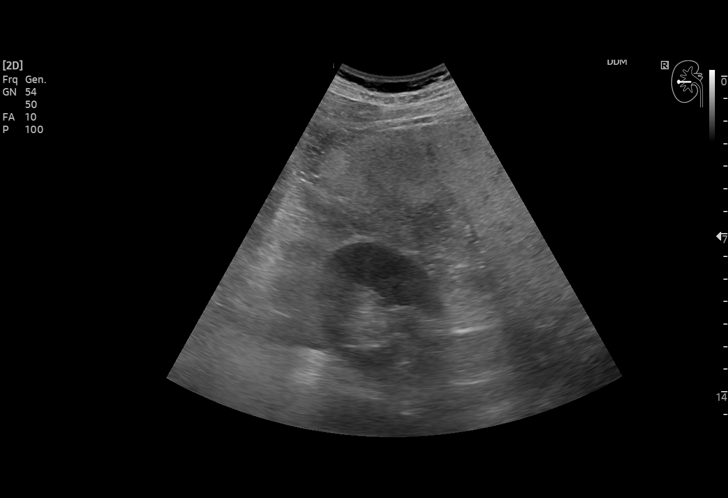
[im 15/40]
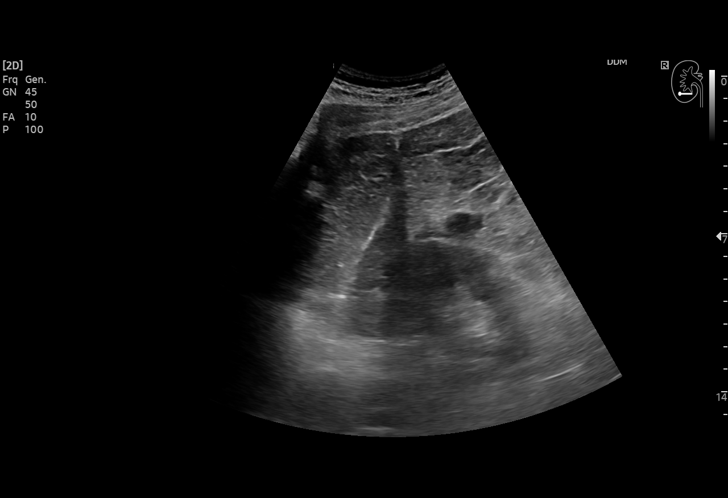
[im 17/40]
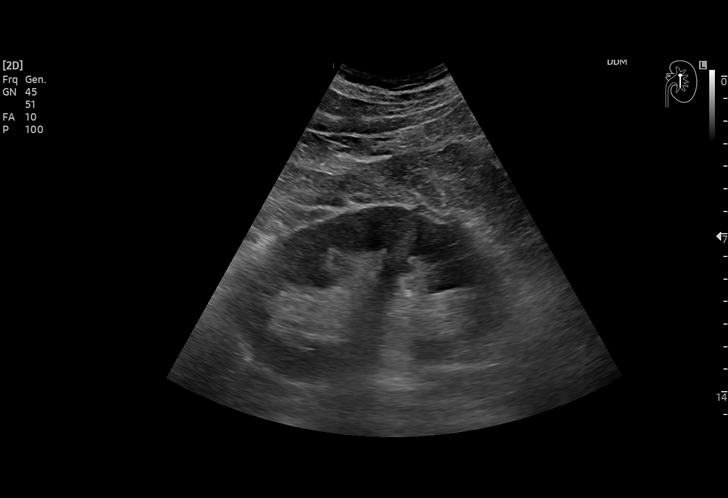
[im 20/40]
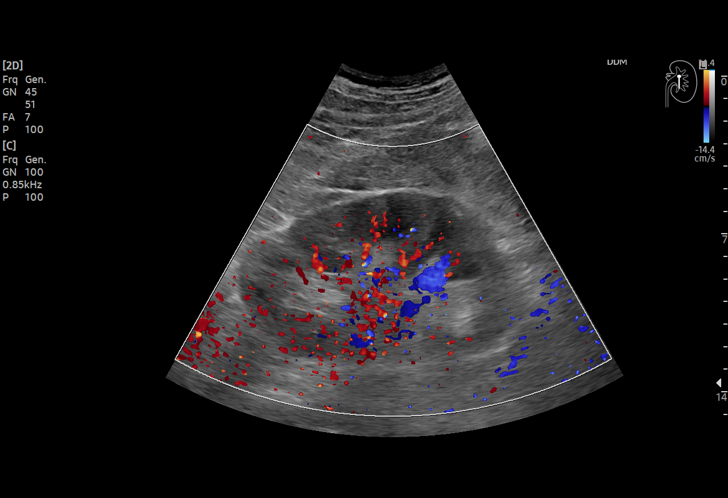
[im 23/40]
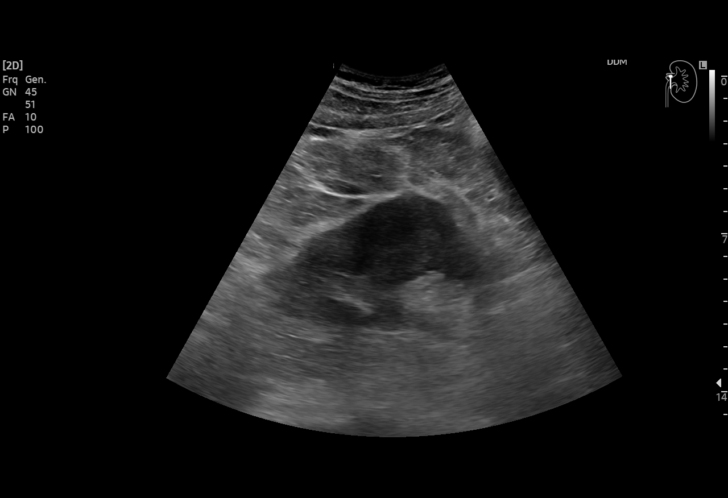
[im 25/40]
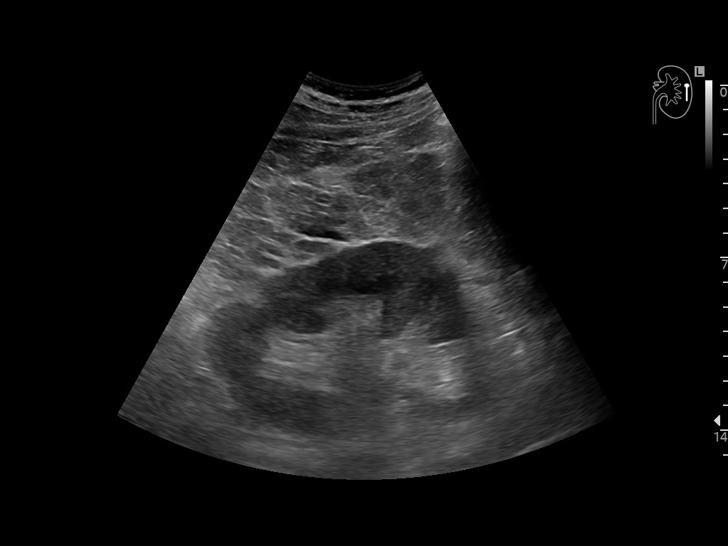
[im 28/40]
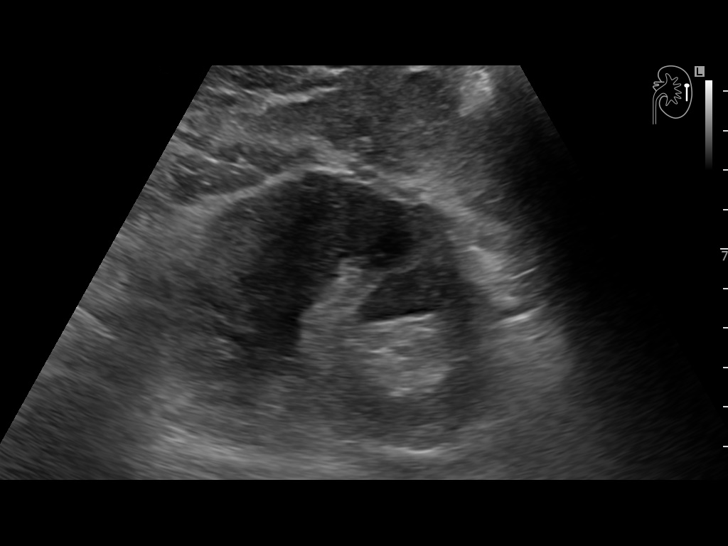
[im 31/40]
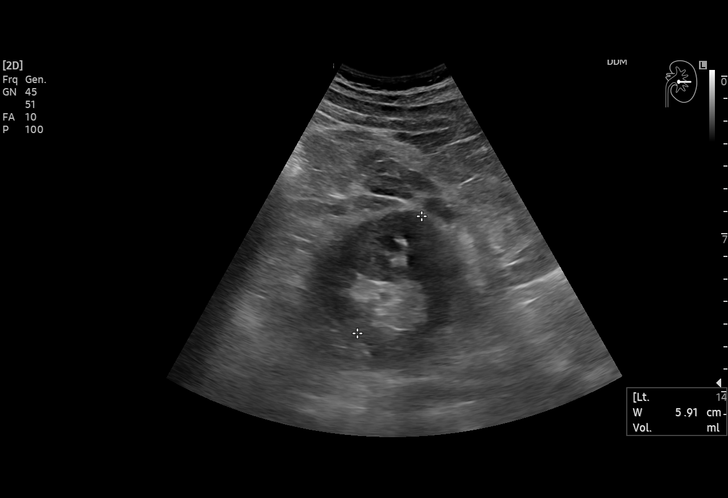
[im 33/40]
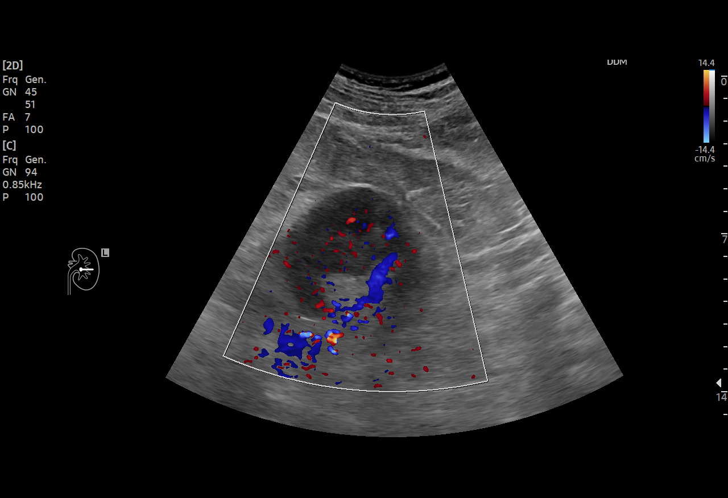
[im 36/40]
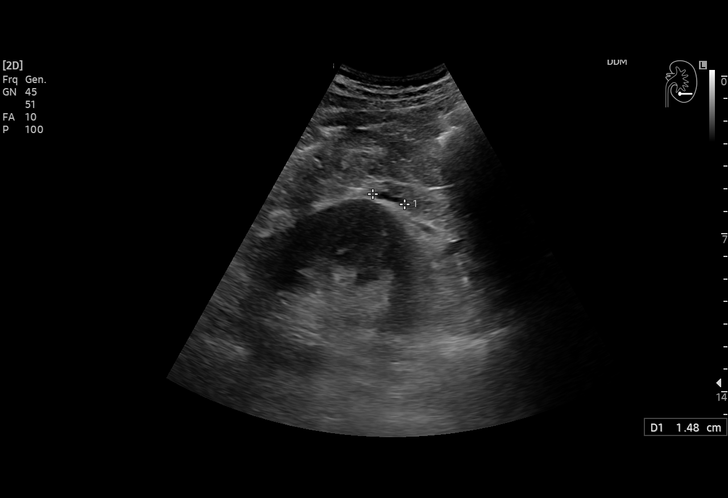
[im 40/40]
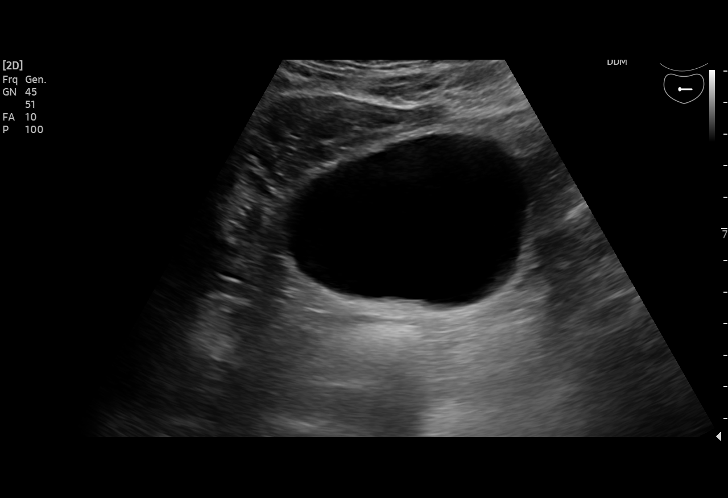

[15 of 25 positions shown; findings below may reference images not displayed]

FINDINGS: Right Kidney:

Renal measurements: 11.1 x 5.7 x 5.3 cm = volume: 176 mL.
Echogenicity within normal limits. No mass or hydronephrosis
visualized.

Left Kidney:

Renal measurements: 12.7 x 6.7 x 5.9 cm = volume: 263 mL mL. Focal
hypoechoic region measures 2.9 x 2.3 x 3.3 cm in the interpolar
region of the left kidney. Trace amount fluid is present adjacent to
the kidney at this same level. This corresponds with the area
hypoperfusion the CT scan. No abscess is present.

Bladder:

Appears normal for degree of bladder distention.

Other:

None.
IMPRESSION: 1. 3.3 x 2.3 x 3.3 cm hypoechoic region in the interpolar region of
the left kidney. This corresponds to the area of suspected
pyelonephritis on the prior exam. No abscess is present.
2. Normal sonographic appearance of the right kidney and bladder.

## 2020-02-09 MED ORDER — SODIUM CHLORIDE 0.9 % IV SOLN
1.0000 g | INTRAVENOUS | Status: DC
  Administered 2020-02-10: 1000 mg via INTRAVENOUS
  Filled 2020-02-09: qty 1

## 2020-02-09 MED ORDER — DUTASTERIDE 0.5 MG PO CAPS
0.5000 mg | ORAL_CAPSULE | Freq: Every day | ORAL | Status: DC
  Administered 2020-02-09 – 2020-02-10 (×2): 0.5 mg via ORAL
  Filled 2020-02-09 (×2): qty 1

## 2020-02-09 NOTE — Care Management Important Message (Signed)
Important Message  Patient Details  Name: KHARTER BREW MRN: [id number] Date of Birth: [birth year]   Medicare Important Message Given:  Yes  Reviewed with patient via room phone due to isolation status.  Declined copy of Medicare IM at this time as wife has one already.     Dannette Barbara 02/09/2020, 11:29 AM

## 2020-02-09 NOTE — Progress Notes (Signed)
Transferred patient via wheelchair to room 238.

## 2020-02-09 NOTE — Progress Notes (Signed)
Called and gave report to [place], RN on [place]. Patient is going to room 238.

## 2020-02-09 NOTE — Progress Notes (Signed)
PROGRESS NOTE    Martin Hanna  XLK:[id number] DOB: 1955/04/12 DOA: 02/06/2020 PCP: Cyndi Bender, PA-C  Outpatient Specialists: [facility] in [place]    Brief Narrative:    Martin Hanna is a 64 y.o. male with medical history significant for hypertension, obesity, dyslipidemia, prostate disease and sleep apnea presents to the emergency room for evaluation of a fever which he has had intermittently for the last 3 days.  Patient states that he has been treated with antibiotics as an outpatient for UTI and appears to have completed a course of ciprofloxacin and Augmentin without improvement in his symptoms.  He was recently seen by an infectious disease physician on 02/04/20 for ESBL positive E. coli in his urine but was not placed on any further antibiotic therapy.  Patient complains of a fever with a T-max of 103 F associated with feeling of incomplete voiding, poor urine stream and urinary dribbling.  He denies having any frequency, dysuria or nocturia. He was referred to urology but was not able to keep that appointment because he did not feel well.  He also complains of nausea and poor oral intake but denies having any vomiting, no abdominal pain, no changes in his bowel habits, no dizziness, no lightheadedness, no shortness of breath, no chest pain, no palpitations. Labs show sodium one thirty-three, potassium 3.8, chloride ninety-five, bicarb twenty-five, glucose two oh four, BUN eighteen, creatinine 1.02, calcium 8.6, alkaline phosphatase fifty-two, albumin 3.8, AST twenty-two, ALT twenty-three, total protein 7.6, lactic acid 2.0, white count 19.0, hemoglobin 15.9, hematocrit 46.5, MCV 86.8, RDW 13.0, platelet count 193, PT 13.5, INR 1.1 Respiratory viral panel is negative Urine analysis is positive for nitrites and leukocyte esterase with many bacteria. CT scan of abdomen and pelvis shows areas of inhomogeneous enhancement in the left kidney as well as perinephric stranding.  No well-defined fluid in the perinephric fascia on the left. This appearance is felt to be indicative of bacterial nephritis/acute lobar nephronia. Appropriate laboratory assessment in this regard advised. No hydronephrosis on either side. No evident renal or ureteral calculus on either side. Urinary bladder wall thickness normal.  ED Course: Patient is a 64 year old Caucasian male who presents to the ER for evaluation of a fever and is noted to be septic from a urinary source.  Patient has failed outpatient antibiotic therapy with ciprofloxacin and Augmentin and had a urine culture which yielded ESBL positive E. coli.  He received sepsis IV fluid requirement in the ER and a dose of cefepime and will be admitted to the hospital for further evaluation.   Assessment & Plan:   Principal Problem:   Sepsis ([place]) Active Problems:   Essential hypertension   UTI due to extended-spectrum beta lactamase (ESBL) producing Escherichia coli   Hypercholesteremia   Bacterial nephritis  # Sepsis from a urinary source (POA) # Bacteremia As evidenced by fever with a T-max of 100F, tachycardia and tachypnea with source of sepsis from the urinary system associated with elevated lactic acid and marked leukocytosis of 19,000 with a left shift. Patient has a history of ESBL positive E. coli in his urine and was treated as an outpatient with oral antibiotics (cipro, augmentin) and appears to have failed outpatient treatment. Blood culture and urine culture growing ESBL e. Coli. CT shows left sided pyelo, no obstruction or abscess. Hx recurrent UTIs. Much improved - continue meropenem, ID consulted for abx choice and duration - plan for picc tomorrow - f/u renal u/s ordered by ID to assess for abscess,  unlikely given clinical and laboratory improvement - f/u bladder scan to assess for retention  # Hypertension BP mildly elevated - will re-start home valsartan (will substitute formulary irbesartan) - holding home  hctzfor now  # hypokalemia  Resolved  # Dyslipidemia - Continue Lovaza  # History of BPH Possible urethral stricture after traumatic foley. Followed by [facility] in [place]. UOP is normal, denies symptoms of retention. - cont alfuzosin, wife today says also on dutasteride at home, will re-start that - bladder scan as above - close outpt f/u, this is likely the etiology of recurrent UTIs.  # OSA Not on cpap at home - outpt f/u   DVT prophylaxis: lovenox Code Status: full Family Communication: wife updated @ bedside 11/15  Status is: Inpatient  Remains inpatient appropriate because:Inpatient level of care appropriate due to severity of illness   Dispo: The patient is from: Home              Anticipated d/c is to: Home              Anticipated d/c date is: 1 day              Patient currently is not medically stable to d/c.        Consultants:  Urology, ID  Procedures: none  Antimicrobials:  Cefepime 11/12 Meropenem 11/12>    Subjective: Feels well. No body aches, fevers. Has appetite. No vomiting or diarrhea.  Objective: Vitals:   02/09/20 0128 02/09/20 0551 02/09/20 0756 02/09/20 1151  BP: (!) 163/95 (!) 168/95 (!) 163/104 (!) 141/89  Pulse: 82 77 78 68  Resp: 20  16 16   Temp: 98.7 F (37.1 C) (!) 97.5 F (36.4 C) 98.9 F (37.2 C) 98.9 F (37.2 C)  TempSrc: Oral Oral Oral Oral  SpO2: 94% 100% 93% 94%  Weight:  98.3 kg    Height:        Intake/Output Summary (Last 24 hours) at 02/09/2020 1321 Last data filed at 02/08/2020 1912 Gross per 24 hour  Intake 4081.74 ml  Output 950 ml  Net 3131.74 ml   Filed Weights   02/07/20 0523 02/08/20 0622 02/09/20 0551  Weight: 99.8 kg 100.8 kg 98.3 kg    Examination:  General exam: Appears calm and comfortable  Respiratory system: Clear to auscultation. Respiratory effort normal. Cardiovascular system: S1 & S2 heard, RRR. No JVD, murmurs, rubs, gallops or clicks. No pedal  edema. Gastrointestinal system: Abdomen is nondistended, obese, soft and nontender. No organomegaly or masses felt. Normal bowel sounds heard. Central nervous system: Alert and oriented. No focal neurological deficits. Extremities: Symmetric 5 x 5 power. Skin: No rashes, lesions or ulcers Psychiatry: Judgement and insight appear normal. Mood & affect appropriate.     Data Reviewed: I have personally reviewed following labs and imaging studies  CBC: Recent Labs  Lab 02/06/20 0821 02/07/20 0713 02/08/20 0503 02/09/20 0347  WBC 19.0* 11.8* 8.4 8.0  NEUTROABS 16.5*  --   --   --   HGB 15.9 14.3 13.0 13.9  HCT 46.5 43.0 38.2* 41.0  MCV 86.8 89.0 86.8 86.3  PLT 193 172 167 809   Basic Metabolic Panel: Recent Labs  Lab 02/06/20 0821 02/07/20 0713 02/08/20 0503 02/09/20 0347  NA 133* 137 134* 137  K 3.8 3.4* 3.3* 3.6  CL 95* 100 100 102  CO2 25 28 26 26   GLUCOSE 204* 139* 116* 124*  BUN 18 15 16 14   CREATININE 1.02 0.85 0.72 0.78  CALCIUM  8.6* 8.0* 7.6* 8.3*  MG  --   --  2.1  --    GFR: Estimated Creatinine Clearance: 97.9 mL/min (by C-G formula based on SCr of 0.78 mg/dL). Liver Function Tests: Recent Labs  Lab 02/06/20 0821  AST 22  ALT 23  ALKPHOS 52  BILITOT 0.8  PROT 7.6  ALBUMIN 3.8   No results for input(s): LIPASE, AMYLASE in the last 168 hours. No results for input(s): AMMONIA in the last 168 hours. Coagulation Profile: Recent Labs  Lab 02/06/20 0821 02/07/20 0713  INR 1.1 1.2   Cardiac Enzymes: No results for input(s): CKTOTAL, CKMB, CKMBINDEX, TROPONINI in the last 168 hours. BNP (last 3 results) No results for input(s): PROBNP in the last 8760 hours. HbA1C: No results for input(s): HGBA1C in the last 72 hours. CBG: No results for input(s): GLUCAP in the last 168 hours. Lipid Profile: No results for input(s): CHOL, HDL, LDLCALC, TRIG, CHOLHDL, LDLDIRECT in the last 72 hours. Thyroid Function Tests: No results for input(s): TSH, T4TOTAL,  FREET4, T3FREE, THYROIDAB in the last 72 hours. Anemia Panel: No results for input(s): VITAMINB12, FOLATE, FERRITIN, TIBC, IRON, RETICCTPCT in the last 72 hours. Urine analysis:    Component Value Date/Time   COLORURINE AMBER (A) 02/06/2020 0824   APPEARANCEUR CLOUDY (A) 02/06/2020 0824   LABSPEC 1.026 02/06/2020 0824   PHURINE 5.0 02/06/2020 0824   GLUCOSEU NEGATIVE 02/06/2020 0824   HGBUR LARGE (A) 02/06/2020 0824   BILIRUBINUR NEGATIVE 02/06/2020 0824   KETONESUR 5 (A) 02/06/2020 0824   PROTEINUR 100 (A) 02/06/2020 0824   NITRITE POSITIVE (A) 02/06/2020 0824   LEUKOCYTESUR MODERATE (A) 02/06/2020 0824   Sepsis Labs: @LABRCNTIP (procalcitonin:4,lacticidven:4)  ) Recent Results (from the past 240 hour(s))  Culture, blood (Routine x 2)     Status: Abnormal   Collection Time: 02/06/20  8:21 AM   Specimen: BLOOD  Result Value Ref Range Status   Specimen Description   Final    BLOOD RIGHT ANTECUBITAL Performed at [facility], [address], [place], [place] [address]    Special Requests   Final    BOTTLES DRAWN AEROBIC AND ANAEROBIC Blood Culture adequate volume Performed at [facility], [address] [place], [place], [place] [address]    Culture  Setup Time   Final    Organism ID to follow [place] TO, READ BACK BY AND VERIFIED WITH: [place] ON 02/07/20 [place] Performed at [facility], [address] [place], [place] [address]    Culture ESCHERICHIA COLI (A)  Final   Report Status 02/09/2020 FINAL  Final   Organism ID, Bacteria ESCHERICHIA COLI  Final      Susceptibility   Escherichia coli - MIC*    AMPICILLIN >=32 RESISTANT Resistant     CEFAZOLIN >=64 RESISTANT Resistant     CEFEPIME 16 RESISTANT Resistant     CEFTAZIDIME RESISTANT Resistant     CEFTRIAXONE >=64 RESISTANT Resistant     CIPROFLOXACIN >=4 RESISTANT Resistant     GENTAMICIN <=1 SENSITIVE Sensitive     IMIPENEM  <=0.25 SENSITIVE Sensitive     TRIMETH/SULFA >=320 RESISTANT Resistant     AMPICILLIN/SULBACTAM 4 SENSITIVE Sensitive     PIP/TAZO <=4 SENSITIVE Sensitive     * ESCHERICHIA COLI  Blood Culture ID Panel (Reflexed)     Status: Abnormal   Collection Time: 02/06/20  8:21 AM  Result Value Ref Range Status   Enterococcus faecalis NOT DETECTED  NOT DETECTED Final   Enterococcus Faecium NOT DETECTED NOT DETECTED Final   Listeria monocytogenes NOT DETECTED NOT DETECTED Final   Staphylococcus species NOT DETECTED NOT DETECTED Final   Staphylococcus aureus (BCID) NOT DETECTED NOT DETECTED Final   Staphylococcus epidermidis NOT DETECTED NOT DETECTED Final   Staphylococcus lugdunensis NOT DETECTED NOT DETECTED Final   Streptococcus species NOT DETECTED NOT DETECTED Final   Streptococcus agalactiae NOT DETECTED NOT DETECTED Final   Streptococcus pneumoniae NOT DETECTED NOT DETECTED Final   Streptococcus pyogenes NOT DETECTED NOT DETECTED Final   A.calcoaceticus-baumannii NOT DETECTED NOT DETECTED Final   Bacteroides fragilis NOT DETECTED NOT DETECTED Final   Enterobacterales DETECTED (A) NOT DETECTED Final    Comment: Enterobacterales represent a large order of gram negative bacteria, not a single organism. CRITICAL RESULT CALLED TO, READ BACK BY AND VERIFIED WITH: SCOTT HALL AT 0102 ON 02/07/20 SNG    Enterobacter cloacae complex NOT DETECTED NOT DETECTED Final   Escherichia coli DETECTED (A) NOT DETECTED Final    Comment: CRITICAL RESULT CALLED TO, READ BACK BY AND VERIFIED WITH: SCOTT HALL AT 0104 ON 02/07/20 SNG    Klebsiella aerogenes NOT DETECTED NOT DETECTED Final   Klebsiella oxytoca NOT DETECTED NOT DETECTED Final   Klebsiella pneumoniae NOT DETECTED NOT DETECTED Final   Proteus species NOT DETECTED NOT DETECTED Final   Salmonella species NOT DETECTED NOT DETECTED Final   Serratia marcescens NOT DETECTED NOT DETECTED Final   Haemophilus influenzae NOT DETECTED NOT DETECTED Final    Neisseria meningitidis NOT DETECTED NOT DETECTED Final   Pseudomonas aeruginosa NOT DETECTED NOT DETECTED Final   Stenotrophomonas maltophilia NOT DETECTED NOT DETECTED Final   Candida albicans NOT DETECTED NOT DETECTED Final   Candida auris NOT DETECTED NOT DETECTED Final   Candida glabrata NOT DETECTED NOT DETECTED Final   Candida krusei NOT DETECTED NOT DETECTED Final   Candida parapsilosis NOT DETECTED NOT DETECTED Final   Candida tropicalis NOT DETECTED NOT DETECTED Final   Cryptococcus neoformans/gattii NOT DETECTED NOT DETECTED Final   CTX-M ESBL DETECTED (A) NOT DETECTED Final    Comment: CRITICAL RESULT CALLED TO, READ BACK BY AND VERIFIED WITH: SCOTT HALL AT 0104 ON 02/07/20 SNG (NOTE) Extended spectrum beta-lactamase detected. Recommend a carbapenem as initial therapy.      Carbapenem resistance IMP NOT DETECTED NOT DETECTED Final   Carbapenem resistance KPC NOT DETECTED NOT DETECTED Final   Carbapenem resistance NDM NOT DETECTED NOT DETECTED Final   Carbapenem resist OXA 48 LIKE NOT DETECTED NOT DETECTED Final   Carbapenem resistance VIM NOT DETECTED NOT DETECTED Final    Comment: Performed at [facility], [address] [place], [place] [address]  Urine Culture     Status: Abnormal   Collection Time: 02/06/20  8:24 AM   Specimen: Urine, Random  Result Value Ref Range Status   Specimen Description   Final    URINE, RANDOM Performed at [facility], [address] [place], [place] [address]    Special Requests   Final    NONE Performed at [facility], [address] [place], [place] [address]    Culture (A)  Final    >=100,000 COLONIES/mL ESCHERICHIA COLI Confirmed Extended Spectrum Beta-Lactamase Producer (ESBL).  In bloodstream infections from ESBL organisms, carbapenems are preferred over piperacillin/tazobactam. They are shown to have a lower risk of mortality.    Report Status 02/09/2020 FINAL  Final   Organism ID, Bacteria  ESCHERICHIA COLI (A)  Final  Susceptibility   Escherichia coli - MIC*    AMPICILLIN >=32 RESISTANT Resistant     CEFAZOLIN >=64 RESISTANT Resistant     CEFEPIME 2 SENSITIVE Sensitive     CEFTRIAXONE >=64 RESISTANT Resistant     CIPROFLOXACIN >=4 RESISTANT Resistant     GENTAMICIN <=1 SENSITIVE Sensitive     IMIPENEM <=0.25 SENSITIVE Sensitive     NITROFURANTOIN <=16 SENSITIVE Sensitive     TRIMETH/SULFA >=320 RESISTANT Resistant     AMPICILLIN/SULBACTAM 4 SENSITIVE Sensitive     PIP/TAZO <=4 SENSITIVE Sensitive     * >=100,000 COLONIES/mL ESCHERICHIA COLI  Culture, blood (Routine x 2)     Status: None (Preliminary result)   Collection Time: 02/06/20  9:29 AM   Specimen: BLOOD  Result Value Ref Range Status   Specimen Description BLOOD BLOOD LEFT HAND  Final   Special Requests   Final    BOTTLES DRAWN AEROBIC AND ANAEROBIC Blood Culture adequate volume   Culture   Final    NO GROWTH 3 DAYS Performed at [facility], [address], [place], [place] [address]    Report Status PENDING  Incomplete  Respiratory Panel by RT PCR (Flu A&B, Covid) - Nasopharyngeal Swab     Status: None   Collection Time: 02/06/20  9:29 AM   Specimen: Nasopharyngeal Swab  Result Value Ref Range Status   SARS Coronavirus 2 by RT PCR NEGATIVE NEGATIVE Final    Comment: (NOTE) SARS-CoV-2 target nucleic acids are NOT DETECTED.  The SARS-CoV-2 RNA is generally detectable in upper respiratoy specimens during the acute phase of infection. The lowest concentration of SARS-CoV-2 viral copies this assay can detect is 131 copies/mL. A negative result does not preclude SARS-Cov-2 infection and should not be used as the sole basis for treatment or other patient management decisions. A negative result may occur with  improper specimen collection/handling, submission of specimen other than nasopharyngeal swab, presence of viral mutation(s) within the areas targeted by this assay, and inadequate number  of viral copies (<131 copies/mL). A negative result must be combined with clinical observations, patient history, and epidemiological information. The expected result is Negative.  Fact Sheet for Patients:  PinkCheek.be  Fact Sheet for Healthcare Providers:  GravelBags.it  This test is no t yet approved or cleared by the [place] FDA and  has been authorized for detection and/or diagnosis of SARS-CoV-2 by FDA under an Emergency Use Authorization (EUA). This EUA will remain  in effect (meaning this test can be used) for the duration of the COVID-19 declaration under Section 564(b)(1) of the Act, 21 U.S.C. section 360bbb-3(b)(1), unless the authorization is terminated or revoked sooner.     Influenza A by PCR NEGATIVE NEGATIVE Final   Influenza B by PCR NEGATIVE NEGATIVE Final    Comment: (NOTE) The Xpert Xpress SARS-CoV-2/FLU/RSV assay is intended as an aid in  the diagnosis of influenza from Nasopharyngeal swab specimens and  should not be used as a sole basis for treatment. Nasal washings and  aspirates are unacceptable for Xpert Xpress SARS-CoV-2/FLU/RSV  testing.  Fact Sheet for Patients: PinkCheek.be  Fact Sheet for Healthcare Providers: GravelBags.it  This test is not yet approved or cleared by the [place] FDA and  has been authorized for detection and/or diagnosis of SARS-CoV-2 by  FDA under an Emergency Use Authorization (EUA). This EUA will remain  in effect (meaning this test can be used) for the duration of the  Covid-19 declaration under Section 564(b)(1) of the Act, 21  U.S.C. section 360bbb-3(b)(1), unless the authorization is  terminated or revoked. Performed at [facility], [address] [place], [place] [address]   CULTURE, BLOOD (ROUTINE X 2) w Reflex to ID Panel     Status: None (Preliminary result)   Collection Time:  02/08/20  2:30 PM   Specimen: BLOOD  Result Value Ref Range Status   Specimen Description BLOOD LAC  Final   Special Requests   Final    BOTTLES DRAWN AEROBIC AND ANAEROBIC Blood Culture adequate volume   Culture   Final    NO GROWTH < 24 HOURS Performed at [facility], [address], [place], [place] [address]    Report Status PENDING  Incomplete  CULTURE, BLOOD (ROUTINE X 2) w Reflex to ID Panel     Status: None (Preliminary result)   Collection Time: 02/08/20  2:34 PM   Specimen: BLOOD  Result Value Ref Range Status   Specimen Description BLOOD RIGHT HAND  Final   Special Requests   Final    BOTTLES DRAWN AEROBIC AND ANAEROBIC Blood Culture adequate volume   Culture   Final    NO GROWTH < 24 HOURS Performed at [facility], [address], [place], [place] [address]    Report Status PENDING  Incomplete         Radiology Studies: [place] EKG SITE RITE  Result Date: 02/09/2020 If Site Rite image not attached, placement could not be confirmed due to current cardiac rhythm.       Scheduled Meds: . alfuzosin  10 mg Oral Q breakfast  . enoxaparin (LOVENOX) injection  50 mg Subcutaneous Q24H  . irbesartan  150 mg Oral Daily  . multivitamin with minerals  1 tablet Oral Daily  . omega-3 acid ethyl esters  1 g Oral Daily   Continuous Infusions: . meropenem (MERREM) IV 1 g (02/09/20 0634)     LOS: 3 days    Time spent: 20 min    Desma Maxim, MD [facility]   If 7PM-7AM, please contact night-coverage [email or web address] Password [email or web address] 02/09/2020, 1:21 PM

## 2020-02-09 NOTE — Progress Notes (Addendum)
Patient voided, the post residual scan done about 15 minutes post void. Bladder scan showed 21ml
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
## 2020-02-10 LAB — CBC
HCT: 42.5 % (ref 39.0–52.0)
Hemoglobin: 14.3 g/dL (ref 13.0–17.0)
MCH: 29.1 pg (ref 26.0–34.0)
MCHC: 33.6 g/dL (ref 30.0–36.0)
MCV: 86.6 fL (ref 80.0–100.0)
Platelets: 263 10*3/uL (ref 150–400)
RBC: 4.91 MIL/uL (ref 4.22–5.81)
RDW: 13.2 % (ref 11.5–15.5)
WBC: 7.6 10*3/uL (ref 4.0–10.5)
nRBC: 0 % (ref 0.0–0.2)

## 2020-02-10 LAB — BASIC METABOLIC PANEL
Anion gap: 8 (ref 5–15)
BUN: 16 mg/dL (ref 8–23)
CO2: 28 mmol/L (ref 22–32)
Calcium: 7.9 mg/dL — ABNORMAL LOW (ref 8.9–10.3)
Chloride: 102 mmol/L (ref 98–111)
Creatinine, Ser: 0.84 mg/dL (ref 0.61–1.24)
GFR, Estimated: >60 mL/min (ref >60)
Glucose, Bld: 117 mg/dL — ABNORMAL HIGH (ref 70–99)
Potassium: 3.5 mmol/L (ref 3.5–5.1)
Sodium: 138 mmol/L (ref 135–145)

## 2020-02-10 MED ORDER — SODIUM CHLORIDE 0.9% FLUSH: 10.0000 mL | Freq: Two times a day (BID) | INTRAVENOUS | Status: DC

## 2020-02-10 MED ORDER — ERTAPENEM IV (FOR PTA / DISCHARGE USE ONLY): 1.0000 g | INTRAVENOUS | 0 refills | Status: AC

## 2020-02-10 MED ORDER — SODIUM CHLORIDE 0.9% FLUSH: 10.0000 mL | INTRAVENOUS | Status: DC | PRN

## 2020-02-10 MED ORDER — CHLORHEXIDINE GLUCONATE CLOTH 2 % EX PADS: 6.0000 | MEDICATED_PAD | Freq: Every day | CUTANEOUS | Status: DC

## 2020-02-10 MED ORDER — SODIUM CHLORIDE 0.9 % IV SOLN
INTRAVENOUS | Status: DC | PRN
  Administered 2020-02-10: 500 mL via INTRAVENOUS

## 2020-02-10 NOTE — Discharge Summary (Signed)
Martin Hanna OIN:[id number] DOB: [birth year] DOA: 02/06/2020  PCP: No primary care provider on file.  Admit date: 02/06/2020 Discharge date: 02/10/2020  Time spent: 35 minutes  Recommendations for Outpatient Follow-up:  1. Close outpatient urology f/u  2. Weekly cbc/cmp, home health to obtain and send to ID    Discharge Diagnoses:  Principal Problem:   Sepsis ([place]) Active Problems:   Essential hypertension   UTI due to extended-spectrum beta lactamase (ESBL) producing Escherichia coli   Hypercholesteremia   Bacterial nephritis   Discharge Condition: good  Diet recommendation: regular  Filed Weights   02/08/20 0622 02/09/20 0551 02/10/20 0341  Weight: 100.8 kg 98.3 kg 98 kg    History of present illness:  Martin Colegrove Johnsonis a 64 y.o.malewith medical history significant forhypertension, obesity, dyslipidemia, prostate disease and sleep apnea presents to the emergency room for evaluation of a fever which he has had intermittently for the last 3 days. Patient states that he has been treated with antibiotics as an outpatient for UTI and appears to have completed a course of ciprofloxacin and Augmentin without improvement in his symptoms. He was recently seen by an infectious disease physician on 11/10/21for ESBL positive E. coli in his urine but was not placed on any further antibiotic therapy. Patient complains of a fever with a T-max of 103 Fassociated with feeling of incomplete voiding, poor urine stream and urinary dribbling. He denies having any frequency, dysuria or nocturia. He was referred to urology but was not able to keep that appointment because he did not feel well. He also complains of nausea and poor oral intake but denies having any vomiting, no abdominal pain, no changes in his bowel habits, no dizziness, no lightheadedness, no shortness of breath, no chest pain, no palpitations. Labs show sodium one thirty-three, potassium 3.8, chloride ninety-five,  bicarb twenty-five, glucose two oh four, BUN eighteen, creatinine 1.02, calcium 8.6, alkaline phosphatase fifty-two, albumin 3.8, AST twenty-two, ALT twenty-three, total protein 7.6, lactic acid 2.0, white count 19.0, hemoglobin 15.9, hematocrit 46.5, MCV 86.8, RDW 13.0, platelet count193, PT 13.5, INR 1.1 Respiratory viral panel is negative Urine analysis is positive for nitrites and leukocyte esterasewith many bacteria. CT scan of abdomen and pelvis showsareas of inhomogeneous enhancement in the left kidney as well as perinephric stranding. No well-defined fluid in the perinephric fascia on the left. This appearance is felt to be indicative of bacterial nephritis/acute lobar nephronia. Appropriate laboratory assessment in this regard advised. No hydronephrosis on either side. No evident renal or ureteral calculus on either side. Urinary bladder wall thickness normal.  Hospital Course:  # Sepsis from a urinary source(POA) # Bacteremia As evidenced by fever with a T-max of66F,tachycardia and tachypnea with source of sepsis from the urinary system associated with elevated lactic acid and marked leukocytosis of19,000with a left shift. Patient has a history of ESBL positive E. coli in his urine and was treated as an outpatient with oral antibiotics (cipro, augmentin) and appears to have failed outpatient treatment. Blood culture and urine culture growing ESBL e. Coli. CT shows left sided pyelo, no obstruction or abscess. Hx recurrent UTIs. Much improved. Bladder scan shows no urinary retention - here treated w/ meropenem, converted to ertapenem for east of outpt dosing, pic placed 11/16, plan for total 2 wks IV abx   # History of BPH Possible urethral stricture after traumatic foley. Followed by [facility] in [place]. UOP is normal, no retention on post-void bladder scan - continued home alfuzosin, dutasteride - close outpt f/u,  this is likely the etiology of recurrent UTIs.  #  OSA Not on cpap at home - outpt f/u  Procedures:  [facility] placement 11/16  Consultations:  ID  Discharge Exam: Vitals:   02/10/20 0824 02/10/20 1234  BP: (!) 149/95 (!) 147/98  Pulse: 61 63  Resp: 17 17  Temp: 98.6 F (37 C) 98.4 F (36.9 C)  SpO2: 95% 95%    General exam: Appears calm and comfortable  Respiratory system: Clear to auscultation. Respiratory effort normal. Cardiovascular system: S1 & S2 heard, RRR. No JVD, murmurs, rubs, gallops or clicks. No pedal edema. Gastrointestinal system: Abdomen is nondistended, obese, soft and nontender. No organomegaly or masses felt. Normal bowel sounds heard. Central nervous system: Alert and oriented. No focal neurological deficits. Extremities: Symmetric 5 x 5 power. Skin: No rashes, lesions or ulcers Psychiatry: Judgement and insight appear normal. Mood & affect appropriate.   Discharge Instructions   Discharge Instructions    [facility] pharmacist to adjust dose for Vancomycin, Aminoglycosides and other anti-infective therapies as requested by physician.   Complete by: As directed    [facility] to provide Cath Flo 89m   Complete by: As directed    Administer for PICC line occlusion and as ordered by physician for other access device issues.   Anaphylaxis Kit: Provided to treat any anaphylactic reaction to the medication being provided to the patient if First Dose or when requested by physician   Complete by: As directed    Epinephrine 117mml vial / amp: Administer 0.58m59m0.58ml37mubcutaneously once for moderate to severe anaphylaxis, nurse to call physician and pharmacy when reaction occurs and call 911 if needed for immediate care   Diphenhydramine 50mg18mIV vial: Administer 25-50mg 39mM PRN for first dose reaction, rash, itching, mild reaction, nurse to call physician and pharmacy when reaction occurs   Sodium Chloride 0.9% NS 500ml I16mdminister if needed for hypovolemic blood pressure drop or as  ordered by physician after call to physician with anaphylactic reaction   Change dressing on IV access line weekly and PRN   Complete by: As directed    Diet - low sodium heart healthy   Complete by: As directed    Face-to-face encounter (required for Medicare/Medicaid patients)   Complete by: As directed    I Medora Roorda B Desma Maximy that this patient is under my care and that I, or a nurse practitioner or physician's assistant working with me, had a face-to-face encounter that meets the physician face-to-face encounter requirements with this patient on 02/10/2020. The encounter with the patient was in whole, or in part for the following medical condition(s) which is the primary reason for home health care (List medical condition): bacteremia, has picc line   The encounter with the patient was in whole, or in part, for the following medical condition, which is the primary reason for home health care: bacteremia   I certify that, based on my findings, the following services are medically necessary home health services: Nursing   Reason for Medically Necessary Home Health Services: Skilled Nursing- Assessment and Training for Infusion Therapy, Line Care, and Infection Control   My clinical findings support the need for the above services: OTHER SEE COMMENTS   Further, I certify that my clinical findings support that this patient is homebound due to: Mental confusion   Flush IV access with Sodium Chloride 0.9% and Heparin 10 units/ml or 100 units/ml   Complete by: As directed    [facility] [place]  Complete by: As directed    To provide the following care/treatments: RN   Home infusion instructions - [facility]   Complete by: As directed    Instructions: Flush IV access with Sodium Chloride 0.9% and Heparin 10units/ml or 100units/ml   Change dressing on IV access line: Weekly and PRN   Instructions Cath Flo 80m: Administer for PICC Line occlusion and as ordered by physician for other access  device   [facility] pharmacist to adjust dose for: Vancomycin, Aminoglycosides and other anti-infective therapies as requested by physician   Increase activity slowly   Complete by: As directed    Method of administration may be changed at the discretion of home infusion pharmacist based upon assessment of the patient and/or caregiver's ability to self-administer the medication ordered   Complete by: As directed      Allergies as of 02/10/2020      Reactions   Sulfamethoxazole Swelling   Crestor [rosuvastatin Calcium]    Causes muscle cramps   Rosuvastatin Other (See Comments)   Other reaction(s): Cramps (ALLERGY/intolerance) Causes muscle cramps Causes muscle cramps   Rosuvastatin Calcium Other (See Comments)   Causes muscle cramps      Medication List    TAKE these medications   alfuzosin 10 MG 24 hr tablet Commonly known as: UROXATRAL Take 10 mg by mouth daily with breakfast.   amLODipine 10 MG tablet Commonly known as: NORVASC Take 10 mg by mouth daily. In the evening   aspirin 81 MG tablet Take 81 mg by mouth daily.   CINNAMON PO Take by mouth.   ertapenem  IVPB Commonly known as: INVANZ Inject 1 g into the vein daily for 11 days. Indication:  ESBL E.coli bacteremia with complicated UTI First Dose: Yes Last Day of Therapy:  02/22/2020 Labs - Once weekly:  CBC/D and CMP Method of administration: Mini-Bag Plus / Gravity Method of administration may be changed at the discretion of home infusion pharmacist based upon assessment of the patient and/or caregiver's ability to self-administer the medication ordered. Start taking on: February 11, 2020   Fish Oil 1200 MG Caps Take 1,200 mg by mouth daily.   multivitamin with minerals tablet Take 1 tablet by mouth daily.   naproxen sodium 220 MG tablet Commonly known as: ALEVE Take 220 mg by mouth 2 (two) times daily with a meal.   valsartan-hydrochlorothiazide 160-25 MG tablet Commonly known as:  DIOVAN-HCT Take 1 tablet by mouth daily.            Discharge Care Instructions  (From admission, onward)         Start     Ordered   02/10/20 0000  Change dressing on IV access line weekly and PRN  (Home infusion instructions - [facility] )        02/10/20 1314         Allergies  Allergen Reactions  . Sulfamethoxazole Swelling  . Crestor [Rosuvastatin Calcium]     Causes muscle cramps  . Rosuvastatin Other (See Comments)    Other reaction(s): Cramps (ALLERGY/intolerance) Causes muscle cramps Causes muscle cramps   . Rosuvastatin Calcium Other (See Comments)    Causes muscle cramps      The results of significant diagnostics from this hospitalization (including imaging, microbiology, ancillary and laboratory) are listed below for reference.    Significant Diagnostic Studies: DG Chest 2 View  Result Date: 02/06/2020 CLINICAL DATA:  Fever and nausea EXAM: CHEST - 2 VIEW COMPARISON:  December 22, 2019 FINDINGS: There is a focal area of airspace opacity in the anterior left base. Lungs elsewhere are clear. Heart size and pulmonary vascularity normal. No adenopathy. There is aortic atherosclerosis. No bone lesions. IMPRESSION: Focal airspace opacity concerning for pneumonia anterior left base. Lungs elsewhere clear. Cardiac silhouette normal. No adenopathy. Aortic Atherosclerosis (ICD10-I70.0). Followup PA and lateral chest radiographs recommended in 3-4 weeks following trial of antibiotic therapy to ensure resolution and exclude underlying malignancy. Electronically Signed   By: Lowella Grip III M.D.   On: 02/06/2020 09:02   CT Abdomen Pelvis W Contrast  Result Date: 02/06/2020 CLINICAL DATA:  Hematuria with nausea and fever EXAM: CT ABDOMEN AND PELVIS WITH CONTRAST TECHNIQUE: Multidetector CT imaging of the abdomen and pelvis was performed using the standard protocol following bolus administration of intravenous contrast. CONTRAST:  146m OMNIPAQUE  IOHEXOL 300 MG/ML  SOLN COMPARISON:  Jul 27, 2017 FINDINGS: Lower chest: There is mild scarring in the lung bases. There is no lung base edema or consolidation. There are foci of coronary artery calcification. There is a small hiatal hernia. Hepatobiliary: There is hepatic steatosis. There are scattered subcentimeter cysts in the liver. Gallbladder is absent. There is no appreciable biliary duct dilatation given post cholecystectomy state. No biliary duct mass or calculus evident. Pancreas: No pancreatic mass or inflammatory focus. Spleen: No splenic lesions are evident. Adrenals/Urinary Tract: Adrenals bilaterally appear normal. There is no well-defined renal mass. Note that there are areas of decreased enhancement in portions of the mid and posterior aspects of the kidney involving portions of each upper and lower pole region on the left without associated air. There is associated perinephric stranding on the left. The right kidney shows no changes of this nature. There is no hydronephrosis on either side. There is no evident renal or ureteral calculus on either side. Urinary bladder is midline with wall thickness within normal limits. Stomach/Bowel: There is no appreciable bowel wall or mesenteric thickening. There are multiple descending colon and sigmoid diverticula without evident diverticulitis. Terminal ileum appears normal. There is no demonstrable bowel obstruction. There is no free air or portal venous air. Vascular/Lymphatic: There is no abdominal aortic aneurysm. There is aortic and common iliac artery atherosclerotic calcification. Major venous structures appear patent. There is no appreciable adenopathy in the abdomen or pelvis. Reproductive: Occasional prostatic calculi noted. Prostate and seminal vesicles normal in size and contour. Other: The appendix appears normal. There is no evident abscess apart from inflammatory change involving the left kidney or ascites in the abdomen or pelvis. Fat noted in  each inguinal ring. Musculoskeletal: There are foci of degenerative change in the lumbar spine. Focal spinal stenosis is noted at L4-5 due to bony hypertrophy and disc protrusion. No blastic or lytic bone lesions are evident. No intramuscular lesions are appreciable. IMPRESSION: 1. Areas of inhomogeneous enhancement in the left kidney as well as perinephric stranding. No well-defined fluid in the perinephric fascia on the left. This appearance is felt to be indicative of bacterial nephritis/acute lobar nephronia. Appropriate laboratory assessment in this regard advised. 2. No hydronephrosis on either side. No evident renal or ureteral calculus on either side. Urinary bladder wall thickness normal. 3. Multiple descending colonic and sigmoid diverticula without diverticulitis. No bowel wall thickening or bowel obstruction. No abscess in the abdomen or pelvis. Appendix appears normal. 4.  Hepatic steatosis. 5.  Gallbladder absent. 6. Aortic Atherosclerosis (ICD10-I70.0). Foci of coronary artery calcification noted. 7.  Small hiatal hernia. 8. Degree of spinal stenosis at L4-5  due to bony hypertrophy and disc protrusion. Electronically Signed   By: Lowella Grip III M.D.   On: 02/06/2020 10:11   US RENAL  Result Date: 02/09/2020 CLINICAL DATA:  Follow-up abnormal CT.  Left-sided pyelonephritis. EXAM: RENAL / URINARY TRACT ULTRASOUND COMPLETE COMPARISON:  CT of the abdomen pelvis 02/26/2020 FINDINGS: Right Kidney: Renal measurements: 11.1 x 5.7 x 5.3 cm = volume: 176 mL. Echogenicity within normal limits. No mass or hydronephrosis visualized. Left Kidney: Renal measurements: 12.7 x 6.7 x 5.9 cm = volume: 263 mL mL. Focal hypoechoic region measures 2.9 x 2.3 x 3.3 cm in the interpolar region of the left kidney. Trace amount fluid is present adjacent to the kidney at this same level. This corresponds with the area hypoperfusion the CT scan. No abscess is present. Bladder: Appears normal for degree of bladder  distention. Other: None. IMPRESSION: 1. 3.3 x 2.3 x 3.3 cm hypoechoic region in the interpolar region of the left kidney. This corresponds to the area of suspected pyelonephritis on the prior exam. No abscess is present. 2. Normal sonographic appearance of the right kidney and bladder. Electronically Signed   By: San Morelle M.D.   On: 02/09/2020 15:32   [place] EKG SITE RITE  Result Date: 02/09/2020 If Site Rite image not attached, placement could not be confirmed due to current cardiac rhythm.   Microbiology: Recent Results (from the past 240 hour(s))  Culture, blood (Routine x 2)     Status: Abnormal   Collection Time: 02/06/20  8:21 AM   Specimen: BLOOD  Result Value Ref Range Status   Specimen Description   Final    BLOOD RIGHT ANTECUBITAL Performed at [facility], [address], [place], [place] [address]    Special Requests   Final    BOTTLES DRAWN AEROBIC AND ANAEROBIC Blood Culture adequate volume Performed at [facility], [address] [place], [place] [address]    Culture  Setup Time   Final    Organism ID to follow [place] CRITICAL RESULT CALLED TO, READ BACK BY AND VERIFIED WITH: SCOTT HALL AT 0102 ON 02/07/20 SNG Performed at [facility], [address] [place], [place] [address]    Culture ESCHERICHIA COLI (A)  Final   Report Status 02/09/2020 FINAL  Final   Organism ID, Bacteria ESCHERICHIA COLI  Final      Susceptibility   Escherichia coli - MIC*    AMPICILLIN >=32 RESISTANT Resistant     CEFAZOLIN >=64 RESISTANT Resistant     CEFEPIME 16 RESISTANT Resistant     CEFTAZIDIME RESISTANT Resistant     CEFTRIAXONE >=64 RESISTANT Resistant     CIPROFLOXACIN >=4 RESISTANT Resistant     GENTAMICIN <=1 SENSITIVE Sensitive     IMIPENEM <=0.25 SENSITIVE Sensitive     TRIMETH/SULFA >=320 RESISTANT Resistant     AMPICILLIN/SULBACTAM 4 SENSITIVE Sensitive     PIP/TAZO <=4 SENSITIVE Sensitive     *  ESCHERICHIA COLI  Blood Culture ID Panel (Reflexed)     Status: Abnormal   Collection Time: 02/06/20  8:21 AM  Result Value Ref Range Status   Enterococcus faecalis NOT DETECTED NOT DETECTED Final   Enterococcus Faecium NOT DETECTED NOT DETECTED Final   Listeria monocytogenes NOT DETECTED NOT DETECTED Final   Staphylococcus species NOT DETECTED NOT DETECTED Final   Staphylococcus aureus (BCID) NOT DETECTED NOT DETECTED Final   Staphylococcus epidermidis NOT DETECTED NOT DETECTED Final   Staphylococcus lugdunensis NOT DETECTED  NOT DETECTED Final   Streptococcus species NOT DETECTED NOT DETECTED Final   Streptococcus agalactiae NOT DETECTED NOT DETECTED Final   Streptococcus pneumoniae NOT DETECTED NOT DETECTED Final   Streptococcus pyogenes NOT DETECTED NOT DETECTED Final   A.calcoaceticus-baumannii NOT DETECTED NOT DETECTED Final   Bacteroides fragilis NOT DETECTED NOT DETECTED Final   Enterobacterales DETECTED (A) NOT DETECTED Final    Comment: Enterobacterales represent a large order of gram negative bacteria, not a single organism. CRITICAL RESULT CALLED TO, READ BACK BY AND VERIFIED WITH: SCOTT HALL AT 0102 ON 02/07/20 SNG    Enterobacter cloacae complex NOT DETECTED NOT DETECTED Final   Escherichia coli DETECTED (A) NOT DETECTED Final    Comment: CRITICAL RESULT CALLED TO, READ BACK BY AND VERIFIED WITH: SCOTT HALL AT 0104 ON 02/07/20 SNG    Klebsiella aerogenes NOT DETECTED NOT DETECTED Final   Klebsiella oxytoca NOT DETECTED NOT DETECTED Final   Klebsiella pneumoniae NOT DETECTED NOT DETECTED Final   Proteus species NOT DETECTED NOT DETECTED Final   Salmonella species NOT DETECTED NOT DETECTED Final   Serratia marcescens NOT DETECTED NOT DETECTED Final   Haemophilus influenzae NOT DETECTED NOT DETECTED Final   Neisseria meningitidis NOT DETECTED NOT DETECTED Final   Pseudomonas aeruginosa NOT DETECTED NOT DETECTED Final   Stenotrophomonas maltophilia NOT DETECTED NOT  DETECTED Final   Candida albicans NOT DETECTED NOT DETECTED Final   Candida auris NOT DETECTED NOT DETECTED Final   Candida glabrata NOT DETECTED NOT DETECTED Final   Candida krusei NOT DETECTED NOT DETECTED Final   Candida parapsilosis NOT DETECTED NOT DETECTED Final   Candida tropicalis NOT DETECTED NOT DETECTED Final   Cryptococcus neoformans/gattii NOT DETECTED NOT DETECTED Final   CTX-M ESBL DETECTED (A) NOT DETECTED Final    Comment: CRITICAL RESULT CALLED TO, READ BACK BY AND VERIFIED WITH: SCOTT HALL AT 0104 ON 02/07/20 SNG (NOTE) Extended spectrum beta-lactamase detected. Recommend a carbapenem as initial therapy.      Carbapenem resistance IMP NOT DETECTED NOT DETECTED Final   Carbapenem resistance KPC NOT DETECTED NOT DETECTED Final   Carbapenem resistance NDM NOT DETECTED NOT DETECTED Final   Carbapenem resist OXA 48 LIKE NOT DETECTED NOT DETECTED Final   Carbapenem resistance VIM NOT DETECTED NOT DETECTED Final    Comment: Performed at [facility], [address], [place], [place] [address]  Urine Culture     Status: Abnormal   Collection Time: 02/06/20  8:24 AM   Specimen: Urine, Random  Result Value Ref Range Status   Specimen Description   Final    URINE, RANDOM Performed at [facility], [address] [place], [place] [address]    Special Requests   Final    NONE Performed at [facility], [address] [place], [place] [address]    Culture (A)  Final    >=100,000 COLONIES/mL ESCHERICHIA COLI Confirmed Extended Spectrum Beta-Lactamase Producer (ESBL).  In bloodstream infections from ESBL organisms, carbapenems are preferred over piperacillin/tazobactam. They are shown to have a lower risk of mortality.    Report Status 02/09/2020 FINAL  Final   Organism ID, Bacteria ESCHERICHIA COLI (A)  Final      Susceptibility   Escherichia coli - MIC*    AMPICILLIN >=32 RESISTANT Resistant     CEFAZOLIN >=64 RESISTANT Resistant      CEFEPIME 2 SENSITIVE Sensitive     CEFTRIAXONE >=64 RESISTANT Resistant     CIPROFLOXACIN >=4 RESISTANT Resistant     GENTAMICIN <=  1 SENSITIVE Sensitive     IMIPENEM <=0.25 SENSITIVE Sensitive     NITROFURANTOIN <=16 SENSITIVE Sensitive     TRIMETH/SULFA >=320 RESISTANT Resistant     AMPICILLIN/SULBACTAM 4 SENSITIVE Sensitive     PIP/TAZO <=4 SENSITIVE Sensitive     * >=100,000 COLONIES/mL ESCHERICHIA COLI  Culture, blood (Routine x 2)     Status: None (Preliminary result)   Collection Time: 02/06/20  9:29 AM   Specimen: BLOOD  Result Value Ref Range Status   Specimen Description BLOOD BLOOD LEFT HAND  Final   Special Requests   Final    BOTTLES DRAWN AEROBIC AND ANAEROBIC Blood Culture adequate volume   Culture   Final    NO GROWTH 4 DAYS Performed at [facility], [address], [place], [place] [address]    Report Status PENDING  Incomplete  Respiratory Panel by RT PCR (Flu A&B, Covid) - Nasopharyngeal Swab     Status: None   Collection Time: 02/06/20  9:29 AM   Specimen: Nasopharyngeal Swab  Result Value Ref Range Status   SARS Coronavirus 2 by RT PCR NEGATIVE NEGATIVE Final    Comment: (NOTE) SARS-CoV-2 target nucleic acids are NOT DETECTED.  The SARS-CoV-2 RNA is generally detectable in upper respiratoy specimens during the acute phase of infection. The lowest concentration of SARS-CoV-2 viral copies this assay can detect is 131 copies/mL. A negative result does not preclude SARS-Cov-2 infection and should not be used as the sole basis for treatment or other patient management decisions. A negative result may occur with  improper specimen collection/handling, submission of specimen other than nasopharyngeal swab, presence of viral mutation(s) within the areas targeted by this assay, and inadequate number of viral copies (<131 copies/mL). A negative result must be combined with clinical observations, patient history, and epidemiological information.  The expected result is Negative.  Fact Sheet for Patients:  PinkCheek.be  Fact Sheet for Healthcare Providers:  GravelBags.it  This test is no t yet approved or cleared by the [place] FDA and  has been authorized for detection and/or diagnosis of SARS-CoV-2 by FDA under an Emergency Use Authorization (EUA). This EUA will remain  in effect (meaning this test can be used) for the duration of the COVID-19 declaration under Section 564(b)(1) of the Act, 21 U.S.C. section 360bbb-3(b)(1), unless the authorization is terminated or revoked sooner.     Influenza A by PCR NEGATIVE NEGATIVE Final   Influenza B by PCR NEGATIVE NEGATIVE Final    Comment: (NOTE) The Xpert Xpress SARS-CoV-2/FLU/RSV assay is intended as an aid in  the diagnosis of influenza from Nasopharyngeal swab specimens and  should not be used as a sole basis for treatment. Nasal washings and  aspirates are unacceptable for Xpert Xpress SARS-CoV-2/FLU/RSV  testing.  Fact Sheet for Patients: PinkCheek.be  Fact Sheet for Healthcare Providers: GravelBags.it  This test is not yet approved or cleared by the [place] FDA and  has been authorized for detection and/or diagnosis of SARS-CoV-2 by  FDA under an Emergency Use Authorization (EUA). This EUA will remain  in effect (meaning this test can be used) for the duration of the  Covid-19 declaration under Section 564(b)(1) of the Act, 21  U.S.C. section 360bbb-3(b)(1), unless the authorization is  terminated or revoked. Performed at [facility], [address] [place], [place] [address]   CULTURE, BLOOD (ROUTINE X 2) w Reflex to ID Panel     Status: None (Preliminary result)   Collection Time: 02/08/20  2:30  PM   Specimen: BLOOD  Result Value Ref Range Status   Specimen Description BLOOD LAC  Final   Special Requests   Final     BOTTLES DRAWN AEROBIC AND ANAEROBIC Blood Culture adequate volume   Culture   Final    NO GROWTH 2 DAYS Performed at [facility], [address] [place], [place] [address]    Report Status PENDING  Incomplete  CULTURE, BLOOD (ROUTINE X 2) w Reflex to ID Panel     Status: None (Preliminary result)   Collection Time: 02/08/20  2:34 PM   Specimen: BLOOD  Result Value Ref Range Status   Specimen Description BLOOD RIGHT HAND  Final   Special Requests   Final    BOTTLES DRAWN AEROBIC AND ANAEROBIC Blood Culture adequate volume   Culture   Final    NO GROWTH 2 DAYS Performed at [facility], [address] [place], [place] [address]    Report Status PENDING  Incomplete     Labs: Basic Metabolic Panel: Recent Labs  Lab 02/06/20 0821 02/07/20 0713 02/08/20 0503 02/09/20 0347 02/10/20 0406  NA 133* 137 134* 137 138  K 3.8 3.4* 3.3* 3.6 3.5  CL 95* 100 100 102 102  CO2 _0 GLUCOSE 204* 139* 116* 124* 117*  BUN _1 CREATININE 1.02 0.85 0.72 0.78 0.84  CALCIUM 8.6* 8.0* 7.6* 8.3* 7.9*  MG  --   --  2.1  --   --    Liver Function Tests: Recent Labs  Lab 02/06/20 0821  AST 22  ALT 23  ALKPHOS 52  BILITOT 0.8  PROT 7.6  ALBUMIN 3.8   No results for input(s): LIPASE, AMYLASE in the last 168 hours. No results for input(s): AMMONIA in the last 168 hours. CBC: Recent Labs  Lab 02/06/20 0821 02/07/20 0713 02/08/20 0503 02/09/20 0347 02/10/20 0406  WBC 19.0* 11.8* 8.4 8.0 7.6  NEUTROABS 16.5*  --   --   --   --   HGB 15.9 14.3 13.0 13.9 14.3  HCT 46.5 43.0 38.2* 41.0 42.5  MCV 86.8 89.0 86.8 86.3 86.6  PLT 193 172 167 212 263   Cardiac Enzymes: No results for input(s): CKTOTAL, CKMB, CKMBINDEX, TROPONINI in the last 168 hours. BNP: BNP (last 3 results) No results for input(s): BNP in the last 8760 hours.  ProBNP (last 3 results) No results for input(s): PROBNP in the last 8760 hours.  CBG: No results for input(s):  GLUCAP in the last 168 hours.     Signed:  Desma Maxim MD.  [facility] 02/10/2020, 1:14 PM

## 2020-02-10 NOTE — Consult Note (Signed)
PHARMACY CONSULT NOTE FOR:  OUTPATIENT  PARENTERAL ANTIBIOTIC THERAPY (OPAT)  Indication: ESBL E.coli bacteremia with complicated UTI Regimen: Ertapenem 1g IV q24h End date: 02/22/20  IV antibiotic discharge orders are pended. To discharging provider:  please sign these orders via discharge navigator,  Select New Orders & click on the button choice - Manage This Unsigned Work.     Thank you for allowing pharmacy to be a part of this patient's care.  Sherilyn Banker, PharmD Pharmacy Resident  02/10/2020 8:54 AM

## 2020-02-10 NOTE — Discharge Instructions (Signed)
Pyelonephritis, Adult  Pyelonephritis is an infection that occurs in the kidney. The kidneys are organs that help clean the blood by moving waste out of the blood and into the pee (urine). This infection can happen quickly, or it can last for a long time. In most cases, it clears up with treatment and does not cause other problems. What are the causes? This condition may be caused by:  Germs (bacteria) going from the bladder up to the kidney. This may happen after having a bladder infection.  Germs going from the blood to the kidney. What increases the risk? This condition is more likely to develop in:  Pregnant women.  Older people.  People who have any of these conditions: ? Diabetes. ? Inflammation of the prostate gland (prostatitis), in males. ? Kidney stones or bladder stones. ? Other problems with the kidney or the parts of your body that carry pee from the kidneys to the bladder (ureters). ? Cancer.  People who have a small, thin tube (catheter) placed in the bladder.  People who are sexually active.  Women who use a medicine that kills sperm (spermicide) to prevent pregnancy.  People who have had a prior urinary tract infection (UTI). What are the signs or symptoms? Symptoms of this condition include:  Peeing often.  A strong urge to pee right away.  Burning or stinging when peeing.  Belly pain.  Back pain.  Pain in the side (flank area).  Fever or chills.  Blood in the pee, or dark pee.  Feeling sick to your stomach (nauseous) or throwing up (vomiting). How is this treated? This condition may be treated by:  Taking antibiotic medicines by mouth (orally).  Drinking enough fluids. If the infection is bad, you may need to stay in the hospital. You may be given antibiotics and fluids that are put directly into a vein through an IV tube. In some cases, other treatments may be needed. Follow these instructions at home: Medicines  Take your antibiotic  medicine as told by your doctor. Do not stop taking the antibiotic even if you start to feel better.  Take over-the-counter and prescription medicines only as told by your doctor. General instructions   Drink enough fluid to keep your pee pale yellow.  Avoid caffeine, tea, and carbonated drinks.  Pee (urinate) often. Avoid holding in pee for long periods of time.  Pee before and after sex.  After pooping (having a bowel movement), women should wipe from front to back. Use each tissue only once.  Keep all follow-up visits as told by your doctor. This is important. Contact a doctor if:  You do not feel better after 2 days.  Your symptoms get worse.  You have a fever. Get help right away if:  You cannot take your medicine or drink fluids as told.  You have chills and shaking.  You throw up.  You have very bad pain in your side or back.  You feel very weak or you pass out (faint). Summary  Pyelonephritis is an infection that occurs in the kidney.  In most cases, this infection clears up with treatment and does not cause other problems.  Take your antibiotic medicine as told by your doctor. Do not stop taking the antibiotic even if you start to feel better.  Drink enough fluid to keep your pee pale yellow. This information is not intended to replace advice given to you by your health care provider. Make sure you discuss any questions you have with  your health care provider. Document Revised: 01/15/2018 Document Reviewed: 01/15/2018 Elsevier Patient Education  [place].   Bacteremia, Adult Bacteremia is the presence of bacteria in the blood. When bacteria enter the bloodstream, they can cause a life-threatening reaction called sepsis. Sepsis is a medical emergency. What are the causes? This condition is caused by bacteria that get into the blood. Bacteria can enter the blood from an infection, including:  A skin infection or injury, such as a burn or a  cut.  A lung infection (pneumonia).  An infection in the stomach or intestines.  An infection in the bladder or urinary system (urinary tract infection).  A bacterial infection in another part of the body that spreads to the blood. Bacteria can also enter the blood during a dental or medical procedure, from bleeding gums, or through use of an unclean needle. What increases the risk? This condition is more likely to develop in children, older adults, and people who have:  A long-term (chronic) disease or condition like diabetes or chronic kidney failure.  An artificial joint or heart valve, or heart valve disease.  A tube inserted to treat a medical condition, such as a urinary catheter or IV.  A weak disease-fighting system (immune system).  Injected illegal drugs.  Been hospitalized for more than 10 days in a row. What are the signs or symptoms? Symptoms of this condition include:  Fever and chills.  Fast heartbeat and shortness of breath.  Dizziness, weakness, and low blood pressure.  Confusion or anxiety.  Pain in the abdomen, nausea, vomiting, and diarrhea. Bacteremia that has spread to other parts of the body may cause symptoms in those areas. In some cases, there are no symptoms. How is this diagnosed? This condition may be diagnosed with a physical exam and tests, such as:  Blood tests to check for bacteria (cultures) or other signs of infection.  Tests of any tubes that you have had inserted. These tests check for a source of infection.  Urine tests to check for bacteria in the urine.  Imaging tests, such as an X-ray, a CT scan, an MRI, or a heart ultrasound. These check for a source of infection in other parts of your body, such as your lungs, heart valves, or joints. How is this treated? This condition is usually treated in the hospital. If you are treated at home, you may need to return to the hospital for medicines, blood tests, and evaluation. Treatment  may include:  Antibiotic medicines. These may be given by mouth or directly into your blood through an IV. You may need antibiotics for several weeks. At first, you may be given an antibiotic to kill most types of blood bacteria. If tests show that a certain kind of bacteria is causing the problem, you may be given a different antibiotic.  IV fluids.  Removing any catheter or device that could be a source of infection.  Blood pressure and breathing support, if needed.  Surgery to control the source or the spread of infection, such as surgery to remove an implanted device, abscess, or infected tissue. Follow these instructions at home: Medicines  Take over-the-counter and prescription medicines only as told by your health care provider.  If you were prescribed an antibiotic medicine, take it as told by your health care provider. Do not stop taking the antibiotic even if you start to feel better. General instructions   Rest as needed. Ask your health care provider when you may return to normal activities.  Drink enough fluid to keep your urine pale yellow.  Do not use any products that contain nicotine or tobacco, such as cigarettes, e-cigarettes, and chewing tobacco. If you need help quitting, ask your health care provider.  Keep all follow-up visits as told by your health care provider. This is important. How is this prevented?   Wash your hands regularly with soap and water. If soap and water are not available, use hand sanitizer.  You should wash your hands: ? After using the toilet or changing a diaper. ? Before preparing, cooking, serving, or eating food. ? While caring for a sick person or while visiting someone in a hospital. ? Before and after changing bandages (dressings) over wounds.  Clean any scrapes or cuts with soap and water and cover them with a clean bandage.  Get vaccinations as recommended by your health care provider.  Practice good oral hygiene. Brush  your teeth two times a day, and floss regularly.  Take good care of your skin. This includes bathing and moisturizing on a regular basis. Contact a health care provider if:  Your symptoms get worse, and medicines do not help.  You have severe pain. Get help right away if you have:  Pain.  A fever or chills.  Trouble breathing.  A fast heart rate.  Skin that is blotchy, pale, or clammy.  Confusion.  Weakness.  Lack of energy or unusual sleepiness.  New symptoms that develop after treatment has started. These symptoms may represent a serious problem that is an emergency. Do not wait to see if the symptoms will go away. Get medical help right away. Call your local emergency services (911 in the [place] Do not drive yourself to the hospital. Summary  Bacteremia is the presence of bacteria in the blood. When bacteria enter the bloodstream, they can cause a life-threatening reaction called sepsis.  Bacteremia is usually treated with antibiotic medicines in the hospital.  If you were prescribed an antibiotic medicine, take it as told by your health care provider. Do not stop taking the antibiotic even if you start to feel better.  Get help right away if you have any new symptoms that develop after treatment has started. This information is not intended to replace advice given to you by your health care provider. Make sure you discuss any questions you have with your health care provider. Document Revised: 08/02/2018 Document Reviewed: 08/02/2018 Elsevier Patient Education  [place].

## 2020-02-10 NOTE — TOC Initial Note (Signed)
Transition of Care [facility]) - Initial/Assessment Note    Patient Details  Name: Martin Hanna MRN: [id number] Date of Birth: 11-06-1955  Transition of Care [facility]) CM/SW Contact:    Eileen Stanford, LCSW Phone Number: 02/10/2020, 9:38 AM  Clinical Narrative: Pt is alert and oriented. Pt lives at home with spouse. Pt will require home IV antibiotics. [facility] will service pt and has completed teaching with pt and pt's spouse on 11/15. Pt will also get a HHRN from [facility]. Pt's spouse states there are no additional needs at this time.                  Expected Discharge Plan: [place] Barriers to Discharge: Continued Medical Work up   Patient Goals and CMS Choice Patient states their goals for this hospitalization and ongoing recovery are:: to get better   Choice offered to / list presented to : Patient  Expected Discharge Plan and Services Expected Discharge Plan: [place] In-house Referral: Clinical Social Work   Post Acute Care Choice: [place] arrangements for the past 2 months: [place]: RN [place] Agency: [place] Date [place]: 02/09/20 Time [place]: [phone] Representative spoke with at [place]: [place] with [facility] contacted [place]  Prior Living Arrangements/Services Living arrangements for the past 2 months: [place] with:: Spouse Patient language and need for interpreter reviewed:: Yes Do you feel safe going back to the place where you live?: Yes      Need for Family Participation in Patient Care: Yes (Comment) Care giver support system in place?: Yes (comment)   Criminal Activity/Legal Involvement Pertinent to Current Situation/Hospitalization: No - Comment as needed  Activities of Daily Living Home Assistive Devices/Equipment: None ADL Screening (condition at time of admission) Patient's  cognitive ability adequate to safely complete daily activities?: Yes Is the patient deaf or have difficulty hearing?: No Does the patient have difficulty seeing, even when wearing glasses/contacts?: No Does the patient have difficulty concentrating, remembering, or making decisions?: No Patient able to express need for assistance with ADLs?: Yes Does the patient have difficulty dressing or bathing?: No Independently performs ADLs?: Yes (appropriate for developmental age) Does the patient have difficulty walking or climbing stairs?: No Weakness of Legs: None Weakness of Arms/Hands: None  Permission Sought/Granted Permission sought to share information with : Family Supports    Share Information with NAME: Margaretha Sheffield  Permission granted to share info w AGENCY: [facility]  Permission granted to share info w Relationship: spouse     Emotional Assessment Appearance:: Appears stated age Attitude/Demeanor/Rapport: Engaged Affect (typically observed): Accepting, Appropriate, Calm Orientation: : Oriented to Self, Oriented to Place, Oriented to  Time, Oriented to Situation Alcohol / Substance Use: Not Applicable Psych Involvement: No (comment)  Admission diagnosis:  Pyelonephritis [N12] Sepsis ([place]) [A41.9] Fever, unspecified fever cause [R50.9] Sepsis, due to unspecified organism, unspecified whether acute organ dysfunction present [facility]) [A41.9] Patient Active Problem List   Diagnosis Date Noted  . Sepsis ([place]) 02/06/2020  . Hypercholesteremia   . Bacterial nephritis   . Seizures ([place]) 02/04/2020  . UTI due to extended-spectrum beta lactamase (ESBL) producing Escherichia coli 02/04/2020  . Refusal of statin medication by patient 04/18/2019  . Nocturia more than twice per night  03/25/2019  . Benign paroxysmal positional vertigo 02/11/2019  . Chronic chest wall pain 01/02/2018  . Labile hypertension 09/03/2017  . Benign prostatic hyperplasia with urinary frequency 08/31/2017  .  Intertrigo 08/31/2017  . Closed fracture of manubrium with routine healing 08/10/2017  . Closed fracture of multiple ribs of left side with routine healing 08/02/2017  . Fracture of manubrium 08/02/2017  . Rib fracture 08/02/2017  . Abnormal glucose 11/24/2016  . BMI 37.0-37.9, adult 07/17/2016  . Essential hypertension 07/17/2016  . Sensory neuropathy 07/17/2016   PCP:  Cyndi Bender, PA-C Pharmacy:   [id number] - [place], [address] [place] [address] Phone: [phone] Fax: [phone]     Social Determinants of Health (SDOH) Interventions    Readmission Risk Interventions No flowsheet data found.

## 2020-02-10 NOTE — Treatment Plan (Signed)
Diagnosis: ESBL e.coli bactermia with complicated UTI Baseline Creatinine < 1   Allergies  Allergen Reactions  . Sulfamethoxazole Swelling  . Crestor [Rosuvastatin Calcium]     Causes muscle cramps  . Rosuvastatin Other (See Comments)    Other reaction(s): Cramps (ALLERGY/intolerance) Causes muscle cramps Causes muscle cramps   . Rosuvastatin Calcium Other (See Comments)    Causes muscle cramps    OPAT Orders Discharge antibiotics: Ertapenem 1 gram IV every 24 hrs until 02/22/20  [facility] Care Per Protocol:  Labs weekly while on IV antibiotics: _X_ CBC with differential  _X_ CMP  X__ Please leave PIC at completion of IV antibiotics and notify Dr.Elcie Pelster at [phone]   Fax weekly labs to [phone]  Clinic Follow Up Appt:02/24/20 at 11am    Call [phone] with any questions

## 2020-02-11 DIAGNOSIS — Z452 Encounter for adjustment and management of vascular access device: Secondary | ICD-10-CM | POA: Diagnosis not present

## 2020-02-11 DIAGNOSIS — Z792 Long term (current) use of antibiotics: Secondary | ICD-10-CM | POA: Diagnosis not present

## 2020-02-11 DIAGNOSIS — Z6836 Body mass index (BMI) 36.0-36.9, adult: Secondary | ICD-10-CM | POA: Diagnosis not present

## 2020-02-11 DIAGNOSIS — E785 Hyperlipidemia, unspecified: Secondary | ICD-10-CM | POA: Diagnosis not present

## 2020-02-11 DIAGNOSIS — R7881 Bacteremia: Secondary | ICD-10-CM | POA: Diagnosis not present

## 2020-02-11 DIAGNOSIS — I10 Essential (primary) hypertension: Secondary | ICD-10-CM | POA: Diagnosis not present

## 2020-02-11 DIAGNOSIS — A419 Sepsis, unspecified organism: Secondary | ICD-10-CM | POA: Diagnosis not present

## 2020-02-11 DIAGNOSIS — Z87440 Personal history of urinary (tract) infections: Secondary | ICD-10-CM | POA: Diagnosis not present

## 2020-02-11 DIAGNOSIS — N390 Urinary tract infection, site not specified: Secondary | ICD-10-CM | POA: Diagnosis not present

## 2020-02-11 DIAGNOSIS — A327 Listerial sepsis: Secondary | ICD-10-CM | POA: Diagnosis not present

## 2020-02-11 DIAGNOSIS — N400 Enlarged prostate without lower urinary tract symptoms: Secondary | ICD-10-CM | POA: Diagnosis not present

## 2020-02-11 DIAGNOSIS — G4733 Obstructive sleep apnea (adult) (pediatric): Secondary | ICD-10-CM | POA: Diagnosis not present

## 2020-02-11 DIAGNOSIS — N12 Tubulo-interstitial nephritis, not specified as acute or chronic: Secondary | ICD-10-CM | POA: Diagnosis not present

## 2020-02-11 DIAGNOSIS — A4151 Sepsis due to Escherichia coli [E. coli]: Secondary | ICD-10-CM | POA: Diagnosis not present

## 2020-02-11 DIAGNOSIS — E669 Obesity, unspecified: Secondary | ICD-10-CM | POA: Diagnosis not present

## 2020-02-11 LAB — CULTURE, BLOOD (ROUTINE X 2): Culture: NO GROWTH

## 2020-02-12 DIAGNOSIS — N10 Acute tubulo-interstitial nephritis: Secondary | ICD-10-CM | POA: Diagnosis not present

## 2020-02-12 DIAGNOSIS — N401 Enlarged prostate with lower urinary tract symptoms: Secondary | ICD-10-CM | POA: Diagnosis not present

## 2020-02-12 DIAGNOSIS — R351 Nocturia: Secondary | ICD-10-CM | POA: Diagnosis not present

## 2020-02-12 DIAGNOSIS — R350 Frequency of micturition: Secondary | ICD-10-CM | POA: Diagnosis not present

## 2020-02-12 DIAGNOSIS — R3915 Urgency of urination: Secondary | ICD-10-CM | POA: Diagnosis not present

## 2020-02-13 LAB — CULTURE, BLOOD (ROUTINE X 2)
Culture: NO GROWTH
Culture: NO GROWTH
Special Requests: ADEQUATE
Special Requests: ADEQUATE

## 2020-02-18 DIAGNOSIS — A419 Sepsis, unspecified organism: Secondary | ICD-10-CM | POA: Diagnosis not present

## 2020-02-18 DIAGNOSIS — N390 Urinary tract infection, site not specified: Secondary | ICD-10-CM | POA: Diagnosis not present

## 2020-02-18 DIAGNOSIS — A327 Listerial sepsis: Secondary | ICD-10-CM | POA: Diagnosis not present

## 2020-02-18 DIAGNOSIS — R7881 Bacteremia: Secondary | ICD-10-CM | POA: Diagnosis not present

## 2020-02-24 ENCOUNTER — Other Ambulatory Visit
Admission: RE | Admit: 2020-02-24 | Discharge: 2020-02-24 | Disposition: A | Discharge status: Home / Self Care | Payer: PPO | Source: Ambulatory Visit | Attending: Infectious Diseases | Admitting: Infectious Diseases

## 2020-02-24 ENCOUNTER — Encounter: Payer: Self-pay | Admitting: Infectious Diseases

## 2020-02-24 ENCOUNTER — Ambulatory Visit: Payer: PPO | Attending: Infectious Diseases | Admitting: Infectious Diseases

## 2020-02-24 VITALS — BP 120/81 | HR 92 | Temp 98.0°F | Resp 16 | Ht 65.0 in | Wt 221.0 lb

## 2020-02-24 DIAGNOSIS — B9620 Unspecified Escherichia coli [E. coli] as the cause of diseases classified elsewhere: Secondary | ICD-10-CM | POA: Insufficient documentation

## 2020-02-24 DIAGNOSIS — N401 Enlarged prostate with lower urinary tract symptoms: Secondary | ICD-10-CM | POA: Diagnosis not present

## 2020-02-24 DIAGNOSIS — N390 Urinary tract infection, site not specified: Secondary | ICD-10-CM | POA: Insufficient documentation

## 2020-02-24 NOTE — Patient Instructions (Signed)
You are ehre for follow up of UTI- you completed a total of 14 days of antibiotic for ESBL e.coli bacteremia and UTI- you are having an appt with urologist on Friday to discuss prostate symptoms and further management- Today we will do a urine culture and if negative can remove PICC line.
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
## 2020-02-25 LAB — URINE CULTURE: Culture: <10000 — AB

## 2020-02-27 DIAGNOSIS — N10 Acute tubulo-interstitial nephritis: Secondary | ICD-10-CM | POA: Diagnosis not present

## 2020-03-09 ENCOUNTER — Telehealth: Payer: Self-pay | Admitting: Infectious Diseases

## 2020-03-09 NOTE — Telephone Encounter (Signed)
Spoke to patient to confirm whether PICC line was removed after he completed IV antibiotic for ESBl bacteremia /UTI - HE said it was removed some time back- HE saw his urologist who has placed him on PO antibiotic( he did not remember the name) and he has 2 more days. He is feeling better and will follow up with Urologist in [place]

## 2020-03-16 DIAGNOSIS — Z23 Encounter for immunization: Secondary | ICD-10-CM | POA: Diagnosis not present

## 2020-03-16 DIAGNOSIS — I10 Essential (primary) hypertension: Secondary | ICD-10-CM | POA: Diagnosis not present

## 2020-03-16 DIAGNOSIS — R7303 Prediabetes: Secondary | ICD-10-CM | POA: Diagnosis not present

## 2020-03-16 DIAGNOSIS — H9202 Otalgia, left ear: Secondary | ICD-10-CM | POA: Diagnosis not present

## 2020-03-16 DIAGNOSIS — D72829 Elevated white blood cell count, unspecified: Secondary | ICD-10-CM | POA: Diagnosis not present

## 2020-03-16 DIAGNOSIS — Z1159 Encounter for screening for other viral diseases: Secondary | ICD-10-CM | POA: Diagnosis not present

## 2020-03-16 DIAGNOSIS — N390 Urinary tract infection, site not specified: Secondary | ICD-10-CM | POA: Diagnosis not present

## 2020-03-16 DIAGNOSIS — N400 Enlarged prostate without lower urinary tract symptoms: Secondary | ICD-10-CM | POA: Diagnosis not present

## 2020-03-16 DIAGNOSIS — I700 Atherosclerosis of aorta: Secondary | ICD-10-CM | POA: Diagnosis not present

## 2020-03-16 DIAGNOSIS — E7800 Pure hypercholesterolemia, unspecified: Secondary | ICD-10-CM | POA: Diagnosis not present

## 2020-03-16 DIAGNOSIS — Z125 Encounter for screening for malignant neoplasm of prostate: Secondary | ICD-10-CM | POA: Diagnosis not present

## 2020-04-16 DIAGNOSIS — N390 Urinary tract infection, site not specified: Secondary | ICD-10-CM | POA: Diagnosis not present

## 2020-04-16 DIAGNOSIS — N401 Enlarged prostate with lower urinary tract symptoms: Secondary | ICD-10-CM | POA: Diagnosis not present

## 2020-04-16 DIAGNOSIS — R350 Frequency of micturition: Secondary | ICD-10-CM | POA: Diagnosis not present

## 2020-04-16 DIAGNOSIS — N10 Acute tubulo-interstitial nephritis: Secondary | ICD-10-CM | POA: Diagnosis not present

## 2020-06-04 ENCOUNTER — Ambulatory Visit: Payer: PPO | Admitting: Sports Medicine

## 2020-06-07 ENCOUNTER — Ambulatory Visit: Payer: PPO | Admitting: Podiatry

## 2020-06-07 ENCOUNTER — Other Ambulatory Visit: Payer: Self-pay

## 2020-06-07 ENCOUNTER — Other Ambulatory Visit: Payer: Self-pay | Admitting: Podiatry

## 2020-06-07 ENCOUNTER — Ambulatory Visit (INDEPENDENT_AMBULATORY_CARE_PROVIDER_SITE_OTHER): Payer: PPO

## 2020-06-07 DIAGNOSIS — D2122 Benign neoplasm of connective and other soft tissue of left lower limb, including hip: Secondary | ICD-10-CM

## 2020-06-07 DIAGNOSIS — M722 Plantar fascial fibromatosis: Secondary | ICD-10-CM

## 2020-06-07 DIAGNOSIS — M2042 Other hammer toe(s) (acquired), left foot: Secondary | ICD-10-CM | POA: Diagnosis not present

## 2020-06-07 DIAGNOSIS — M79672 Pain in left foot: Secondary | ICD-10-CM

## 2020-06-07 DIAGNOSIS — M79671 Pain in right foot: Secondary | ICD-10-CM

## 2020-06-07 NOTE — Progress Notes (Signed)
  Subjective:  Patient ID: Martin Hanna, male    DOB: 09/05/1955,  MRN: [id number]  Chief Complaint  Patient presents with  . Cyst    Lt medial arch and 2nd sub met knots x couple mo; with burning and soreness; 6/10 - no injury no redness -w/ neuropathy Tx: none -wrose with wlaking     65 y.o. male presents with the above complaint. History confirmed with patient.   Objective:  Physical Exam: warm, good capillary refill, no trophic changes or ulcerative lesions, normal DP and PT pulses and normal sensory exam. Left Foot: large medial arch nodule; nodule under 2nd toe - firm, immobile. Dorsal dislocation 2nd toe    No images are attached to the encounter.  Radiographs: X-ray of the left foot: no fracture, dislocation, swelling or degenerative changes noted and elongated second metatarsal Assessment:   1. Fibroma of left foot   2. Plantar fasciitis   3. Hammertoe of left foot    Plan:  Patient was evaluated and treated and all questions answered.   Plantar fibroma, fibroma vs lipoma 2nd metatarsal area; hammertoe -XR reviewed with patient -Educated on etiology of deformity  -It does appear that the deformity is caused by the space occupying lesion plantarly. Will order [place] to eval. Likely will need removal, possible 2nd met osteotomy, 2nd/3rd hammertoe repair.  Return in about 1 month (around 07/08/2020) for U/S follow-up; Possible surgical planning.

## 2020-06-08 ENCOUNTER — Other Ambulatory Visit: Payer: Self-pay | Admitting: Podiatry

## 2020-06-08 DIAGNOSIS — D2122 Benign neoplasm of connective and other soft tissue of left lower limb, including hip: Secondary | ICD-10-CM

## 2020-06-08 DIAGNOSIS — M722 Plantar fascial fibromatosis: Secondary | ICD-10-CM

## 2020-06-08 NOTE — Progress Notes (Signed)
Faxed over to [facility] patient's [place] note, demographic, ins card, [place] order sheet and form

## 2020-06-11 DIAGNOSIS — Z79899 Other long term (current) drug therapy: Secondary | ICD-10-CM | POA: Diagnosis not present

## 2020-06-11 DIAGNOSIS — D2122 Benign neoplasm of connective and other soft tissue of left lower limb, including hip: Secondary | ICD-10-CM | POA: Diagnosis not present

## 2020-06-11 DIAGNOSIS — R2242 Localized swelling, mass and lump, left lower limb: Secondary | ICD-10-CM | POA: Diagnosis not present

## 2020-06-11 DIAGNOSIS — M722 Plantar fascial fibromatosis: Secondary | ICD-10-CM | POA: Diagnosis not present

## 2020-06-11 DIAGNOSIS — M2042 Other hammer toe(s) (acquired), left foot: Secondary | ICD-10-CM | POA: Diagnosis not present

## 2020-06-15 ENCOUNTER — Encounter: Payer: Self-pay | Admitting: Podiatry

## 2020-06-17 ENCOUNTER — Encounter: Payer: Self-pay | Admitting: Podiatry

## 2020-06-17 ENCOUNTER — Ambulatory Visit: Payer: PPO | Admitting: Podiatry

## 2020-06-17 ENCOUNTER — Other Ambulatory Visit: Payer: Self-pay

## 2020-06-17 DIAGNOSIS — D2122 Benign neoplasm of connective and other soft tissue of left lower limb, including hip: Secondary | ICD-10-CM

## 2020-06-17 DIAGNOSIS — M2042 Other hammer toe(s) (acquired), left foot: Secondary | ICD-10-CM

## 2020-06-17 DIAGNOSIS — M722 Plantar fascial fibromatosis: Secondary | ICD-10-CM | POA: Diagnosis not present

## 2020-07-13 DIAGNOSIS — N10 Acute tubulo-interstitial nephritis: Secondary | ICD-10-CM | POA: Diagnosis not present

## 2020-07-13 DIAGNOSIS — R350 Frequency of micturition: Secondary | ICD-10-CM | POA: Diagnosis not present

## 2020-07-13 DIAGNOSIS — R3915 Urgency of urination: Secondary | ICD-10-CM | POA: Diagnosis not present

## 2020-07-13 DIAGNOSIS — N401 Enlarged prostate with lower urinary tract symptoms: Secondary | ICD-10-CM | POA: Diagnosis not present

## 2020-07-19 NOTE — Progress Notes (Signed)
  Subjective:  Patient ID: Martin Hanna, male    DOB: [birth year],  MRN: [id number]  Chief Complaint  Patient presents with  . Foot Problem    I am here to get the results of the ultrasound and my legs are hurting    65 y.o. male presents with the above complaint. History confirmed with patient.   Objective:  Physical Exam: warm, good capillary refill, no trophic changes or ulcerative lesions, normal DP and PT pulses and normal sensory exam. Left Foot: large medial arch nodule; nodule under 2nd toe - firm, immobile. Dorsal dislocation 2nd toe   Assessment:   1. Fibroma of left foot   2. Plantar fasciitis   3. Hammertoe of left foot    Plan:  Patient was evaluated and treated and all questions answered.   Plantar fibroma, fibroma vs lipoma 2nd metatarsal area; hammertoe -[place] reviewed with patient large likely fibromas. -I think once the masses are removed his toes will sit down flat but if not he will need ligamentous repair of the plantar plate. -Patient has failed all conservative therapy and wishes to proceed with surgical intervention. All risks, benefits, and alternatives discussed with patient. No guarantees given. Consent reviewed and signed by patient. -Planned procedures: left foot excision of fibromas and arch; possible ligament repair of toe   No follow-ups on file.

## 2020-07-21 DIAGNOSIS — H04122 Dry eye syndrome of left lacrimal gland: Secondary | ICD-10-CM | POA: Diagnosis not present

## 2020-07-21 DIAGNOSIS — H35372 Puckering of macula, left eye: Secondary | ICD-10-CM | POA: Diagnosis not present

## 2020-07-21 DIAGNOSIS — H2513 Age-related nuclear cataract, bilateral: Secondary | ICD-10-CM | POA: Diagnosis not present

## 2020-07-22 ENCOUNTER — Telehealth: Payer: Self-pay | Admitting: Urology

## 2020-07-22 NOTE — Telephone Encounter (Signed)
DOS - 08/04/20  PLANTAR FIBROMA LT X 2 --- 28062 ANGULAR DEFORMITY CORRECTION LT --- 42353  HTA EFFECTIVE DATE - 03/27/20   RECEIVED A [facility] [place], [place] CPT CODES 61443 AND [address], [place] [phone], GOOD FROM 08/04/20 - 11/02/20

## 2020-08-04 ENCOUNTER — Encounter: Payer: Self-pay | Admitting: Podiatry

## 2020-08-04 ENCOUNTER — Other Ambulatory Visit: Payer: Self-pay | Admitting: Podiatry

## 2020-08-04 DIAGNOSIS — M2042 Other hammer toe(s) (acquired), left foot: Secondary | ICD-10-CM | POA: Diagnosis not present

## 2020-08-04 DIAGNOSIS — M722 Plantar fascial fibromatosis: Secondary | ICD-10-CM | POA: Diagnosis not present

## 2020-08-04 DIAGNOSIS — M24274 Disorder of ligament, right foot: Secondary | ICD-10-CM | POA: Diagnosis not present

## 2020-08-04 DIAGNOSIS — D2122 Benign neoplasm of connective and other soft tissue of left lower limb, including hip: Secondary | ICD-10-CM | POA: Diagnosis not present

## 2020-08-04 MED ORDER — CEPHALEXIN 500 MG PO CAPS: ORAL_CAPSULE | ORAL | 0 refills | Status: DC

## 2020-08-04 MED ORDER — OXYCODONE HCL 5 MG PO TABS: 5.0000 mg | ORAL_TABLET | ORAL | 0 refills | Status: DC | PRN

## 2020-08-04 NOTE — Progress Notes (Signed)
Rx sent to pharmacy for outpatient surgery. °

## 2020-08-09 ENCOUNTER — Ambulatory Visit (INDEPENDENT_AMBULATORY_CARE_PROVIDER_SITE_OTHER): Payer: PPO | Admitting: Podiatry

## 2020-08-09 ENCOUNTER — Other Ambulatory Visit: Payer: Self-pay

## 2020-08-09 DIAGNOSIS — M722 Plantar fascial fibromatosis: Secondary | ICD-10-CM

## 2020-08-09 DIAGNOSIS — D2122 Benign neoplasm of connective and other soft tissue of left lower limb, including hip: Secondary | ICD-10-CM

## 2020-08-09 DIAGNOSIS — Z9889 Other specified postprocedural states: Secondary | ICD-10-CM

## 2020-08-09 NOTE — Progress Notes (Signed)
  Subjective:  Patient ID: Martin Hanna, male    DOB: [birth year],  MRN: [id number]  Chief Complaint  Patient presents with  . Routine Post Op    POV#1 -tp denies N/V/F/Ch -dressing dry and intact -pt states," it was hurting pretty bad 1st day I got home. Overall pain is better, but still very sore at arch; 4/10." - Tx: sx shoe, crutches and oxycodone   DOS: 08/04/20 Procedure: Excision of fibromas x2, ligament repair of toe   65 y.o. male presents with the above complaint. History confirmed with patient.   Objective:  Physical Exam: tenderness at the surgical site, local edema noted and calf supple, nontender. Incision: healing well, no significant drainage, no dehiscence, no significant erythema  Assessment:   1. Fibroma of left foot   2. Plantar fasciitis   3. Post-operative state     Plan:  Patient was evaluated and treated and all questions answered.  Post-operative State -Dressing applied consisting of sterile gauze, kerlix and ACE bandage -WBAT in Surgical shoe  -Declined pain medication refill today -Fu in 1 week for possible suture removal  Return in about 1 week (around 08/16/2020) for Post-Op (No XRs).

## 2020-08-13 ENCOUNTER — Telehealth: Payer: Self-pay

## 2020-08-13 MED ORDER — OXYCODONE HCL 5 MG PO TABS: 5.0000 mg | ORAL_TABLET | ORAL | 0 refills | Status: DC | PRN

## 2020-08-13 NOTE — Telephone Encounter (Signed)
Pt was informed of RF sent to pharmacy

## 2020-08-13 NOTE — Telephone Encounter (Signed)
Pt's wife called stating they need a RF on oxycodone sent to their pharmacy. Please advice

## 2020-08-13 NOTE — Addendum Note (Signed)
Addended by: Hardie Pulley on: 08/13/2020 10:53 AM   Modules accepted: Orders

## 2020-08-19 ENCOUNTER — Other Ambulatory Visit: Payer: Self-pay

## 2020-08-19 ENCOUNTER — Ambulatory Visit (INDEPENDENT_AMBULATORY_CARE_PROVIDER_SITE_OTHER): Payer: PPO | Admitting: Podiatry

## 2020-08-19 ENCOUNTER — Encounter: Payer: Self-pay | Admitting: Podiatry

## 2020-08-19 DIAGNOSIS — M2042 Other hammer toe(s) (acquired), left foot: Secondary | ICD-10-CM

## 2020-08-19 DIAGNOSIS — D2122 Benign neoplasm of connective and other soft tissue of left lower limb, including hip: Secondary | ICD-10-CM

## 2020-08-19 DIAGNOSIS — M722 Plantar fascial fibromatosis: Secondary | ICD-10-CM

## 2020-08-19 DIAGNOSIS — Z9889 Other specified postprocedural states: Secondary | ICD-10-CM

## 2020-08-19 NOTE — Progress Notes (Signed)
  Subjective:  Patient ID: Martin Hanna, male    DOB: Jul 27, 1955,  MRN: [id number]  Chief Complaint  Patient presents with  . Routine Post Op    The 2nd toe is on the left is leaning to the 3rd toe on the left and I have been walking on the heel and the shoe has rubbed a place    DOS: 08/04/20 Procedure: Excision of fibromas x2, ligament repair of toe   65 y.o. male presents with the above complaint. History confirmed with patient.   Objective:  Physical Exam: tenderness at the surgical site, local edema noted and calf supple, nontender. He does have mild crossover deformity noted. Incision: healing well, no significant drainage, no dehiscence, no significant erythema  Assessment:   1. Fibroma of left foot   2. Plantar fasciitis   3. Hammertoe of left foot   4. Post-operative state     Plan:  Patient was evaluated and treated and all questions answered.  Post-operative State -Dressing applied consisting of sterile gauze, kerlix and ACE bandage -WBAT in Surgical shoe  -Declined pain medication refill today -Sutures removed. Staples left intact for 1 more week. -Ok to shower and get wet. -Consider dorsal capsulotomy at later date if 2nd toe remains laterally deviated.  No follow-ups on file.

## 2020-08-26 ENCOUNTER — Other Ambulatory Visit: Payer: Self-pay

## 2020-08-26 ENCOUNTER — Ambulatory Visit (INDEPENDENT_AMBULATORY_CARE_PROVIDER_SITE_OTHER): Payer: PPO | Admitting: Podiatry

## 2020-08-26 ENCOUNTER — Encounter: Payer: Self-pay | Admitting: Podiatry

## 2020-08-26 DIAGNOSIS — D2122 Benign neoplasm of connective and other soft tissue of left lower limb, including hip: Secondary | ICD-10-CM

## 2020-08-26 DIAGNOSIS — M722 Plantar fascial fibromatosis: Secondary | ICD-10-CM

## 2020-08-26 DIAGNOSIS — Z9889 Other specified postprocedural states: Secondary | ICD-10-CM

## 2020-08-26 NOTE — Progress Notes (Signed)
  Subjective:  Patient ID: Martin Hanna, male    DOB: October 05, 1955,  MRN: [id number]  Chief Complaint  Patient presents with  . Routine Post Op    Ready for the staples to come out and does burn some and the 2nd toe left is crooked a little   DOS: 08/04/20 Procedure: Excision of fibromas x2, ligament repair of toe   65 y.o. male presents with the above complaint. History confirmed with patient.   Objective:  Physical Exam: tenderness at the surgical site, local edema noted and calf supple, nontender. He does have mild crossover deformity noted. Incision: healing well, no significant drainage, no dehiscence, no significant erythema  Assessment:   1. Fibroma of left foot   2. Plantar fasciitis     Plan:  Patient was evaluated and treated and all questions answered.  Post-operative State -Staples removed -Steri-strips applied to the incision -Ok to start showering at this time. Advised they cannot soak. -WBAT in Surgical shoe -Surgical shoe dispensed. Medically necessary for safe ambulation and pressure reduction prior to returning to normal shoegear.  -Consider dorsal capsulotomy at later date if 2nd toe remains laterally deviated.  Return in about 2 weeks (around 09/09/2020) for Post-Op (No XRs).

## 2020-09-09 ENCOUNTER — Encounter: Payer: PPO | Admitting: Podiatry

## 2020-09-15 DIAGNOSIS — I700 Atherosclerosis of aorta: Secondary | ICD-10-CM | POA: Diagnosis not present

## 2020-09-15 DIAGNOSIS — Z6838 Body mass index (BMI) 38.0-38.9, adult: Secondary | ICD-10-CM | POA: Diagnosis not present

## 2020-09-15 DIAGNOSIS — I10 Essential (primary) hypertension: Secondary | ICD-10-CM | POA: Diagnosis not present

## 2020-09-15 DIAGNOSIS — Z1331 Encounter for screening for depression: Secondary | ICD-10-CM | POA: Diagnosis not present

## 2020-09-15 DIAGNOSIS — R7303 Prediabetes: Secondary | ICD-10-CM | POA: Diagnosis not present

## 2020-09-15 DIAGNOSIS — E7800 Pure hypercholesterolemia, unspecified: Secondary | ICD-10-CM | POA: Diagnosis not present

## 2020-09-16 ENCOUNTER — Encounter: Payer: Self-pay | Admitting: Podiatry

## 2020-09-16 ENCOUNTER — Encounter: Payer: PPO | Admitting: Podiatry

## 2020-09-16 ENCOUNTER — Other Ambulatory Visit: Payer: Self-pay

## 2020-09-16 ENCOUNTER — Ambulatory Visit (INDEPENDENT_AMBULATORY_CARE_PROVIDER_SITE_OTHER): Payer: PPO | Admitting: Podiatry

## 2020-09-16 DIAGNOSIS — M2042 Other hammer toe(s) (acquired), left foot: Secondary | ICD-10-CM | POA: Diagnosis not present

## 2020-09-16 NOTE — Progress Notes (Signed)
  Subjective:  Patient ID: JAK HAGGAR, male    DOB: 1955-04-27,  MRN: [id number]  Chief Complaint  Patient presents with   Routine Post Op    I have some tenderness under the ball of the left foot and I have a butterfly pad there and it has a scab and I use a bandaid on the other to cushion it    DOS: 08/04/20 Procedure: Excision of fibromas x2, ligament repair of toe   65 y.o. male presents with the above complaint. History confirmed with patient.   Objective:  Physical Exam: tenderness at the surgical site, local edema noted and calf supple, nontender. He does have mild crossover deformity noted. Incision: almost fully healed.  Assessment:   1. Hammertoe of left foot    Plan:  Patient was evaluated and treated and all questions answered.  Post-operative State -Doing overall quite well. -Scab plantar foot sharply excised. Wound with pinpoint granular area which was cauterized with silver nitrate. -He does have some crossover deformity. Dispensed toe alignment splint - medically necessary to help align the soft tissues as he heals. Advised to only wear this at night. -Ok to transition back to normal shoegear as tolerated.  No follow-ups on file.

## 2020-10-07 ENCOUNTER — Ambulatory Visit (INDEPENDENT_AMBULATORY_CARE_PROVIDER_SITE_OTHER): Payer: PPO | Admitting: Podiatry

## 2020-10-07 DIAGNOSIS — Z5329 Procedure and treatment not carried out because of patient's decision for other reasons: Secondary | ICD-10-CM

## 2020-10-07 DIAGNOSIS — U071 COVID-19: Secondary | ICD-10-CM | POA: Diagnosis not present

## 2020-10-28 ENCOUNTER — Other Ambulatory Visit: Payer: Self-pay

## 2020-10-28 ENCOUNTER — Encounter: Payer: Self-pay | Admitting: Podiatry

## 2020-10-28 ENCOUNTER — Ambulatory Visit (INDEPENDENT_AMBULATORY_CARE_PROVIDER_SITE_OTHER): Payer: PPO | Admitting: Podiatry

## 2020-10-28 DIAGNOSIS — M2042 Other hammer toe(s) (acquired), left foot: Secondary | ICD-10-CM

## 2020-10-28 DIAGNOSIS — D2122 Benign neoplasm of connective and other soft tissue of left lower limb, including hip: Secondary | ICD-10-CM

## 2020-10-28 DIAGNOSIS — M722 Plantar fascial fibromatosis: Secondary | ICD-10-CM

## 2020-10-28 NOTE — Progress Notes (Signed)
  Subjective:  Patient ID: Martin Hanna, male    DOB: 1956-02-01,  MRN: [id number]  Chief Complaint  Patient presents with   Routine Post Op    The 2nd toe on the left is leaning toward the 3rd toe and feels tight and drawing up and can feel it with out shoes   DOS: 08/04/20 Procedure: Excision of fibromas x2, ligament repair of toe   65 y.o. male presents with the above complaint. History confirmed with patient.   Objective:  Physical Exam: tenderness at the surgical site, local edema noted and calf supple, nontender. He does have mild crossover deformity noted. Incision: almost fully healed.  Assessment:   1. Hammertoe of left foot   2. Fibroma of left foot   3. Plantar fasciitis     Plan:  Patient was evaluated and treated and all questions answered.  Post-operative State -Continue toe splint -Consider office extensor tenotomy/capsulotomy to relieve DF contracture. Otherwise could consider surgical intervention of hammertoe corrections 2nd/3rd toes with tenotomy and capsulotomy.  Return in about 3 weeks (around 11/18/2020) for Post-Op (No XRs).

## 2020-11-16 DIAGNOSIS — M1712 Unilateral primary osteoarthritis, left knee: Secondary | ICD-10-CM | POA: Diagnosis not present

## 2020-11-25 ENCOUNTER — Encounter: Payer: PPO | Admitting: Podiatry

## 2020-12-06 ENCOUNTER — Other Ambulatory Visit: Payer: Self-pay

## 2020-12-06 ENCOUNTER — Ambulatory Visit (INDEPENDENT_AMBULATORY_CARE_PROVIDER_SITE_OTHER): Payer: PPO | Admitting: Podiatry

## 2020-12-06 DIAGNOSIS — M722 Plantar fascial fibromatosis: Secondary | ICD-10-CM

## 2020-12-06 DIAGNOSIS — M2042 Other hammer toe(s) (acquired), left foot: Secondary | ICD-10-CM | POA: Diagnosis not present

## 2020-12-06 DIAGNOSIS — D2122 Benign neoplasm of connective and other soft tissue of left lower limb, including hip: Secondary | ICD-10-CM | POA: Diagnosis not present

## 2020-12-06 NOTE — Progress Notes (Signed)
  Subjective:  Patient ID: COLLINS VANDENBOS, male    DOB: Sep 09, 1955,  MRN: [id number]  Chief Complaint  Patient presents with   Routine Post Op    POV -pt states," toe is okay, just aggravating looks and feels the same." - with tighntess at 2nd toe   DOS: 08/04/20 Procedure: Excision of fibromas x2, ligament repair of toe   65 y.o. male presents with the above complaint. History confirmed with patient.   Objective:  Physical Exam: tenderness at the surgical site, local edema noted and calf supple, nontender. He does have mild crossover deformity noted. Incision: almost fully healed. Assessment:   1. Hammertoe of left foot   2. Fibroma of left foot   3. Plantar fasciitis    Plan:  Patient was evaluated and treated and all questions answered.  Post-operative State  -Plantar foot well healed and soft. -Again discussed in-office extensor tenotomy/capsulotomy to relieve DF contracture. Otherwise could consider surgical intervention of hammertoe corrections 2nd/3rd toes with tenotomy and capsulotomy.Patient would like to hold off now but will call back should issues persist  No follow-ups on file.

## 2020-12-16 DIAGNOSIS — Z6838 Body mass index (BMI) 38.0-38.9, adult: Secondary | ICD-10-CM | POA: Diagnosis not present

## 2020-12-16 DIAGNOSIS — E785 Hyperlipidemia, unspecified: Secondary | ICD-10-CM | POA: Diagnosis not present

## 2020-12-16 DIAGNOSIS — Z23 Encounter for immunization: Secondary | ICD-10-CM | POA: Diagnosis not present

## 2020-12-16 DIAGNOSIS — E1169 Type 2 diabetes mellitus with other specified complication: Secondary | ICD-10-CM | POA: Diagnosis not present

## 2020-12-16 DIAGNOSIS — E7800 Pure hypercholesterolemia, unspecified: Secondary | ICD-10-CM | POA: Diagnosis not present

## 2020-12-16 DIAGNOSIS — I10 Essential (primary) hypertension: Secondary | ICD-10-CM | POA: Diagnosis not present

## 2021-02-10 DIAGNOSIS — Z139 Encounter for screening, unspecified: Secondary | ICD-10-CM | POA: Diagnosis not present

## 2021-02-10 DIAGNOSIS — Z9181 History of falling: Secondary | ICD-10-CM | POA: Diagnosis not present

## 2021-02-10 DIAGNOSIS — R0789 Other chest pain: Secondary | ICD-10-CM | POA: Diagnosis not present

## 2021-02-10 DIAGNOSIS — R0600 Dyspnea, unspecified: Secondary | ICD-10-CM | POA: Diagnosis not present

## 2021-02-10 DIAGNOSIS — G8929 Other chronic pain: Secondary | ICD-10-CM | POA: Diagnosis not present

## 2021-02-10 DIAGNOSIS — Z6838 Body mass index (BMI) 38.0-38.9, adult: Secondary | ICD-10-CM | POA: Diagnosis not present

## 2021-02-22 DIAGNOSIS — M1712 Unilateral primary osteoarthritis, left knee: Secondary | ICD-10-CM | POA: Diagnosis not present

## 2021-02-24 DIAGNOSIS — R0789 Other chest pain: Secondary | ICD-10-CM | POA: Diagnosis not present

## 2021-02-24 DIAGNOSIS — I700 Atherosclerosis of aorta: Secondary | ICD-10-CM | POA: Diagnosis not present

## 2021-02-24 DIAGNOSIS — J9811 Atelectasis: Secondary | ICD-10-CM | POA: Diagnosis not present

## 2021-04-01 DIAGNOSIS — I700 Atherosclerosis of aorta: Secondary | ICD-10-CM | POA: Diagnosis not present

## 2021-04-01 DIAGNOSIS — M1712 Unilateral primary osteoarthritis, left knee: Secondary | ICD-10-CM | POA: Diagnosis not present

## 2021-04-01 DIAGNOSIS — Z01818 Encounter for other preprocedural examination: Secondary | ICD-10-CM | POA: Diagnosis not present

## 2021-04-01 DIAGNOSIS — E1169 Type 2 diabetes mellitus with other specified complication: Secondary | ICD-10-CM | POA: Diagnosis not present

## 2021-04-01 DIAGNOSIS — H6982 Other specified disorders of Eustachian tube, left ear: Secondary | ICD-10-CM | POA: Diagnosis not present

## 2021-04-01 DIAGNOSIS — E7800 Pure hypercholesterolemia, unspecified: Secondary | ICD-10-CM | POA: Diagnosis not present

## 2021-04-01 DIAGNOSIS — I10 Essential (primary) hypertension: Secondary | ICD-10-CM | POA: Diagnosis not present

## 2021-04-01 DIAGNOSIS — E785 Hyperlipidemia, unspecified: Secondary | ICD-10-CM | POA: Diagnosis not present

## 2021-04-06 DIAGNOSIS — E7800 Pure hypercholesterolemia, unspecified: Secondary | ICD-10-CM | POA: Diagnosis not present

## 2021-04-06 DIAGNOSIS — E1169 Type 2 diabetes mellitus with other specified complication: Secondary | ICD-10-CM | POA: Diagnosis not present

## 2021-04-06 DIAGNOSIS — Z01818 Encounter for other preprocedural examination: Secondary | ICD-10-CM | POA: Diagnosis not present

## 2021-04-20 DIAGNOSIS — Z01818 Encounter for other preprocedural examination: Secondary | ICD-10-CM | POA: Diagnosis not present

## 2021-05-01 DIAGNOSIS — N390 Urinary tract infection, site not specified: Secondary | ICD-10-CM | POA: Diagnosis not present

## 2021-05-02 DIAGNOSIS — R339 Retention of urine, unspecified: Secondary | ICD-10-CM | POA: Diagnosis not present

## 2021-05-02 DIAGNOSIS — N390 Urinary tract infection, site not specified: Secondary | ICD-10-CM | POA: Diagnosis not present

## 2021-05-03 ENCOUNTER — Encounter: Payer: Self-pay | Admitting: *Deleted

## 2021-05-03 ENCOUNTER — Inpatient Hospital Stay
Admission: EM | Admit: 2021-05-03 | Discharge: 2021-05-06 | DRG: 690 | Disposition: A | Discharge status: Home / Self Care | Payer: Medicare HMO | Attending: Internal Medicine | Admitting: Internal Medicine

## 2021-05-03 ENCOUNTER — Other Ambulatory Visit: Payer: Self-pay

## 2021-05-03 ENCOUNTER — Emergency Department: Payer: Medicare HMO

## 2021-05-03 DIAGNOSIS — G4730 Sleep apnea, unspecified: Secondary | ICD-10-CM | POA: Diagnosis present

## 2021-05-03 DIAGNOSIS — R300 Dysuria: Secondary | ICD-10-CM | POA: Diagnosis not present

## 2021-05-03 DIAGNOSIS — Z87440 Personal history of urinary (tract) infections: Secondary | ICD-10-CM

## 2021-05-03 DIAGNOSIS — G629 Polyneuropathy, unspecified: Secondary | ICD-10-CM | POA: Diagnosis present

## 2021-05-03 DIAGNOSIS — Z20822 Contact with and (suspected) exposure to covid-19: Secondary | ICD-10-CM | POA: Diagnosis not present

## 2021-05-03 DIAGNOSIS — R109 Unspecified abdominal pain: Secondary | ICD-10-CM | POA: Diagnosis not present

## 2021-05-03 DIAGNOSIS — Z7982 Long term (current) use of aspirin: Secondary | ICD-10-CM

## 2021-05-03 DIAGNOSIS — R0789 Other chest pain: Secondary | ICD-10-CM | POA: Diagnosis not present

## 2021-05-03 DIAGNOSIS — Z79899 Other long term (current) drug therapy: Secondary | ICD-10-CM | POA: Diagnosis not present

## 2021-05-03 DIAGNOSIS — G8929 Other chronic pain: Secondary | ICD-10-CM | POA: Diagnosis present

## 2021-05-03 DIAGNOSIS — N401 Enlarged prostate with lower urinary tract symptoms: Secondary | ICD-10-CM | POA: Diagnosis not present

## 2021-05-03 DIAGNOSIS — E7800 Pure hypercholesterolemia, unspecified: Secondary | ICD-10-CM | POA: Diagnosis present

## 2021-05-03 DIAGNOSIS — Z888 Allergy status to other drugs, medicaments and biological substances status: Secondary | ICD-10-CM

## 2021-05-03 DIAGNOSIS — R319 Hematuria, unspecified: Secondary | ICD-10-CM

## 2021-05-03 DIAGNOSIS — I700 Atherosclerosis of aorta: Secondary | ICD-10-CM | POA: Diagnosis not present

## 2021-05-03 DIAGNOSIS — I10 Essential (primary) hypertension: Secondary | ICD-10-CM | POA: Diagnosis present

## 2021-05-03 DIAGNOSIS — Z882 Allergy status to sulfonamides status: Secondary | ICD-10-CM | POA: Diagnosis not present

## 2021-05-03 DIAGNOSIS — R9431 Abnormal electrocardiogram [ECG] [EKG]: Secondary | ICD-10-CM | POA: Diagnosis not present

## 2021-05-03 DIAGNOSIS — Z1612 Extended spectrum beta lactamase (ESBL) resistance: Secondary | ICD-10-CM | POA: Diagnosis present

## 2021-05-03 DIAGNOSIS — N390 Urinary tract infection, site not specified: Principal | ICD-10-CM | POA: Diagnosis present

## 2021-05-03 DIAGNOSIS — R350 Frequency of micturition: Secondary | ICD-10-CM | POA: Diagnosis not present

## 2021-05-03 DIAGNOSIS — Z8619 Personal history of other infectious and parasitic diseases: Secondary | ICD-10-CM | POA: Diagnosis present

## 2021-05-03 LAB — URINALYSIS, ROUTINE W REFLEX MICROSCOPIC
Bilirubin Urine: NEGATIVE
Glucose, UA: NEGATIVE mg/dL
Ketones, ur: NEGATIVE mg/dL
Nitrite: NEGATIVE
Protein, ur: NEGATIVE mg/dL
Specific Gravity, Urine: 1.014 (ref 1.005–1.030)
WBC, UA: >50 WBC/hpf — ABNORMAL HIGH (ref 0–5)
pH: 5 (ref 5.0–8.0)

## 2021-05-03 LAB — CBC
HCT: 48.3 % (ref 39.0–52.0)
Hemoglobin: 16.4 g/dL (ref 13.0–17.0)
MCH: 30.1 pg (ref 26.0–34.0)
MCHC: 34 g/dL (ref 30.0–36.0)
MCV: 88.6 fL (ref 80.0–100.0)
Platelets: 218 10*3/uL (ref 150–400)
RBC: 5.45 MIL/uL (ref 4.22–5.81)
RDW: 12.4 % (ref 11.5–15.5)
WBC: 15.2 10*3/uL — ABNORMAL HIGH (ref 4.0–10.5)
nRBC: 0 % (ref 0.0–0.2)

## 2021-05-03 LAB — COMPREHENSIVE METABOLIC PANEL
ALT: 37 U/L (ref 0–44)
AST: 28 U/L (ref 15–41)
Albumin: 4.1 g/dL (ref 3.5–5.0)
Alkaline Phosphatase: 48 U/L (ref 38–126)
Anion gap: 10 (ref 5–15)
BUN: 17 mg/dL (ref 8–23)
CO2: 26 mmol/L (ref 22–32)
Calcium: 9.4 mg/dL (ref 8.9–10.3)
Chloride: 99 mmol/L (ref 98–111)
Creatinine, Ser: 0.92 mg/dL (ref 0.61–1.24)
GFR, Estimated: >60 mL/min (ref >60)
Glucose, Bld: 171 mg/dL — ABNORMAL HIGH (ref 70–99)
Potassium: 3.7 mmol/L (ref 3.5–5.1)
Sodium: 135 mmol/L (ref 135–145)
Total Bilirubin: 0.6 mg/dL (ref 0.3–1.2)
Total Protein: 7.5 g/dL (ref 6.5–8.1)

## 2021-05-03 MED ORDER — ACETAMINOPHEN 650 MG RE SUPP: 650.0000 mg | Freq: Four times a day (QID) | RECTAL | Status: DC | PRN

## 2021-05-03 MED ORDER — ACETAMINOPHEN 325 MG PO TABS: 650.0000 mg | ORAL_TABLET | Freq: Four times a day (QID) | ORAL | Status: DC | PRN

## 2021-05-03 MED ORDER — ENOXAPARIN SODIUM 60 MG/0.6ML IJ SOSY
0.5000 mg/kg | PREFILLED_SYRINGE | INTRAMUSCULAR | Status: DC
Start: 2021-05-04 — End: 2021-05-06
  Administered 2021-05-04 – 2021-05-06 (×3): 52.5 mg via SUBCUTANEOUS
  Filled 2021-05-03 (×3): qty 0.6

## 2021-05-03 MED ORDER — ONDANSETRON HCL 4 MG PO TABS: 4.0000 mg | ORAL_TABLET | Freq: Four times a day (QID) | ORAL | Status: DC | PRN

## 2021-05-03 MED ORDER — SODIUM CHLORIDE 0.9 % IV SOLN
1.0000 g | Freq: Once | INTRAVENOUS | Status: AC
  Administered 2021-05-03: 1 g via INTRAVENOUS
  Filled 2021-05-03: qty 20

## 2021-05-03 MED ORDER — ONDANSETRON HCL 4 MG/2ML IJ SOLN: 4.0000 mg | Freq: Four times a day (QID) | INTRAMUSCULAR | Status: DC | PRN

## 2021-05-03 NOTE — Progress Notes (Signed)
PHARMACIST - PHYSICIAN COMMUNICATION  CONCERNING:  Enoxaparin (Lovenox) for DVT Prophylaxis    RECOMMENDATION: Patient was prescribed enoxaprin 40mg  q24 hours for VTE prophylaxis.   Filed Weights   05/03/21 1931  Weight: 104.3 kg (230 lb)    Body mass index is 38.27 kg/m.  Estimated Creatinine Clearance: 85.4 mL/min (by C-G formula based on SCr of 0.92 mg/dL).   Based on [place] patient is candidate for enoxaparin 0.5mg /kg TBW SQ every 24 hours based on BMI being >30.   DESCRIPTION: Pharmacy has adjusted enoxaparin dose per [facility] policy.  Patient is now receiving enoxaparin 0.5 mg/kg every 24 hours   Renda Rolls, PharmD, [facility] 05/03/2021 11:38 PM

## 2021-05-03 NOTE — H&P (Signed)
History and Physical    Patient: Martin Hanna ZTI:[id number] DOB: 1956-01-08 DOA: 05/03/2021 DOS: the patient was seen and examined on 05/04/2021 PCP: Leonides Sake, MD  Patient coming from: ALF/ILF  Chief Complaint:  Chief Complaint  Patient presents with   Urinary Retention    HPI: [name] is a 66 y.o. male with medical history significant of HTN, BPH with LUTS, sleep apnea, hospitalization in November 2021 for sepsis from ESBL E. coli UTI, who presents to the ED with urinary frequency, bilateral flank pain and subjective fevers.  He is currently on Cipro for suspected UTI but symptoms have not been improving.  He has had no nausea or vomiting or diarrhea  ED course: Tmax 99.6, pulse 100 with otherwise normal vitals WBC 15,000 with CBC and CMP otherwise unremarkable UA with large leukocyte esterase greater than 50 WBC per hpf and many bacteria  EKG, personally viewed and interpreted: Sinus at 84 with no acute ST-T wave changes  Patient started on meropenem.  Hospitalist consulted for admission.   Review of Systems: As mentioned in the history of present illness. All other systems reviewed and are negative. Past Medical History:  Diagnosis Date   Hypercholesteremia    Hypertension    Neuropathy    Seizures (HCC)    x61 at 66 years old; none before or since that time   Sleep apnea    Past Surgical History:  Procedure Laterality Date   APPENDECTOMY     CHOLECYSTECTOMY     COLONOSCOPY N/A 02/12/2015   Procedure: COLONOSCOPY;  Surgeon: Manya Silvas, MD;  Location: [facility];  Service: Endoscopy;  Laterality: N/A;   GANGLION CYST EXCISION     HERNIA REPAIR     TYMPANOPLASTY Left 09/15/2016   Procedure: LEFT SIDE TYMPANOPLASTY;  Surgeon: Rozetta Nunnery, MD;  Location: [facility];  Service: ENT;  Laterality: Left;   Social History:  reports that he has never smoked. He has never used smokeless tobacco. He reports that he does not  drink alcohol and does not use drugs.  Allergies  Allergen Reactions   Sulfamethoxazole Swelling   Crestor [Rosuvastatin Calcium]     Causes muscle cramps   Rosuvastatin Other (See Comments)    Other reaction(s): Cramps (ALLERGY/intolerance) Causes muscle cramps Causes muscle cramps    Rosuvastatin Calcium Other (See Comments)    Causes muscle cramps    No family history on file.  Prior to Admission medications   Medication Sig Start Date End Date Taking? Authorizing Provider  alfuzosin (UROXATRAL) 10 MG 24 hr tablet Take 10 mg by mouth daily with breakfast.    [provider]  amLODipine (NORVASC) 10 MG tablet Take 10 mg by mouth daily. In the evening    [provider]  aspirin 81 MG tablet Take 81 mg by mouth daily.    [provider]  benzonatate (TESSALON) 200 MG capsule Take 200 mg by mouth 3 (three) times daily as needed. 10/07/20   [provider]  cephALEXin (KEFLEX) 500 MG capsule Take 1 tablet by mouth tonight, then one tomorrow AM and one in PM 08/04/20   Price, Christian Mate, DPM  CINNAMON PO Take by mouth.    [provider]  dutasteride (AVODART) 0.5 MG capsule Take 0.5 mg by mouth daily. 06/13/20   [provider]  losartan-hydrochlorothiazide (HYZAAR) 100-25 MG tablet Take 1 tablet by mouth daily. 09/11/20   [provider]  methenamine (MANDELAMINE) 1 g tablet SMARTSIG:1  Tablet(s) By Mouth Every 12 Hours 04/19/20   [provider]  Multiple Vitamins-Minerals (MULTIVITAMIN WITH MINERALS) tablet Take 1 tablet by mouth daily.    [provider]  Omega-3 Fatty Acids (FISH OIL) 1200 MG CAPS Take 1,200 mg by mouth daily.     [provider]  oxyCODONE (ROXICODONE) 5 MG immediate release tablet Take 1 tablet (5 mg total) by mouth every 4 (four) hours as needed for severe pain. 08/13/20   Evelina Bucy, DPM  PAXLOVID 20 x 150 MG & 10 x 100MG  TBPK Take 3 tablets by mouth 2 (two) times daily.  10/07/20   [provider]  rosuvastatin (CRESTOR) 5 MG tablet Take 5 mg by mouth once a week. 05/24/20   [provider]  silodosin (RAPAFLO) 8 MG CAPS capsule Take 8 mg by mouth daily. 04/08/20   [provider]  valsartan-hydrochlorothiazide (DIOVAN-HCT) 160-25 MG tablet Take 1 tablet by mouth daily.    [provider]    Physical Exam: Vitals:   05/03/21 1930 05/03/21 1931 05/03/21 2204  BP: 124/79  127/69  Pulse: 100  85  Resp: 20  17  Temp: 99.6 F (37.6 C)    TempSrc: Oral    SpO2: 94%  93%  Weight:  104.3 kg   Height:  5\' 5"  (1.651 m)    Physical Exam Vitals and nursing note reviewed.  Constitutional:      General: He is not in acute distress.    Appearance: Normal appearance.  HENT:     Head: Normocephalic and atraumatic.  Cardiovascular:     Rate and Rhythm: Normal rate and regular rhythm.     Pulses: Normal pulses.     Heart sounds: Normal heart sounds. No murmur heard. Pulmonary:     Effort: Pulmonary effort is normal.     Breath sounds: Normal breath sounds. No wheezing or rhonchi.  Abdominal:     General: Bowel sounds are normal.     Palpations: Abdomen is soft.     Tenderness: There is no abdominal tenderness.  Musculoskeletal:        General: No swelling or tenderness. Normal range of motion.     Cervical back: Normal range of motion and neck supple.  Skin:    General: Skin is warm and dry.  Neurological:     General: No focal deficit present.     Mental Status: He is alert. Mental status is at baseline.  Psychiatric:        Mood and Affect: Mood normal.        Behavior: Behavior normal.     Data Reviewed: Notes from primary care and specialist visits, past discharge summaries. Prior diagnostic testing as applicable to current admission diagnoses Updated medications and problem lists for reconciliation ED course, including vitals, labs, imaging, treatment and response to treatment Triage notes and ED providers  notes   Assessment and Plan: * UTI (urinary tract infection) We will continue meropenem started from the emergency room in view of failure of outpatient Cipro and prior ESBL Follow-up cultures Patient with low-grade fever, tachycardia and leukocytosis with possible low-grade sepsis Follow-up lactic acid Treat empirically with IV hydration pending lactic acid I will assist in sepsis diagnosis We will get CT abdomen and pelvis to evaluate for obstruction....non acute  History of ESBL E. coli UTI Management as outlined above under UTI  Essential hypertension- (present on admission) Continue amlodipine and losartan HCTZ  Benign prostatic hyperplasia with urinary frequency- (present on admission)  Continue Uroxatrol       Advance Care Planning:   Code Status: Full Code full  Consults: none  Family Communication: none  Severity of Illness: The appropriate patient status for this patient is INPATIENT. Inpatient status is judged to be reasonable and necessary in order to provide the required intensity of service to ensure the patient's safety. The patient's presenting symptoms, physical exam findings, and initial radiographic and laboratory data in the context of their chronic comorbidities is felt to place them at high risk for further clinical deterioration. Furthermore, it is not anticipated that the patient will be medically stable for discharge from the hospital within 2 midnights of admission.   * I certify that at the point of admission it is my clinical judgment that the patient will require inpatient hospital care spanning beyond 2 midnights from the point of admission due to high intensity of service, high risk for further deterioration and high frequency of surveillance required.*  Author: Athena Masse, MD 05/04/2021 1:29 AM  For on call review [email or web address].

## 2021-05-03 NOTE — Assessment & Plan Note (Addendum)
Management as outlined above under UTI. Treated with empiric meropenem until cultures result.  Prior infection in November 8403 complicated by bacteremia, required 2-week course IV antibiotics. Urine cultures back showing Macrobid sensitive. Complete course with PO Macrobid

## 2021-05-03 NOTE — ED Triage Notes (Signed)
Pt has urinary retention.  Pt states sx for 3 days.  Intermittent fever.  Pt taking cipro now for uti.  Pt alert  speech clear.

## 2021-05-03 NOTE — Assessment & Plan Note (Addendum)
Treated with empiric meropenem given history of ESBL.  Urine culture grew ESBL, covered by meropenem.  Transition to Macrobid to complete course.

## 2021-05-03 NOTE — Assessment & Plan Note (Addendum)
Continue home regimen. Monitor for urinary retention. Outpatient urology follow up.

## 2021-05-03 NOTE — ED Provider Notes (Signed)
[facility] Provider Note    Event Date/Time   First MD Initiated Contact with Patient 05/03/21 2104     (approximate)   History   Urinary Retention   HPI Martin Hanna is a 66 y.o. 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  Patient arrives stating that he has only passed a small amount of urine over the last 24 hours but feels that he needs to go every 5 minutes.  Patient does endorse associated dysuria.  Patient denies abdominal swelling.  Patient had a bladder scan in our triage area that only showed approximately 50 cc of urine.  Patient also endorses associated bilateral flank pain and subjective fevers.     Physical Exam   Triage Vital Signs: ED Triage Vitals  Enc Vitals Group     BP 05/03/21 1930 124/79     Pulse Rate 05/03/21 1930 100     Resp 05/03/21 1930 20     Temp 05/03/21 1930 99.6 F (37.6 C)     Temp Source 05/03/21 1930 Oral     SpO2 05/03/21 1930 94 %     Weight 05/03/21 1931 230 lb (104.3 kg)     Height 05/03/21 1931 5\' 5"  (1.651 m)     Head Circumference --      Peak Flow --      Pain Score 05/03/21 1931 0     Pain Loc --      Pain Edu? --      Excl. in [place]? --     Most recent vital signs: Vitals:   05/03/21 1930 05/03/21 2204  BP: 124/79 127/69  Pulse: 100 85  Resp: 20 17  Temp: 99.6 F (37.6 C)   SpO2: 94% 93%    General: Awake, oriented x4 CV:  Good peripheral perfusion.  Resp:  Normal effort.  Abd:  No distention.  Left CVA tenderness to percussion Other:  Elderly obese Caucasian male laying in bed in no distress   ED Results / Procedures / Treatments   Labs (all labs ordered are listed, but only abnormal results are displayed) Labs Reviewed  COMPREHENSIVE METABOLIC PANEL - Abnormal; Notable for the following components:      Result Value   Glucose, Bld 171 (*)    All other components within  normal limits  CBC - Abnormal; Notable for the following components:   WBC 15.2 (*)    All other components within normal limits  URINALYSIS, ROUTINE W REFLEX MICROSCOPIC - Abnormal; Notable for the following components:   Color, Urine YELLOW (*)    APPearance CLOUDY (*)    Hgb urine dipstick MODERATE (*)    Leukocytes,Ua LARGE (*)    WBC, UA >50 (*)    Bacteria, UA MANY (*)    All other components within normal limits  URINE CULTURE   PROCEDURES:  Critical Care performed: No  .1-3 Lead EKG Interpretation Performed by: Naaman Plummer, MD Authorized by: Naaman Plummer, MD     Interpretation: normal     ECG rate:  84   ECG rate assessment: normal     Rhythm: sinus rhythm     Ectopy: none     Conduction: normal     MEDICATIONS ORDERED IN ED: Medications  meropenem (MERREM) 1 g in sodium chloride 0.9 % 100 mL IVPB (has no administration in time range)  IMPRESSION / MDM / ASSESSMENT AND PLAN / ED COURSE  I reviewed the triage vital signs and the nursing notes.                             Differential diagnosis includes, but is not limited to, urosepsis, pyelonephritis, kidney stone, pneumonia  The patient is on the cardiac monitor to evaluate for evidence of arrhythmia and/or significant heart rate changes.  Patient is a 66 year old male who presents for urinary retention in the setting of urinary tract infection that is currently being treated outpatient with ciprofloxacin.  Patient endorses dysuria, CVA tenderness, and reactive fevers.  CBC shows evidence of leukocytosis to 15.  UA shows positive hemoglobin, leukocytes, bacteria consistent with urinary tract infection.  Given patient has a history of ESBL E. coli UTI in the past as well as failure of outpatient antibiotics, patient will require admission to the internal medicine service for further evaluation management.  Dispo: Admit to medicine      FINAL CLINICAL IMPRESSION(S) / ED DIAGNOSES   Final  diagnoses:  Urinary tract infection with hematuria, site unspecified  Dysuria     Rx / DC Orders   ED Discharge Orders     None        Note:  This document was prepared using Dragon voice recognition software and may include unintentional dictation errors.   Naaman Plummer, MD 05/03/21 2252

## 2021-05-03 NOTE — ED Notes (Signed)
Patient transported to CT 

## 2021-05-03 NOTE — Assessment & Plan Note (Addendum)
Blood pressures were controlled with antihypertensives held on admission.  Home regimen appears to be amlodipine, losartan-HCTZ  Once BP's elevated, resumed on home meds. PCP follow up.

## 2021-05-03 NOTE — Progress Notes (Signed)
PHARMACY -  BRIEF ANTIBIOTIC NOTE   Pharmacy has received consult(s) for Meropenem for tx of UTI w/ history of ESBL  E. Coli from an ED provider.  The patient's profile has been reviewed for ht/wt/allergies/indication/available labs.    One time order(s) placed for Meropenem 1 gm once STAT  Further antibiotics/pharmacy consults should be ordered by admitting physician if indicated.                       Renda Rolls, PharmD, [facility] 05/03/2021 10:51 PM

## 2021-05-04 DIAGNOSIS — Z8619 Personal history of other infectious and parasitic diseases: Secondary | ICD-10-CM

## 2021-05-04 DIAGNOSIS — R350 Frequency of micturition: Secondary | ICD-10-CM

## 2021-05-04 DIAGNOSIS — N401 Enlarged prostate with lower urinary tract symptoms: Secondary | ICD-10-CM | POA: Diagnosis not present

## 2021-05-04 DIAGNOSIS — N390 Urinary tract infection, site not specified: Secondary | ICD-10-CM | POA: Diagnosis not present

## 2021-05-04 LAB — LACTIC ACID, PLASMA
Lactic Acid, Venous: 1.6 mmol/L (ref 0.5–1.9)
Lactic Acid, Venous: 1.6 mmol/L (ref 0.5–1.9)

## 2021-05-04 LAB — RESP PANEL BY RT-PCR (FLU A&B, COVID) ARPGX2
Influenza A by PCR: NEGATIVE
Influenza B by PCR: NEGATIVE
SARS Coronavirus 2 by RT PCR: NEGATIVE

## 2021-05-04 LAB — HIV ANTIBODY (ROUTINE TESTING W REFLEX): HIV Screen 4th Generation wRfx: NONREACTIVE

## 2021-05-04 MED ORDER — SODIUM CHLORIDE 0.9 % IV SOLN
1.0000 g | Freq: Three times a day (TID) | INTRAVENOUS | Status: DC
  Administered 2021-05-04 – 2021-05-06 (×7): 1 g via INTRAVENOUS
  Filled 2021-05-04 (×3): qty 20
  Filled 2021-05-04: qty 1
  Filled 2021-05-04 (×2): qty 20
  Filled 2021-05-04: qty 1
  Filled 2021-05-04: qty 20
  Filled 2021-05-04: qty 1
  Filled 2021-05-04: qty 20

## 2021-05-04 MED ORDER — ASPIRIN EC 81 MG PO TBEC
81.0000 mg | DELAYED_RELEASE_TABLET | Freq: Every day | ORAL | Status: DC
  Administered 2021-05-04 – 2021-05-06 (×3): 81 mg via ORAL
  Filled 2021-05-04 (×3): qty 1

## 2021-05-04 MED ORDER — ROSUVASTATIN CALCIUM 10 MG PO TABS: 5.0000 mg | ORAL_TABLET | ORAL | Status: DC

## 2021-05-04 MED ORDER — ALFUZOSIN HCL ER 10 MG PO TB24
10.0000 mg | ORAL_TABLET | Freq: Every day | ORAL | Status: DC
  Administered 2021-05-05 – 2021-05-06 (×2): 10 mg via ORAL
  Filled 2021-05-04 (×2): qty 1

## 2021-05-04 MED ORDER — DUTASTERIDE 0.5 MG PO CAPS
0.5000 mg | ORAL_CAPSULE | Freq: Every day | ORAL | Status: DC
  Administered 2021-05-04 – 2021-05-06 (×3): 0.5 mg via ORAL
  Filled 2021-05-04 (×3): qty 1

## 2021-05-04 MED ORDER — OXYCODONE HCL 5 MG PO TABS: 5.0000 mg | ORAL_TABLET | ORAL | Status: DC | PRN

## 2021-05-04 MED ORDER — METHENAMINE MANDELATE 1 G PO TABS
1000.0000 mg | ORAL_TABLET | Freq: Two times a day (BID) | ORAL | Status: DC
  Administered 2021-05-04 – 2021-05-06 (×5): 1000 mg via ORAL
  Filled 2021-05-04 (×6): qty 1

## 2021-05-04 NOTE — Hospital Course (Signed)
Martin Hanna is a 66 y.o. male with medical history significant of HTN, BPH with LUTS, sleep apnea, hospitalization in November 2021 for sepsis from ESBL E. coli UTI, who presented to the ED on 05/03/2021 with urinary frequency, bilateral flank pain and subjective fevers.  He had been started on Cipro in outpatient setting for suspected UTI but symptoms have not been improving.    ED course: Tmax 99.6, pulse 100 with otherwise normal vitals.  WBC 15,000 with labs otherwise unremarkable.  UA with large leukocyte esterase greater than 50 WBC per hpf and many bacteria.  Started on meropenem given history of ESBL UTI pending urine culture.

## 2021-05-04 NOTE — Clinical Social Work Note (Cosign Needed)
°  Transition of Care [facility]) Screening Note   Patient Details  Name: Martin Hanna Date of Birth: September 10, 1955   Transition of Care [facility]) CM/SW Contact:    Eileen Stanford, LCSW Phone [place] 05/04/2021, 1:42 PM    Transition of Care Department [facility]) has reviewed patient and no TOC needs have been identified at this time. We will continue to monitor patient advancement through interdisciplinary progression rounds. If new patient transition needs arise, please place a TOC consult.

## 2021-05-04 NOTE — Progress Notes (Signed)
Progress Note   Patient: Martin Hanna QMG:[id number] DOB: 12/17/1955 DOA: 05/03/2021     1 DOS: the patient was seen and examined on 05/04/2021   Brief hospital course: Martin Hanna is a 66 y.o. male with medical history significant of HTN, BPH with LUTS, sleep apnea, hospitalization in November 2021 for sepsis from ESBL E. coli UTI, who presented to the ED on 05/03/2021 with urinary frequency, bilateral flank pain and subjective fevers.  He had been started on Cipro in outpatient setting for suspected UTI but symptoms have not been improving.    ED course: Tmax 99.6, pulse 100 with otherwise normal vitals.  WBC 15,000 with labs otherwise unremarkable.  UA with large leukocyte esterase greater than 50 WBC per hpf and many bacteria.  Started on meropenem given history of ESBL UTI pending urine culture.  Assessment and Plan: * UTI (urinary tract infection)- (present on admission) Continue empiric meropenem given history of ESBL.  Follow urine culture.  History of ESBL E. coli UTI- (present on admission) Management as outlined above under UTI. On empiric meropenem until cultures result.  Prior infection in November 3267 complicated by bacteremia, required 2-week course IV antibiotics.  Benign prostatic hyperplasia with urinary frequency- (present on admission) Continue alfuzosin, dutasteride, methenamine.  Silodosin not on formulary and patient has sulfa allergy, hold Flomax formulary substitution. Monitor for urinary retention  Essential hypertension- (present on admission) Blood pressures are controlled with antihypertensives held.  Home regimen appears to be amlodipine, losartan-HCTZ (or valsartan-HCTZ, confirm which is active) -- Resume meds when indicated  Hypercholesteremia- (present on admission) Resume statin  Chronic chest wall pain- (present on admission) Continue home oxycodone        Subjective: Patient seen with wife at bedside today.  He reports ongoing  urinary frequency but improved.  He had not been able to void prior to coming in.  Reports last time he had an infection he got into his bloodstream and he needed a PICC line for longer IV antibiotics.  He denies fevers or chills, nausea abdominal pain vomiting.  Reports long history of prostate enlargement  Physical Exam: Vitals:   05/04/21 0142 05/04/21 0416 05/04/21 0730 05/04/21 1144  BP: 123/80 128/84 (!) 146/87 138/90  Pulse: 73 67 76 78  Resp: 20 20 14 16   Temp: 98.6 F (37 C) 98.5 F (36.9 C) 98 F (36.7 C) 98.7 F (37.1 C)  TempSrc: Oral Oral Oral Oral  SpO2: 94% 93% 92% 95%  Weight:      Height:       General exam: awake, alert, no acute distress, obese HEENT: atraumatic, clear conjunctiva, anicteric sclera, moist mucus membranes, hearing grossly normal  Respiratory system: normal respiratory effort, on room air. Cardiovascular system: normal S1/S2, RRR, no pedal edema.   Gastrointestinal system: Protuberant abdomen, nontender. Central nervous system: A&O x3. no gross focal neurologic deficits, normal speech Extremities: moves all, no edema, normal tone Skin: dry, intact, normal temperature Psychiatry: normal mood, congruent affect, judgement and insight appear normal   Data Reviewed:  Labs reviewed and notable for glucose 171 otherwise CMP unremarkable.  Lactic acid 1.6 twice, white count 15.2, urinalysis cloudy with moderate hemoglobin, large leukocytes, negative nitrite, many bacteria, greater than 50 WBCs .  CT abdomen and pelvis negative for anything acute  Family Communication: Wife at bedside on rounds  Disposition: Status is: Inpatient Remains inpatient appropriate because: On empiric IV antibiotic pending urine culture          Planned Discharge Destination:  Home     Time spent: 35 minutes  Author: Ezekiel Slocumb, DO 05/04/2021 1:21 PM  For on call review [email or web address].

## 2021-05-04 NOTE — Assessment & Plan Note (Signed)
Resume statin 

## 2021-05-04 NOTE — Assessment & Plan Note (Signed)
-   Continue home oxycodone 

## 2021-05-05 DIAGNOSIS — N390 Urinary tract infection, site not specified: Secondary | ICD-10-CM | POA: Diagnosis not present

## 2021-05-05 LAB — BASIC METABOLIC PANEL
Anion gap: 8 (ref 5–15)
BUN: 19 mg/dL (ref 8–23)
CO2: 30 mmol/L (ref 22–32)
Calcium: 8.5 mg/dL — ABNORMAL LOW (ref 8.9–10.3)
Chloride: 100 mmol/L (ref 98–111)
Creatinine, Ser: 0.68 mg/dL (ref 0.61–1.24)
GFR, Estimated: >60 mL/min (ref >60)
Glucose, Bld: 137 mg/dL — ABNORMAL HIGH (ref 70–99)
Potassium: 3.9 mmol/L (ref 3.5–5.1)
Sodium: 138 mmol/L (ref 135–145)

## 2021-05-05 LAB — CBC
HCT: 46.3 % (ref 39.0–52.0)
Hemoglobin: 15.3 g/dL (ref 13.0–17.0)
MCH: 28.9 pg (ref 26.0–34.0)
MCHC: 33 g/dL (ref 30.0–36.0)
MCV: 87.5 fL (ref 80.0–100.0)
Platelets: 202 10*3/uL (ref 150–400)
RBC: 5.29 MIL/uL (ref 4.22–5.81)
RDW: 12.5 % (ref 11.5–15.5)
WBC: 8 10*3/uL (ref 4.0–10.5)
nRBC: 0 % (ref 0.0–0.2)

## 2021-05-05 MED ORDER — AMLODIPINE BESYLATE 10 MG PO TABS
10.0000 mg | ORAL_TABLET | Freq: Every day | ORAL | Status: DC
  Administered 2021-05-05: 21:00:00 10 mg via ORAL
  Filled 2021-05-05: qty 1

## 2021-05-05 MED ORDER — LOSARTAN POTASSIUM-HCTZ 100-25 MG PO TABS: 1.0000 | ORAL_TABLET | Freq: Every day | ORAL | Status: DC

## 2021-05-05 MED ORDER — LOSARTAN POTASSIUM 50 MG PO TABS
100.0000 mg | ORAL_TABLET | Freq: Every day | ORAL | Status: DC
  Administered 2021-05-05 – 2021-05-06 (×2): 100 mg via ORAL
  Filled 2021-05-05 (×2): qty 2

## 2021-05-05 MED ORDER — HYDROCHLOROTHIAZIDE 25 MG PO TABS
25.0000 mg | ORAL_TABLET | Freq: Every day | ORAL | Status: DC
  Administered 2021-05-05 – 2021-05-06 (×2): 25 mg via ORAL
  Filled 2021-05-05 (×2): qty 1

## 2021-05-05 NOTE — Progress Notes (Signed)
Progress Note   Patient: Martin Hanna HMC:[id number] DOB: 1955/11/06 DOA: 05/03/2021     2 DOS: the patient was seen and examined on 05/05/2021   Brief hospital course: Martin Hanna is a 65 y.o. male with medical history significant of HTN, BPH with LUTS, sleep apnea, hospitalization in November 2021 for sepsis from ESBL E. coli UTI, who presented to the ED on 05/03/2021 with urinary frequency, bilateral flank pain and subjective fevers.  He had been started on Cipro in outpatient setting for suspected UTI but symptoms have not been improving.    ED course: Tmax 99.6, pulse 100 with otherwise normal vitals.  WBC 15,000 with labs otherwise unremarkable.  UA with large leukocyte esterase greater than 50 WBC per hpf and many bacteria.  Started on meropenem given history of ESBL UTI pending urine culture.  Assessment and Plan: * UTI (urinary tract infection)- (present on admission) Continue empiric meropenem given history of ESBL.  Follow urine culture (growing E. coli, susceptibilities pending).  History of ESBL E. coli UTI- (present on admission) Management as outlined above under UTI. On empiric meropenem until cultures result.  Prior infection in November 6629 complicated by bacteremia, required 2-week course IV antibiotics. 2/9: Urine culture growing E. coli, susceptibilities pending -- Blood culture ordered since not done on admission  Benign prostatic hyperplasia with urinary frequency- (present on admission) Continue alfuzosin, dutasteride, methenamine.  Silodosin not on formulary and patient has sulfa allergy, hold Flomax formulary substitution. Monitor for urinary retention  Essential hypertension- (present on admission) Blood pressures were controlled with antihypertensives held on admission.  Home regimen appears to be amlodipine, losartan-HCTZ  2/9: BPs now more elevated --Resume home amlodipine, losartan and HCTZ  Hypercholesteremia- (present on admission) Resume  statin  Chronic chest wall pain- (present on admission) Continue home oxycodone        Subjective: Patient awake seated edge of bed visiting with brother and sister-in-law and seen today.  He reports improvement in his urinary frequency.  He did get up to go a lot overnight however.  He denies fevers or chills.  No other acute complaints at this time.  He does ask if we have checked his bloodstream for infection this time as he did have bacteremia with his last UTI and wants to make sure this does not happen again.  Physical Exam: Vitals:   05/04/21 2020 05/05/21 0547 05/05/21 0814 05/05/21 0814  BP: (!) 149/91 (!) 137/100  (!) 158/91  Pulse: 65 71  73  Resp: 20 20 20    Temp: 97.9 F (36.6 C) 97.7 F (36.5 C) 98 F (36.7 C)   TempSrc: Oral Oral Oral   SpO2: 94% 90%  95%  Weight:      Height:       General exam: awake, alert, no acute distress, obese HEENT: atraumatic, clear conjunctiva, anicteric sclera, moist mucus membranes, hearing grossly normal  Respiratory system: normal respiratory effort, on room air. Cardiovascular system: RRR, no pedal edema.   Central nervous system: A&O x3. no gross focal neurologic deficits, normal speech Extremities: moves all, no edema, normal tone Skin: dry, intact, no rashes, lesions or ulcers seen on visualized skin Psychiatry: normal mood, congruent affect, judgement and insight appear normal   Data Reviewed:  Labs reviewed and notable for glucose 137, calcium 8.5, CBC normal, urine culture growing E. coli with susceptibilities still pending.  Blood culture pending  Family Communication: At bedside on rounds today  Disposition: Status is: Inpatient Remains inpatient appropriate because: Severity  of illness remaining on IV antibiotics pending culture susceptibilities          Planned Discharge Destination: Home     Time spent: 35 minutes  Author: Ezekiel Slocumb, DO 05/05/2021 1:43 PM  For on call review  [email or web address].

## 2021-05-05 NOTE — Plan of Care (Signed)
°  Problem: Clinical Measurements: Goal: Ability to maintain clinical measurements within normal limits will improve Outcome: Progressing Goal: Will remain free from infection Outcome: Progressing Goal: Diagnostic test results will improve Outcome: Progressing Goal: Respiratory complications will improve Outcome: Progressing Goal: Cardiovascular complication will be avoided Outcome: Progressing   Problem: Pain Managment: Goal: General experience of comfort will improve Outcome: Progressing   Problem: Safety: Goal: Ability to remain free from injury will improve Outcome: Progressing   Problem: Education: Goal: Knowledge of General Education information will improve Description: Including pain rating scale, medication(s)/side effects and non-pharmacologic comfort measures Outcome: Progressing   Problem: Health Behavior/Discharge Planning: Goal: Ability to manage health-related needs will improve Outcome: Progressing   Problem: Clinical Measurements: Goal: Ability to maintain clinical measurements within normal limits will improve Outcome: Progressing Goal: Will remain free from infection Outcome: Progressing Goal: Diagnostic test results will improve Outcome: Progressing Goal: Respiratory complications will improve Outcome: Progressing Goal: Cardiovascular complication will be avoided Outcome: Progressing   Problem: Activity: Goal: Risk for activity intolerance will decrease Outcome: Progressing   Problem: Nutrition: Goal: Adequate nutrition will be maintained Outcome: Progressing   Problem: Coping: Goal: Level of anxiety will decrease Outcome: Progressing   Problem: Elimination: Goal: Will not experience complications related to bowel motility Outcome: Progressing Goal: Will not experience complications related to urinary retention Outcome: Progressing   Problem: Pain Managment: Goal: General experience of comfort will improve Outcome: Progressing    Problem: Safety: Goal: Ability to remain free from injury will improve Outcome: Progressing   Problem: Skin Integrity: Goal: Risk for impaired skin integrity will decrease Outcome: Progressing

## 2021-05-06 LAB — GLUCOSE, CAPILLARY
Glucose-Capillary: 135 mg/dL — ABNORMAL HIGH (ref 70–99)
Glucose-Capillary: 201 mg/dL — ABNORMAL HIGH (ref 70–99)

## 2021-05-06 LAB — URINE CULTURE: Culture: 50000 — AB

## 2021-05-06 MED ORDER — NITROFURANTOIN MONOHYD MACRO 100 MG PO CAPS: 100.0000 mg | ORAL_CAPSULE | Freq: Two times a day (BID) | ORAL | 0 refills | Status: AC

## 2021-05-06 NOTE — Plan of Care (Signed)
°  Problem: Clinical Measurements: Goal: Ability to maintain clinical measurements within normal limits will improve Outcome: Progressing Goal: Will remain free from infection Outcome: Progressing Goal: Diagnostic test results will improve Outcome: Progressing Goal: Respiratory complications will improve Outcome: Progressing Goal: Cardiovascular complication will be avoided Outcome: Progressing   Problem: Pain Managment: Goal: General experience of comfort will improve Outcome: Progressing   Problem: Safety: Goal: Ability to remain free from injury will improve Outcome: Progressing   Problem: Education: Goal: Knowledge of General Education information will improve Description: Including pain rating scale, medication(s)/side effects and non-pharmacologic comfort measures Outcome: Progressing   Problem: Health Behavior/Discharge Planning: Goal: Ability to manage health-related needs will improve Outcome: Progressing   Problem: Clinical Measurements: Goal: Ability to maintain clinical measurements within normal limits will improve Outcome: Progressing Goal: Will remain free from infection Outcome: Progressing Goal: Diagnostic test results will improve Outcome: Progressing Goal: Respiratory complications will improve Outcome: Progressing Goal: Cardiovascular complication will be avoided Outcome: Progressing   Problem: Activity: Goal: Risk for activity intolerance will decrease Outcome: Progressing   Problem: Nutrition: Goal: Adequate nutrition will be maintained Outcome: Progressing   Problem: Coping: Goal: Level of anxiety will decrease Outcome: Progressing   Problem: Elimination: Goal: Will not experience complications related to bowel motility Outcome: Progressing Goal: Will not experience complications related to urinary retention Outcome: Progressing   Problem: Pain Managment: Goal: General experience of comfort will improve Outcome: Progressing    Problem: Safety: Goal: Ability to remain free from injury will improve Outcome: Progressing   Problem: Skin Integrity: Goal: Risk for impaired skin integrity will decrease Outcome: Progressing

## 2021-05-06 NOTE — Discharge Summary (Signed)
Physician Discharge Summary   Patient: Martin Hanna MRN: [id number] DOB: 27-Oct-1955  Admit date:     05/03/2021  Discharge date: 05/25/21  Discharge Physician: Ezekiel Slocumb   PCP: Leonides Sake, MD   Recommendations at discharge:    Follow up with urology for prostate issues  Follow up with PCP in 1-2 weeks Repeat CBC/BMP in 1-2 weeks   Discharge Diagnoses: Principal Problem:   UTI (urinary tract infection) Active Problems:   History of ESBL E. coli UTI   Benign prostatic hyperplasia with urinary frequency   Essential hypertension   Chronic chest wall pain   Hypercholesteremia    Hospital Course: Martin Hanna is a 66 y.o. male with medical history significant of HTN, BPH with LUTS, sleep apnea, hospitalization in November 2021 for sepsis from ESBL E. coli UTI, who presented to the ED on 05/03/2021 with urinary frequency, bilateral flank pain and subjective fevers.  He had been started on Cipro in outpatient setting for suspected UTI but symptoms have not been improving.    ED course: Tmax 99.6, pulse 100 with otherwise normal vitals.  WBC 15,000 with labs otherwise unremarkable.  UA with large leukocyte esterase greater than 50 WBC per hpf and many bacteria.  Started on meropenem given history of ESBL UTI pending urine culture.   Urine culture showed susceptibility to Macrobid. Blood cultures obtained and negative to date. Patient is clinically improved and stable for discharge home to complete 4 more days of oral antibiotic.   Recommend urology follow up given significant lower urinary tract issues including infection related to his BPH.   Assessment and Plan: * UTI (urinary tract infection)- (present on admission) Treated with empiric meropenem given history of ESBL.  Urine culture grew ESBL, covered by meropenem.  Transition to Macrobid to complete course.  History of ESBL E. coli UTI- (present on admission) Management as outlined above under  UTI. Treated with empiric meropenem until cultures result.  Prior infection in November 6606 complicated by bacteremia, required 2-week course IV antibiotics. Urine cultures back showing Macrobid sensitive. Complete course with PO Macrobid  Benign prostatic hyperplasia with urinary frequency- (present on admission) Continue home regimen. Monitor for urinary retention. Outpatient urology follow up.  Essential hypertension- (present on admission) Blood pressures were controlled with antihypertensives held on admission.  Home regimen appears to be amlodipine, losartan-HCTZ  Once BP's elevated, resumed on home meds. PCP follow up.  Hypercholesteremia- (present on admission) Resume statin  Chronic chest wall pain- (present on admission) Continue home oxycodone           Consultants: none Procedures performed: none Disposition: Home Diet recommendation:  Discharge Diet Orders (From admission, onward)     Start     Ordered   05/06/21 0000  Diet - low sodium heart healthy        05/06/21 1332           Cardiac diet  DISCHARGE MEDICATION: Allergies as of 05/06/2021       Reactions   Sulfamethoxazole Swelling   Crestor [rosuvastatin Calcium]    Causes muscle cramps   Rosuvastatin Other (See Comments)   Other reaction(s): Cramps (ALLERGY/intolerance) Causes muscle cramps Causes muscle cramps   Rosuvastatin Calcium Other (See Comments)   Causes muscle cramps        Medication List     STOP taking these medications    alfuzosin 10 MG 24 hr tablet Commonly known as: UROXATRAL   cephALEXin 500 MG capsule Commonly known as:  KEFLEX   Paxlovid 20 x 150 MG & 10 x 100MG  Tbpk Generic drug: nirmatrelvir & ritonavir   valsartan-hydrochlorothiazide 160-25 MG tablet Commonly known as: DIOVAN-HCT       TAKE these medications    amLODipine 10 MG tablet Commonly known as: NORVASC Take 10 mg by mouth daily. In the evening   aspirin 81 MG tablet Take 81 mg  by mouth daily.   benzonatate 200 MG capsule Commonly known as: TESSALON Take 200 mg by mouth 3 (three) times daily as needed.   CINNAMON PO Take by mouth.   dutasteride 0.5 MG capsule Commonly known as: AVODART Take 0.5 mg by mouth daily.   Fish Oil 1200 MG Caps Take 1,200 mg by mouth daily.   losartan-hydrochlorothiazide 100-25 MG tablet Commonly known as: HYZAAR Take 1 tablet by mouth daily.   methenamine 1 g tablet Commonly known as: MANDELAMINE SMARTSIG:1 Tablet(s) By Mouth Every 12 Hours   multivitamin with minerals tablet Take 1 tablet by mouth daily.   oxyCODONE 5 MG immediate release tablet Commonly known as: Roxicodone Take 1 tablet (5 mg total) by mouth every 4 (four) hours as needed for severe pain.   rosuvastatin 5 MG tablet Commonly known as: CRESTOR Take 5 mg by mouth once a week.   silodosin 8 MG Caps capsule Commonly known as: RAPAFLO Take 8 mg by mouth daily.       ASK your doctor about these medications    nitrofurantoin (macrocrystal-monohydrate) 100 MG capsule Commonly known as: Macrobid Take 1 capsule (100 mg total) by mouth 2 (two) times daily for 9 doses. Ask about: Should I take this medication?         Discharge Exam: Filed Weights   05/03/21 1931  Weight: 104.3 kg   General exam: awake, alert, no acute distress, obese HEENT: atraumatic, clear conjunctiva, anicteric sclera, moist mucus membranes, hearing grossly normal  Respiratory system: normal respiratory effort, on room air. Cardiovascular system: normal S1/S2, RRR, no pedal edema.   Central nervous system: A&O x3. no gross focal neurologic deficits, normal speech Extremities: moves all, no edema, normal tone Skin: dry, intact, normal temperature Psychiatry: normal mood, congruent affect, judgement and insight appear normal   Condition at discharge: stable  The results of significant diagnostics from this hospitalization (including imaging, microbiology, ancillary  and laboratory) are listed below for reference.   Imaging Studies: CT ABDOMEN PELVIS WO CONTRAST  Result Date: 05/03/2021 CLINICAL DATA:  Urinary retention.  History of UTI.  Flank pain. EXAM: CT ABDOMEN AND PELVIS WITHOUT CONTRAST TECHNIQUE: Multidetector CT imaging of the abdomen and pelvis was performed following the standard protocol without IV contrast. RADIATION DOSE REDUCTION: This exam was performed according to the departmental dose-optimization program which includes automated exposure control, adjustment of the mA and/or kV according to patient size and/or use of iterative reconstruction technique. COMPARISON:  02/06/2020 FINDINGS: Lower chest: Coronary artery and aortic calcifications. No acute abnormality. Hepatobiliary: Prior cholecystectomy.  No focal hepatic abnormality. Pancreas: No focal abnormality or ductal dilatation. Spleen: No focal abnormality.  Normal size. Adrenals/Urinary Tract: No adrenal abnormality. No focal renal abnormality. No stones or hydronephrosis. Urinary bladder is unremarkable. Stomach/Bowel: Diffuse colonic diverticulosis, most pronounced in the left colon. No active diverticulitis. Normal appendix. Stomach and small bowel decompressed, grossly unremarkable. Vascular/Lymphatic: Diffuse aortic atherosclerosis. No evidence of aneurysm or adenopathy. Reproductive: No visible focal abnormality. Other: No free fluid or free air. Musculoskeletal: No acute bony abnormality. IMPRESSION: No renal or ureteral stones.  No hydronephrosis. Diffuse  colonic diverticulosis.  No active diverticulitis. Diffuse aortic atherosclerosis.  Coronary artery disease. No acute findings. Electronically Signed   By: Rolm Baptise M.D.   On: 05/03/2021 23:27    Microbiology: Results for orders placed or performed during the hospital encounter of 05/03/21  Urine Culture     Status: Abnormal   Collection Time: 05/03/21  7:33 PM   Specimen: Urine, Random  Result Value Ref Range Status   Specimen  Description   Final    URINE, RANDOM Performed at [facility], [address] [place], [place] [address]    Special Requests   Final    NONE Performed at [facility], [address] [place], [place] [address]    Culture (A)  Final    50,000 COLONIES/mL ESCHERICHIA COLI Confirmed Extended Spectrum Beta-Lactamase Producer (ESBL).  In bloodstream infections from ESBL organisms, carbapenems are preferred over piperacillin/tazobactam. They are shown to have a lower risk of mortality.    Report Status 05/06/2021 FINAL  Final   Organism ID, Bacteria ESCHERICHIA COLI (A)  Final      Susceptibility   Escherichia coli - MIC*    AMPICILLIN >=32 RESISTANT Resistant     CEFAZOLIN >=64 RESISTANT Resistant     CEFEPIME 2 SENSITIVE Sensitive     CEFTRIAXONE >=64 RESISTANT Resistant     CIPROFLOXACIN >=4 RESISTANT Resistant     GENTAMICIN <=1 SENSITIVE Sensitive     IMIPENEM <=0.25 SENSITIVE Sensitive     NITROFURANTOIN <=16 SENSITIVE Sensitive     TRIMETH/SULFA >=320 RESISTANT Resistant     AMPICILLIN/SULBACTAM 8 SENSITIVE Sensitive     PIP/TAZO <=4 SENSITIVE Sensitive     * 50,000 COLONIES/mL ESCHERICHIA COLI  Resp Panel by RT-PCR (Flu A&B, Covid) Nasopharyngeal Swab     Status: None   Collection Time: 05/04/21  1:15 AM   Specimen: Nasopharyngeal Swab; Nasopharyngeal(NP) swabs in vial transport medium  Result Value Ref Range Status   SARS Coronavirus 2 by RT PCR NEGATIVE NEGATIVE Final    Comment: (NOTE) SARS-CoV-2 target nucleic acids are NOT DETECTED.  The SARS-CoV-2 RNA is generally detectable in upper respiratory specimens during the acute phase of infection. The lowest concentration of SARS-CoV-2 viral copies this assay can detect is 138 copies/mL. A negative result does not preclude SARS-Cov-2 infection and should not be used as the sole basis for treatment or other patient management decisions. A negative result may occur with  improper specimen  collection/handling, submission of specimen other than nasopharyngeal swab, presence of viral mutation(s) within the areas targeted by this assay, and inadequate number of viral copies(<138 copies/mL). A negative result must be combined with clinical observations, patient history, and epidemiological information. The expected result is Negative.  Fact Sheet for Patients:  EntrepreneurPulse.com.au  Fact Sheet for Healthcare Providers:  IncredibleEmployment.be  This test is no t yet approved or cleared by the [place] FDA and  has been authorized for detection and/or diagnosis of SARS-CoV-2 by FDA under an Emergency Use Authorization (EUA). This EUA will remain  in effect (meaning this test can be used) for the duration of the COVID-19 declaration under Section 564(b)(1) of the Act, 21 U.S.C.section 360bbb-3(b)(1), unless the authorization is terminated  or revoked sooner.       Influenza A by PCR NEGATIVE NEGATIVE Final   Influenza B by PCR NEGATIVE NEGATIVE Final    Comment: (NOTE) The Xpert Xpress SARS-CoV-2/FLU/RSV plus assay is intended as an aid in the diagnosis of influenza from Nasopharyngeal swab specimens  and should not be used as a sole basis for treatment. Nasal washings and aspirates are unacceptable for Xpert Xpress SARS-CoV-2/FLU/RSV testing.  Fact Sheet for Patients: EntrepreneurPulse.com.au  Fact Sheet for Healthcare Providers: IncredibleEmployment.be  This test is not yet approved or cleared by the [place] FDA and has been authorized for detection and/or diagnosis of SARS-CoV-2 by FDA under an Emergency Use Authorization (EUA). This EUA will remain in effect (meaning this test can be used) for the duration of the COVID-19 declaration under Section 564(b)(1) of the Act, 21 U.S.C. section 360bbb-3(b)(1), unless the authorization is terminated or revoked.  Performed at [facility], [address] [place], [place] [address]   Culture, blood (single) w Reflex to ID Panel     Status: None   Collection Time: 05/05/21  1:25 PM   Specimen: BLOOD  Result Value Ref Range Status   Specimen Description BLOOD LEFT [facility]  Final   Special Requests   Final    BOTTLES DRAWN AEROBIC AND ANAEROBIC Blood Culture adequate volume   Culture   Final    NO GROWTH 5 DAYS Performed at [facility], [address] [place],  [address]    Report Status 05/10/2021 FINAL  Final    Labs: CBC: No results for input(s): WBC, NEUTROABS, HGB, HCT, MCV, PLT in the last 168 hours.  Basic Metabolic Panel: No results for input(s): NA, K, CL, CO2, GLUCOSE, BUN, CREATININE, CALCIUM, MG, PHOS in the last 168 hours.  Liver Function Tests: No results for input(s): AST, ALT, ALKPHOS, BILITOT, PROT, ALBUMIN in the last 168 hours.  CBG: No results for input(s): GLUCAP in the last 168 hours.   Discharge time spent: less than 30 minutes.  Signed: Ezekiel Slocumb, DO [facility] 05/25/2021

## 2021-05-06 NOTE — Plan of Care (Signed)
Problem: Clinical Measurements: Goal: Ability to maintain clinical measurements within normal limits will improve 05/06/2021 1413 by Adilynne Fitzwater, Camillo Flaming, RN Outcome: Adequate for Discharge 05/06/2021 1343 by Daisy Blossom, RN Outcome: Progressing Goal: Will remain free from infection 05/06/2021 1413 by Buell Parcel, Camillo Flaming, RN Outcome: Adequate for Discharge 05/06/2021 1343 by Daisy Blossom, RN Outcome: Progressing Goal: Diagnostic test results will improve 05/06/2021 1413 by Glady Ouderkirk, Camillo Flaming, RN Outcome: Adequate for Discharge 05/06/2021 1343 by Daisy Blossom, RN Outcome: Progressing Goal: Respiratory complications will improve 05/06/2021 1413 by Graig Hessling, Camillo Flaming, RN Outcome: Adequate for Discharge 05/06/2021 1343 by Daisy Blossom, RN Outcome: Progressing Goal: Cardiovascular complication will be avoided 05/06/2021 1413 by Cully Luckow, Camillo Flaming, RN Outcome: Adequate for Discharge 05/06/2021 1343 by Daisy Blossom, RN Outcome: Progressing   Problem: Pain Managment: Goal: General experience of comfort will improve 05/06/2021 1413 by Shanta Hartner, Camillo Flaming, RN Outcome: Adequate for Discharge 05/06/2021 1343 by Daisy Blossom, RN Outcome: Progressing   Problem: Safety: Goal: Ability to remain free from injury will improve 05/06/2021 1413 by Faruq Rosenberger, Camillo Flaming, RN Outcome: Adequate for Discharge 05/06/2021 1343 by Helene Bernstein, Camillo Flaming, RN Outcome: Progressing   Problem: Education: Goal: Knowledge of General Education information will improve Description: Including pain rating scale, medication(s)/side effects and non-pharmacologic comfort measures 05/06/2021 1413 by Jailin Moomaw, Camillo Flaming, RN Outcome: Adequate for Discharge 05/06/2021 1343 by Daisy Blossom, RN Outcome: Progressing   Problem: Health Behavior/Discharge Planning: Goal: Ability to manage health-related needs will improve 05/06/2021 1413 by Imre Vecchione, Camillo Flaming, RN Outcome: Adequate for  Discharge 05/06/2021 1343 by Daisy Blossom, RN Outcome: Progressing   Problem: Clinical Measurements: Goal: Ability to maintain clinical measurements within normal limits will improve 05/06/2021 1413 by Yumna Ebers, Camillo Flaming, RN Outcome: Adequate for Discharge 05/06/2021 1343 by Daisy Blossom, RN Outcome: Progressing Goal: Will remain free from infection 05/06/2021 1413 by Chick Cousins, Camillo Flaming, RN Outcome: Adequate for Discharge 05/06/2021 1343 by Daisy Blossom, RN Outcome: Progressing Goal: Diagnostic test results will improve 05/06/2021 1413 by Kensley Lares, Camillo Flaming, RN Outcome: Adequate for Discharge 05/06/2021 1343 by Daisy Blossom, RN Outcome: Progressing Goal: Respiratory complications will improve 05/06/2021 1413 by Josephine Rudnick, Camillo Flaming, RN Outcome: Adequate for Discharge 05/06/2021 1343 by Daisy Blossom, RN Outcome: Progressing Goal: Cardiovascular complication will be avoided 05/06/2021 1413 by Khyre Germond, Camillo Flaming, RN Outcome: Adequate for Discharge 05/06/2021 1343 by Reason Helzer, Camillo Flaming, RN Outcome: Progressing   Problem: Activity: Goal: Risk for activity intolerance will decrease 05/06/2021 1413 by Sohan Potvin, Camillo Flaming, RN Outcome: Adequate for Discharge 05/06/2021 1343 by Daisy Blossom, RN Outcome: Progressing   Problem: Nutrition: Goal: Adequate nutrition will be maintained 05/06/2021 1413 by Jenia Klepper, Camillo Flaming, RN Outcome: Adequate for Discharge 05/06/2021 1343 by Daisy Blossom, RN Outcome: Progressing   Problem: Coping: Goal: Level of anxiety will decrease 05/06/2021 1413 by Doral Digangi, Camillo Flaming, RN Outcome: Adequate for Discharge 05/06/2021 1343 by Daisy Blossom, RN Outcome: Progressing   Problem: Elimination: Goal: Will not experience complications related to bowel motility 05/06/2021 1413 by Jennifier Smitherman, Camillo Flaming, RN Outcome: Adequate for Discharge 05/06/2021 1343 by Daisy Blossom, RN Outcome: Progressing Goal: Will not experience  complications related to urinary retention 05/06/2021 1413 by Sylvana Bonk, Camillo Flaming, RN Outcome: Adequate for Discharge 05/06/2021 1343 by HuskinsCamillo Flaming, RN Outcome: Progressing   Problem: Pain Managment: Goal: General experience of comfort will improve 05/06/2021 1413 by Savannha Welle, Camillo Flaming, RN Outcome: Adequate for Discharge 05/06/2021 1343 by Mark Hassey,  Camillo Flaming, RN Outcome: Progressing   Problem: Safety: Goal: Ability to remain free from injury will improve 05/06/2021 1413 by Breianna Delfino, Camillo Flaming, RN Outcome: Adequate for Discharge 05/06/2021 1343 by Daisy Blossom, RN Outcome: Progressing   Problem: Skin Integrity: Goal: Risk for impaired skin integrity will decrease 05/06/2021 1413 by Mylie Mccurley, Camillo Flaming, RN Outcome: Adequate for Discharge 05/06/2021 1343 by Zavian Slowey, Camillo Flaming, RN Outcome: Progressing

## 2021-05-10 LAB — CULTURE, BLOOD (SINGLE)
Culture: NO GROWTH
Special Requests: ADEQUATE

## 2021-05-17 DIAGNOSIS — N390 Urinary tract infection, site not specified: Secondary | ICD-10-CM | POA: Diagnosis not present

## 2021-05-18 DIAGNOSIS — N10 Acute tubulo-interstitial nephritis: Secondary | ICD-10-CM | POA: Diagnosis not present

## 2021-05-18 DIAGNOSIS — N390 Urinary tract infection, site not specified: Secondary | ICD-10-CM | POA: Diagnosis not present

## 2021-06-14 DIAGNOSIS — H6982 Other specified disorders of Eustachian tube, left ear: Secondary | ICD-10-CM | POA: Diagnosis not present

## 2021-06-15 DIAGNOSIS — R351 Nocturia: Secondary | ICD-10-CM | POA: Diagnosis not present

## 2021-06-15 DIAGNOSIS — N401 Enlarged prostate with lower urinary tract symptoms: Secondary | ICD-10-CM | POA: Diagnosis not present

## 2021-06-15 DIAGNOSIS — N10 Acute tubulo-interstitial nephritis: Secondary | ICD-10-CM | POA: Diagnosis not present

## 2021-08-30 DIAGNOSIS — I700 Atherosclerosis of aorta: Secondary | ICD-10-CM | POA: Diagnosis not present

## 2021-08-30 DIAGNOSIS — E7800 Pure hypercholesterolemia, unspecified: Secondary | ICD-10-CM | POA: Diagnosis not present

## 2021-08-30 DIAGNOSIS — I10 Essential (primary) hypertension: Secondary | ICD-10-CM | POA: Diagnosis not present

## 2021-08-30 DIAGNOSIS — E785 Hyperlipidemia, unspecified: Secondary | ICD-10-CM | POA: Diagnosis not present

## 2021-08-30 DIAGNOSIS — G8929 Other chronic pain: Secondary | ICD-10-CM | POA: Diagnosis not present

## 2021-08-30 DIAGNOSIS — R0789 Other chest pain: Secondary | ICD-10-CM | POA: Diagnosis not present

## 2021-08-30 DIAGNOSIS — Z125 Encounter for screening for malignant neoplasm of prostate: Secondary | ICD-10-CM | POA: Diagnosis not present

## 2021-08-30 DIAGNOSIS — E1169 Type 2 diabetes mellitus with other specified complication: Secondary | ICD-10-CM | POA: Diagnosis not present

## 2021-09-22 DIAGNOSIS — H524 Presbyopia: Secondary | ICD-10-CM | POA: Diagnosis not present

## 2021-11-08 DIAGNOSIS — H35371 Puckering of macula, right eye: Secondary | ICD-10-CM | POA: Diagnosis not present

## 2022-01-03 DIAGNOSIS — E7800 Pure hypercholesterolemia, unspecified: Secondary | ICD-10-CM | POA: Diagnosis not present

## 2022-01-03 DIAGNOSIS — E1169 Type 2 diabetes mellitus with other specified complication: Secondary | ICD-10-CM | POA: Diagnosis not present

## 2022-01-03 DIAGNOSIS — R635 Abnormal weight gain: Secondary | ICD-10-CM | POA: Diagnosis not present

## 2022-02-06 DIAGNOSIS — R059 Cough, unspecified: Secondary | ICD-10-CM | POA: Diagnosis not present

## 2022-03-31 DIAGNOSIS — R7303 Prediabetes: Secondary | ICD-10-CM | POA: Diagnosis not present

## 2022-03-31 DIAGNOSIS — H04122 Dry eye syndrome of left lacrimal gland: Secondary | ICD-10-CM | POA: Diagnosis not present

## 2022-03-31 DIAGNOSIS — H35372 Puckering of macula, left eye: Secondary | ICD-10-CM | POA: Diagnosis not present

## 2022-03-31 DIAGNOSIS — H2513 Age-related nuclear cataract, bilateral: Secondary | ICD-10-CM | POA: Diagnosis not present

## 2022-04-14 DIAGNOSIS — M79604 Pain in right leg: Secondary | ICD-10-CM | POA: Diagnosis not present

## 2022-04-18 DIAGNOSIS — E1169 Type 2 diabetes mellitus with other specified complication: Secondary | ICD-10-CM | POA: Diagnosis not present

## 2022-04-18 DIAGNOSIS — M5416 Radiculopathy, lumbar region: Secondary | ICD-10-CM | POA: Diagnosis not present

## 2022-04-18 DIAGNOSIS — E785 Hyperlipidemia, unspecified: Secondary | ICD-10-CM | POA: Diagnosis not present

## 2022-05-16 DIAGNOSIS — E1169 Type 2 diabetes mellitus with other specified complication: Secondary | ICD-10-CM | POA: Diagnosis not present

## 2022-05-16 DIAGNOSIS — Z1331 Encounter for screening for depression: Secondary | ICD-10-CM | POA: Diagnosis not present

## 2022-05-16 DIAGNOSIS — I700 Atherosclerosis of aorta: Secondary | ICD-10-CM | POA: Diagnosis not present

## 2022-05-16 DIAGNOSIS — I10 Essential (primary) hypertension: Secondary | ICD-10-CM | POA: Diagnosis not present

## 2022-05-16 DIAGNOSIS — E7800 Pure hypercholesterolemia, unspecified: Secondary | ICD-10-CM | POA: Diagnosis not present

## 2022-05-16 DIAGNOSIS — R0789 Other chest pain: Secondary | ICD-10-CM | POA: Diagnosis not present

## 2022-05-16 DIAGNOSIS — Z139 Encounter for screening, unspecified: Secondary | ICD-10-CM | POA: Diagnosis not present

## 2022-05-16 DIAGNOSIS — E785 Hyperlipidemia, unspecified: Secondary | ICD-10-CM | POA: Diagnosis not present

## 2022-05-16 DIAGNOSIS — Z9181 History of falling: Secondary | ICD-10-CM | POA: Diagnosis not present

## 2022-05-16 DIAGNOSIS — Z23 Encounter for immunization: Secondary | ICD-10-CM | POA: Diagnosis not present

## 2022-05-16 DIAGNOSIS — G8929 Other chronic pain: Secondary | ICD-10-CM | POA: Diagnosis not present

## 2022-06-19 DIAGNOSIS — M1712 Unilateral primary osteoarthritis, left knee: Secondary | ICD-10-CM | POA: Diagnosis not present

## 2022-06-22 DIAGNOSIS — E559 Vitamin D deficiency, unspecified: Secondary | ICD-10-CM | POA: Diagnosis not present

## 2022-06-22 DIAGNOSIS — M79603 Pain in arm, unspecified: Secondary | ICD-10-CM | POA: Diagnosis not present

## 2022-06-22 DIAGNOSIS — Z79899 Other long term (current) drug therapy: Secondary | ICD-10-CM | POA: Diagnosis not present

## 2022-06-22 DIAGNOSIS — Z01818 Encounter for other preprocedural examination: Secondary | ICD-10-CM | POA: Diagnosis not present

## 2022-06-27 DIAGNOSIS — H04123 Dry eye syndrome of bilateral lacrimal glands: Secondary | ICD-10-CM | POA: Diagnosis not present

## 2022-06-27 DIAGNOSIS — H35373 Puckering of macula, bilateral: Secondary | ICD-10-CM | POA: Diagnosis not present

## 2022-06-27 DIAGNOSIS — H2513 Age-related nuclear cataract, bilateral: Secondary | ICD-10-CM | POA: Diagnosis not present

## 2022-07-13 DIAGNOSIS — I10 Essential (primary) hypertension: Secondary | ICD-10-CM | POA: Diagnosis not present

## 2022-07-13 DIAGNOSIS — G8918 Other acute postprocedural pain: Secondary | ICD-10-CM | POA: Diagnosis not present

## 2022-07-13 DIAGNOSIS — Z471 Aftercare following joint replacement surgery: Secondary | ICD-10-CM | POA: Diagnosis not present

## 2022-07-13 DIAGNOSIS — M1712 Unilateral primary osteoarthritis, left knee: Secondary | ICD-10-CM | POA: Diagnosis not present

## 2022-07-13 DIAGNOSIS — Z96652 Presence of left artificial knee joint: Secondary | ICD-10-CM | POA: Diagnosis not present

## 2022-07-15 DIAGNOSIS — N400 Enlarged prostate without lower urinary tract symptoms: Secondary | ICD-10-CM | POA: Diagnosis not present

## 2022-07-15 DIAGNOSIS — Z7982 Long term (current) use of aspirin: Secondary | ICD-10-CM | POA: Diagnosis not present

## 2022-07-15 DIAGNOSIS — E1142 Type 2 diabetes mellitus with diabetic polyneuropathy: Secondary | ICD-10-CM | POA: Diagnosis not present

## 2022-07-15 DIAGNOSIS — G4733 Obstructive sleep apnea (adult) (pediatric): Secondary | ICD-10-CM | POA: Diagnosis not present

## 2022-07-15 DIAGNOSIS — Z7984 Long term (current) use of oral hypoglycemic drugs: Secondary | ICD-10-CM | POA: Diagnosis not present

## 2022-07-15 DIAGNOSIS — I700 Atherosclerosis of aorta: Secondary | ICD-10-CM | POA: Diagnosis not present

## 2022-07-15 DIAGNOSIS — E7800 Pure hypercholesterolemia, unspecified: Secondary | ICD-10-CM | POA: Diagnosis not present

## 2022-07-15 DIAGNOSIS — E1169 Type 2 diabetes mellitus with other specified complication: Secondary | ICD-10-CM | POA: Diagnosis not present

## 2022-07-15 DIAGNOSIS — Z6838 Body mass index (BMI) 38.0-38.9, adult: Secondary | ICD-10-CM | POA: Diagnosis not present

## 2022-07-15 DIAGNOSIS — Z9181 History of falling: Secondary | ICD-10-CM | POA: Diagnosis not present

## 2022-07-15 DIAGNOSIS — Z96652 Presence of left artificial knee joint: Secondary | ICD-10-CM | POA: Diagnosis not present

## 2022-07-15 DIAGNOSIS — I10 Essential (primary) hypertension: Secondary | ICD-10-CM | POA: Diagnosis not present

## 2022-07-15 DIAGNOSIS — M5416 Radiculopathy, lumbar region: Secondary | ICD-10-CM | POA: Diagnosis not present

## 2022-07-15 DIAGNOSIS — Z471 Aftercare following joint replacement surgery: Secondary | ICD-10-CM | POA: Diagnosis not present

## 2022-07-15 DIAGNOSIS — G8929 Other chronic pain: Secondary | ICD-10-CM | POA: Diagnosis not present

## 2022-07-15 DIAGNOSIS — M67471 Ganglion, right ankle and foot: Secondary | ICD-10-CM | POA: Diagnosis not present

## 2022-07-16 DIAGNOSIS — N400 Enlarged prostate without lower urinary tract symptoms: Secondary | ICD-10-CM | POA: Diagnosis not present

## 2022-07-16 DIAGNOSIS — Z7984 Long term (current) use of oral hypoglycemic drugs: Secondary | ICD-10-CM | POA: Diagnosis not present

## 2022-07-16 DIAGNOSIS — M5416 Radiculopathy, lumbar region: Secondary | ICD-10-CM | POA: Diagnosis not present

## 2022-07-16 DIAGNOSIS — G4733 Obstructive sleep apnea (adult) (pediatric): Secondary | ICD-10-CM | POA: Diagnosis not present

## 2022-07-16 DIAGNOSIS — I10 Essential (primary) hypertension: Secondary | ICD-10-CM | POA: Diagnosis not present

## 2022-07-16 DIAGNOSIS — E1142 Type 2 diabetes mellitus with diabetic polyneuropathy: Secondary | ICD-10-CM | POA: Diagnosis not present

## 2022-07-16 DIAGNOSIS — Z471 Aftercare following joint replacement surgery: Secondary | ICD-10-CM | POA: Diagnosis not present

## 2022-07-16 DIAGNOSIS — Z9181 History of falling: Secondary | ICD-10-CM | POA: Diagnosis not present

## 2022-07-16 DIAGNOSIS — G8929 Other chronic pain: Secondary | ICD-10-CM | POA: Diagnosis not present

## 2022-07-16 DIAGNOSIS — Z7982 Long term (current) use of aspirin: Secondary | ICD-10-CM | POA: Diagnosis not present

## 2022-07-16 DIAGNOSIS — M67471 Ganglion, right ankle and foot: Secondary | ICD-10-CM | POA: Diagnosis not present

## 2022-07-16 DIAGNOSIS — Z6838 Body mass index (BMI) 38.0-38.9, adult: Secondary | ICD-10-CM | POA: Diagnosis not present

## 2022-07-16 DIAGNOSIS — E1169 Type 2 diabetes mellitus with other specified complication: Secondary | ICD-10-CM | POA: Diagnosis not present

## 2022-07-16 DIAGNOSIS — I700 Atherosclerosis of aorta: Secondary | ICD-10-CM | POA: Diagnosis not present

## 2022-07-16 DIAGNOSIS — Z96652 Presence of left artificial knee joint: Secondary | ICD-10-CM | POA: Diagnosis not present

## 2022-07-16 DIAGNOSIS — E7800 Pure hypercholesterolemia, unspecified: Secondary | ICD-10-CM | POA: Diagnosis not present

## 2022-07-17 DIAGNOSIS — G4733 Obstructive sleep apnea (adult) (pediatric): Secondary | ICD-10-CM | POA: Diagnosis not present

## 2022-07-17 DIAGNOSIS — Z7984 Long term (current) use of oral hypoglycemic drugs: Secondary | ICD-10-CM | POA: Diagnosis not present

## 2022-07-17 DIAGNOSIS — N400 Enlarged prostate without lower urinary tract symptoms: Secondary | ICD-10-CM | POA: Diagnosis not present

## 2022-07-17 DIAGNOSIS — I10 Essential (primary) hypertension: Secondary | ICD-10-CM | POA: Diagnosis not present

## 2022-07-17 DIAGNOSIS — Z471 Aftercare following joint replacement surgery: Secondary | ICD-10-CM | POA: Diagnosis not present

## 2022-07-17 DIAGNOSIS — M5416 Radiculopathy, lumbar region: Secondary | ICD-10-CM | POA: Diagnosis not present

## 2022-07-17 DIAGNOSIS — E1142 Type 2 diabetes mellitus with diabetic polyneuropathy: Secondary | ICD-10-CM | POA: Diagnosis not present

## 2022-07-17 DIAGNOSIS — Z6838 Body mass index (BMI) 38.0-38.9, adult: Secondary | ICD-10-CM | POA: Diagnosis not present

## 2022-07-17 DIAGNOSIS — E1169 Type 2 diabetes mellitus with other specified complication: Secondary | ICD-10-CM | POA: Diagnosis not present

## 2022-07-17 DIAGNOSIS — I700 Atherosclerosis of aorta: Secondary | ICD-10-CM | POA: Diagnosis not present

## 2022-07-17 DIAGNOSIS — G8929 Other chronic pain: Secondary | ICD-10-CM | POA: Diagnosis not present

## 2022-07-17 DIAGNOSIS — Z9181 History of falling: Secondary | ICD-10-CM | POA: Diagnosis not present

## 2022-07-17 DIAGNOSIS — M67471 Ganglion, right ankle and foot: Secondary | ICD-10-CM | POA: Diagnosis not present

## 2022-07-17 DIAGNOSIS — Z96652 Presence of left artificial knee joint: Secondary | ICD-10-CM | POA: Diagnosis not present

## 2022-07-17 DIAGNOSIS — Z7982 Long term (current) use of aspirin: Secondary | ICD-10-CM | POA: Diagnosis not present

## 2022-07-17 DIAGNOSIS — E7800 Pure hypercholesterolemia, unspecified: Secondary | ICD-10-CM | POA: Diagnosis not present

## 2022-07-19 DIAGNOSIS — I10 Essential (primary) hypertension: Secondary | ICD-10-CM | POA: Diagnosis not present

## 2022-07-19 DIAGNOSIS — Z471 Aftercare following joint replacement surgery: Secondary | ICD-10-CM | POA: Diagnosis not present

## 2022-07-19 DIAGNOSIS — I700 Atherosclerosis of aorta: Secondary | ICD-10-CM | POA: Diagnosis not present

## 2022-07-19 DIAGNOSIS — G8929 Other chronic pain: Secondary | ICD-10-CM | POA: Diagnosis not present

## 2022-07-19 DIAGNOSIS — Z7984 Long term (current) use of oral hypoglycemic drugs: Secondary | ICD-10-CM | POA: Diagnosis not present

## 2022-07-19 DIAGNOSIS — E7800 Pure hypercholesterolemia, unspecified: Secondary | ICD-10-CM | POA: Diagnosis not present

## 2022-07-19 DIAGNOSIS — Z96652 Presence of left artificial knee joint: Secondary | ICD-10-CM | POA: Diagnosis not present

## 2022-07-19 DIAGNOSIS — Z7982 Long term (current) use of aspirin: Secondary | ICD-10-CM | POA: Diagnosis not present

## 2022-07-19 DIAGNOSIS — Z6838 Body mass index (BMI) 38.0-38.9, adult: Secondary | ICD-10-CM | POA: Diagnosis not present

## 2022-07-19 DIAGNOSIS — N400 Enlarged prostate without lower urinary tract symptoms: Secondary | ICD-10-CM | POA: Diagnosis not present

## 2022-07-19 DIAGNOSIS — G4733 Obstructive sleep apnea (adult) (pediatric): Secondary | ICD-10-CM | POA: Diagnosis not present

## 2022-07-19 DIAGNOSIS — E1142 Type 2 diabetes mellitus with diabetic polyneuropathy: Secondary | ICD-10-CM | POA: Diagnosis not present

## 2022-07-19 DIAGNOSIS — M67471 Ganglion, right ankle and foot: Secondary | ICD-10-CM | POA: Diagnosis not present

## 2022-07-19 DIAGNOSIS — E1169 Type 2 diabetes mellitus with other specified complication: Secondary | ICD-10-CM | POA: Diagnosis not present

## 2022-07-19 DIAGNOSIS — M5416 Radiculopathy, lumbar region: Secondary | ICD-10-CM | POA: Diagnosis not present

## 2022-07-19 DIAGNOSIS — Z9181 History of falling: Secondary | ICD-10-CM | POA: Diagnosis not present

## 2022-07-21 DIAGNOSIS — I10 Essential (primary) hypertension: Secondary | ICD-10-CM | POA: Diagnosis not present

## 2022-07-21 DIAGNOSIS — N400 Enlarged prostate without lower urinary tract symptoms: Secondary | ICD-10-CM | POA: Diagnosis not present

## 2022-07-21 DIAGNOSIS — Z471 Aftercare following joint replacement surgery: Secondary | ICD-10-CM | POA: Diagnosis not present

## 2022-07-21 DIAGNOSIS — E7800 Pure hypercholesterolemia, unspecified: Secondary | ICD-10-CM | POA: Diagnosis not present

## 2022-07-21 DIAGNOSIS — E1169 Type 2 diabetes mellitus with other specified complication: Secondary | ICD-10-CM | POA: Diagnosis not present

## 2022-07-21 DIAGNOSIS — M5416 Radiculopathy, lumbar region: Secondary | ICD-10-CM | POA: Diagnosis not present

## 2022-07-21 DIAGNOSIS — I700 Atherosclerosis of aorta: Secondary | ICD-10-CM | POA: Diagnosis not present

## 2022-07-21 DIAGNOSIS — E1142 Type 2 diabetes mellitus with diabetic polyneuropathy: Secondary | ICD-10-CM | POA: Diagnosis not present

## 2022-07-21 DIAGNOSIS — Z7982 Long term (current) use of aspirin: Secondary | ICD-10-CM | POA: Diagnosis not present

## 2022-07-21 DIAGNOSIS — Z9181 History of falling: Secondary | ICD-10-CM | POA: Diagnosis not present

## 2022-07-21 DIAGNOSIS — M67471 Ganglion, right ankle and foot: Secondary | ICD-10-CM | POA: Diagnosis not present

## 2022-07-21 DIAGNOSIS — Z96652 Presence of left artificial knee joint: Secondary | ICD-10-CM | POA: Diagnosis not present

## 2022-07-21 DIAGNOSIS — G8929 Other chronic pain: Secondary | ICD-10-CM | POA: Diagnosis not present

## 2022-07-21 DIAGNOSIS — G4733 Obstructive sleep apnea (adult) (pediatric): Secondary | ICD-10-CM | POA: Diagnosis not present

## 2022-07-21 DIAGNOSIS — Z7984 Long term (current) use of oral hypoglycemic drugs: Secondary | ICD-10-CM | POA: Diagnosis not present

## 2022-07-21 DIAGNOSIS — Z6838 Body mass index (BMI) 38.0-38.9, adult: Secondary | ICD-10-CM | POA: Diagnosis not present

## 2022-07-24 DIAGNOSIS — E7800 Pure hypercholesterolemia, unspecified: Secondary | ICD-10-CM | POA: Diagnosis not present

## 2022-07-24 DIAGNOSIS — N400 Enlarged prostate without lower urinary tract symptoms: Secondary | ICD-10-CM | POA: Diagnosis not present

## 2022-07-24 DIAGNOSIS — Z96652 Presence of left artificial knee joint: Secondary | ICD-10-CM | POA: Diagnosis not present

## 2022-07-24 DIAGNOSIS — I700 Atherosclerosis of aorta: Secondary | ICD-10-CM | POA: Diagnosis not present

## 2022-07-24 DIAGNOSIS — Z7982 Long term (current) use of aspirin: Secondary | ICD-10-CM | POA: Diagnosis not present

## 2022-07-24 DIAGNOSIS — Z7984 Long term (current) use of oral hypoglycemic drugs: Secondary | ICD-10-CM | POA: Diagnosis not present

## 2022-07-24 DIAGNOSIS — Z6838 Body mass index (BMI) 38.0-38.9, adult: Secondary | ICD-10-CM | POA: Diagnosis not present

## 2022-07-24 DIAGNOSIS — G8929 Other chronic pain: Secondary | ICD-10-CM | POA: Diagnosis not present

## 2022-07-24 DIAGNOSIS — Z9181 History of falling: Secondary | ICD-10-CM | POA: Diagnosis not present

## 2022-07-24 DIAGNOSIS — M67471 Ganglion, right ankle and foot: Secondary | ICD-10-CM | POA: Diagnosis not present

## 2022-07-24 DIAGNOSIS — I10 Essential (primary) hypertension: Secondary | ICD-10-CM | POA: Diagnosis not present

## 2022-07-24 DIAGNOSIS — Z471 Aftercare following joint replacement surgery: Secondary | ICD-10-CM | POA: Diagnosis not present

## 2022-07-24 DIAGNOSIS — M5416 Radiculopathy, lumbar region: Secondary | ICD-10-CM | POA: Diagnosis not present

## 2022-07-24 DIAGNOSIS — E1169 Type 2 diabetes mellitus with other specified complication: Secondary | ICD-10-CM | POA: Diagnosis not present

## 2022-07-24 DIAGNOSIS — G4733 Obstructive sleep apnea (adult) (pediatric): Secondary | ICD-10-CM | POA: Diagnosis not present

## 2022-07-24 DIAGNOSIS — E1142 Type 2 diabetes mellitus with diabetic polyneuropathy: Secondary | ICD-10-CM | POA: Diagnosis not present

## 2022-07-27 DIAGNOSIS — M25462 Effusion, left knee: Secondary | ICD-10-CM | POA: Diagnosis not present

## 2022-07-27 DIAGNOSIS — M25662 Stiffness of left knee, not elsewhere classified: Secondary | ICD-10-CM | POA: Diagnosis not present

## 2022-07-27 DIAGNOSIS — M25562 Pain in left knee: Secondary | ICD-10-CM | POA: Diagnosis not present

## 2022-07-27 DIAGNOSIS — R2689 Other abnormalities of gait and mobility: Secondary | ICD-10-CM | POA: Diagnosis not present

## 2022-07-31 DIAGNOSIS — N390 Urinary tract infection, site not specified: Secondary | ICD-10-CM | POA: Diagnosis not present

## 2022-08-01 DIAGNOSIS — M25662 Stiffness of left knee, not elsewhere classified: Secondary | ICD-10-CM | POA: Diagnosis not present

## 2022-08-01 DIAGNOSIS — R2689 Other abnormalities of gait and mobility: Secondary | ICD-10-CM | POA: Diagnosis not present

## 2022-08-01 DIAGNOSIS — M25462 Effusion, left knee: Secondary | ICD-10-CM | POA: Diagnosis not present

## 2022-08-01 DIAGNOSIS — M25562 Pain in left knee: Secondary | ICD-10-CM | POA: Diagnosis not present

## 2022-08-04 DIAGNOSIS — M25662 Stiffness of left knee, not elsewhere classified: Secondary | ICD-10-CM | POA: Diagnosis not present

## 2022-08-04 DIAGNOSIS — M25562 Pain in left knee: Secondary | ICD-10-CM | POA: Diagnosis not present

## 2022-08-04 DIAGNOSIS — M25462 Effusion, left knee: Secondary | ICD-10-CM | POA: Diagnosis not present

## 2022-08-04 DIAGNOSIS — R2689 Other abnormalities of gait and mobility: Secondary | ICD-10-CM | POA: Diagnosis not present

## 2022-08-08 DIAGNOSIS — M25562 Pain in left knee: Secondary | ICD-10-CM | POA: Diagnosis not present

## 2022-08-08 DIAGNOSIS — N390 Urinary tract infection, site not specified: Secondary | ICD-10-CM | POA: Diagnosis not present

## 2022-08-08 DIAGNOSIS — M25662 Stiffness of left knee, not elsewhere classified: Secondary | ICD-10-CM | POA: Diagnosis not present

## 2022-08-08 DIAGNOSIS — R2689 Other abnormalities of gait and mobility: Secondary | ICD-10-CM | POA: Diagnosis not present

## 2022-08-08 DIAGNOSIS — M25462 Effusion, left knee: Secondary | ICD-10-CM | POA: Diagnosis not present

## 2022-08-11 DIAGNOSIS — M25662 Stiffness of left knee, not elsewhere classified: Secondary | ICD-10-CM | POA: Diagnosis not present

## 2022-08-11 DIAGNOSIS — M25462 Effusion, left knee: Secondary | ICD-10-CM | POA: Diagnosis not present

## 2022-08-11 DIAGNOSIS — N390 Urinary tract infection, site not specified: Secondary | ICD-10-CM | POA: Diagnosis not present

## 2022-08-11 DIAGNOSIS — R2689 Other abnormalities of gait and mobility: Secondary | ICD-10-CM | POA: Diagnosis not present

## 2022-08-11 DIAGNOSIS — M25562 Pain in left knee: Secondary | ICD-10-CM | POA: Diagnosis not present

## 2022-08-15 DIAGNOSIS — M25662 Stiffness of left knee, not elsewhere classified: Secondary | ICD-10-CM | POA: Diagnosis not present

## 2022-08-15 DIAGNOSIS — M25562 Pain in left knee: Secondary | ICD-10-CM | POA: Diagnosis not present

## 2022-08-15 DIAGNOSIS — R2689 Other abnormalities of gait and mobility: Secondary | ICD-10-CM | POA: Diagnosis not present

## 2022-08-15 DIAGNOSIS — M25462 Effusion, left knee: Secondary | ICD-10-CM | POA: Diagnosis not present

## 2022-08-18 DIAGNOSIS — M25562 Pain in left knee: Secondary | ICD-10-CM | POA: Diagnosis not present

## 2022-08-18 DIAGNOSIS — M25662 Stiffness of left knee, not elsewhere classified: Secondary | ICD-10-CM | POA: Diagnosis not present

## 2022-08-18 DIAGNOSIS — M25462 Effusion, left knee: Secondary | ICD-10-CM | POA: Diagnosis not present

## 2022-08-18 DIAGNOSIS — R2689 Other abnormalities of gait and mobility: Secondary | ICD-10-CM | POA: Diagnosis not present

## 2022-08-23 DIAGNOSIS — M25662 Stiffness of left knee, not elsewhere classified: Secondary | ICD-10-CM | POA: Diagnosis not present

## 2022-08-23 DIAGNOSIS — M25462 Effusion, left knee: Secondary | ICD-10-CM | POA: Diagnosis not present

## 2022-08-23 DIAGNOSIS — R2689 Other abnormalities of gait and mobility: Secondary | ICD-10-CM | POA: Diagnosis not present

## 2022-08-23 DIAGNOSIS — M25562 Pain in left knee: Secondary | ICD-10-CM | POA: Diagnosis not present

## 2022-08-25 DIAGNOSIS — M25662 Stiffness of left knee, not elsewhere classified: Secondary | ICD-10-CM | POA: Diagnosis not present

## 2022-08-25 DIAGNOSIS — M25462 Effusion, left knee: Secondary | ICD-10-CM | POA: Diagnosis not present

## 2022-08-25 DIAGNOSIS — R2689 Other abnormalities of gait and mobility: Secondary | ICD-10-CM | POA: Diagnosis not present

## 2022-08-25 DIAGNOSIS — M25562 Pain in left knee: Secondary | ICD-10-CM | POA: Diagnosis not present

## 2022-08-29 DIAGNOSIS — M25662 Stiffness of left knee, not elsewhere classified: Secondary | ICD-10-CM | POA: Diagnosis not present

## 2022-08-29 DIAGNOSIS — M25562 Pain in left knee: Secondary | ICD-10-CM | POA: Diagnosis not present

## 2022-08-29 DIAGNOSIS — M25462 Effusion, left knee: Secondary | ICD-10-CM | POA: Diagnosis not present

## 2022-08-29 DIAGNOSIS — R2689 Other abnormalities of gait and mobility: Secondary | ICD-10-CM | POA: Diagnosis not present

## 2022-09-01 DIAGNOSIS — R2689 Other abnormalities of gait and mobility: Secondary | ICD-10-CM | POA: Diagnosis not present

## 2022-09-01 DIAGNOSIS — M25562 Pain in left knee: Secondary | ICD-10-CM | POA: Diagnosis not present

## 2022-09-01 DIAGNOSIS — M25462 Effusion, left knee: Secondary | ICD-10-CM | POA: Diagnosis not present

## 2022-09-01 DIAGNOSIS — M25662 Stiffness of left knee, not elsewhere classified: Secondary | ICD-10-CM | POA: Diagnosis not present

## 2022-09-04 DIAGNOSIS — M25562 Pain in left knee: Secondary | ICD-10-CM | POA: Diagnosis not present

## 2022-09-04 DIAGNOSIS — R2689 Other abnormalities of gait and mobility: Secondary | ICD-10-CM | POA: Diagnosis not present

## 2022-09-04 DIAGNOSIS — M25462 Effusion, left knee: Secondary | ICD-10-CM | POA: Diagnosis not present

## 2022-09-04 DIAGNOSIS — M25662 Stiffness of left knee, not elsewhere classified: Secondary | ICD-10-CM | POA: Diagnosis not present

## 2022-09-06 DIAGNOSIS — R2689 Other abnormalities of gait and mobility: Secondary | ICD-10-CM | POA: Diagnosis not present

## 2022-09-06 DIAGNOSIS — M25562 Pain in left knee: Secondary | ICD-10-CM | POA: Diagnosis not present

## 2022-09-06 DIAGNOSIS — M25462 Effusion, left knee: Secondary | ICD-10-CM | POA: Diagnosis not present

## 2022-09-06 DIAGNOSIS — M25662 Stiffness of left knee, not elsewhere classified: Secondary | ICD-10-CM | POA: Diagnosis not present

## 2022-09-13 DIAGNOSIS — M25662 Stiffness of left knee, not elsewhere classified: Secondary | ICD-10-CM | POA: Diagnosis not present

## 2022-09-13 DIAGNOSIS — R2689 Other abnormalities of gait and mobility: Secondary | ICD-10-CM | POA: Diagnosis not present

## 2022-09-13 DIAGNOSIS — M25462 Effusion, left knee: Secondary | ICD-10-CM | POA: Diagnosis not present

## 2022-09-13 DIAGNOSIS — M25562 Pain in left knee: Secondary | ICD-10-CM | POA: Diagnosis not present

## 2022-09-15 DIAGNOSIS — M25462 Effusion, left knee: Secondary | ICD-10-CM | POA: Diagnosis not present

## 2022-09-15 DIAGNOSIS — R2689 Other abnormalities of gait and mobility: Secondary | ICD-10-CM | POA: Diagnosis not present

## 2022-09-15 DIAGNOSIS — M25562 Pain in left knee: Secondary | ICD-10-CM | POA: Diagnosis not present

## 2022-09-15 DIAGNOSIS — M25662 Stiffness of left knee, not elsewhere classified: Secondary | ICD-10-CM | POA: Diagnosis not present

## 2022-09-19 DIAGNOSIS — N400 Enlarged prostate without lower urinary tract symptoms: Secondary | ICD-10-CM | POA: Diagnosis not present

## 2022-09-19 DIAGNOSIS — E7800 Pure hypercholesterolemia, unspecified: Secondary | ICD-10-CM | POA: Diagnosis not present

## 2022-09-19 DIAGNOSIS — R2689 Other abnormalities of gait and mobility: Secondary | ICD-10-CM | POA: Diagnosis not present

## 2022-09-19 DIAGNOSIS — Z125 Encounter for screening for malignant neoplasm of prostate: Secondary | ICD-10-CM | POA: Diagnosis not present

## 2022-09-19 DIAGNOSIS — R0789 Other chest pain: Secondary | ICD-10-CM | POA: Diagnosis not present

## 2022-09-19 DIAGNOSIS — E785 Hyperlipidemia, unspecified: Secondary | ICD-10-CM | POA: Diagnosis not present

## 2022-09-19 DIAGNOSIS — M25662 Stiffness of left knee, not elsewhere classified: Secondary | ICD-10-CM | POA: Diagnosis not present

## 2022-09-19 DIAGNOSIS — E1169 Type 2 diabetes mellitus with other specified complication: Secondary | ICD-10-CM | POA: Diagnosis not present

## 2022-09-19 DIAGNOSIS — I700 Atherosclerosis of aorta: Secondary | ICD-10-CM | POA: Diagnosis not present

## 2022-09-19 DIAGNOSIS — I10 Essential (primary) hypertension: Secondary | ICD-10-CM | POA: Diagnosis not present

## 2022-09-19 DIAGNOSIS — M25562 Pain in left knee: Secondary | ICD-10-CM | POA: Diagnosis not present

## 2022-09-19 DIAGNOSIS — G8929 Other chronic pain: Secondary | ICD-10-CM | POA: Diagnosis not present

## 2022-09-19 DIAGNOSIS — M25462 Effusion, left knee: Secondary | ICD-10-CM | POA: Diagnosis not present

## 2022-09-19 DIAGNOSIS — Z79899 Other long term (current) drug therapy: Secondary | ICD-10-CM | POA: Diagnosis not present

## 2022-09-22 DIAGNOSIS — M25462 Effusion, left knee: Secondary | ICD-10-CM | POA: Diagnosis not present

## 2022-09-22 DIAGNOSIS — M25562 Pain in left knee: Secondary | ICD-10-CM | POA: Diagnosis not present

## 2022-09-22 DIAGNOSIS — R2689 Other abnormalities of gait and mobility: Secondary | ICD-10-CM | POA: Diagnosis not present

## 2022-09-22 DIAGNOSIS — M25662 Stiffness of left knee, not elsewhere classified: Secondary | ICD-10-CM | POA: Diagnosis not present

## 2022-10-02 DIAGNOSIS — M25562 Pain in left knee: Secondary | ICD-10-CM | POA: Diagnosis not present

## 2022-10-02 DIAGNOSIS — M25662 Stiffness of left knee, not elsewhere classified: Secondary | ICD-10-CM | POA: Diagnosis not present

## 2022-10-02 DIAGNOSIS — M25462 Effusion, left knee: Secondary | ICD-10-CM | POA: Diagnosis not present

## 2022-10-02 DIAGNOSIS — R2689 Other abnormalities of gait and mobility: Secondary | ICD-10-CM | POA: Diagnosis not present

## 2022-11-23 DIAGNOSIS — R339 Retention of urine, unspecified: Secondary | ICD-10-CM | POA: Diagnosis not present

## 2022-11-23 DIAGNOSIS — R0609 Other forms of dyspnea: Secondary | ICD-10-CM | POA: Diagnosis not present

## 2022-12-01 DIAGNOSIS — N401 Enlarged prostate with lower urinary tract symptoms: Secondary | ICD-10-CM | POA: Diagnosis not present

## 2022-12-01 DIAGNOSIS — R350 Frequency of micturition: Secondary | ICD-10-CM | POA: Diagnosis not present

## 2022-12-01 DIAGNOSIS — R3915 Urgency of urination: Secondary | ICD-10-CM | POA: Diagnosis not present

## 2022-12-13 DIAGNOSIS — K219 Gastro-esophageal reflux disease without esophagitis: Secondary | ICD-10-CM | POA: Diagnosis not present

## 2022-12-13 DIAGNOSIS — E785 Hyperlipidemia, unspecified: Secondary | ICD-10-CM | POA: Diagnosis not present

## 2022-12-13 DIAGNOSIS — E1169 Type 2 diabetes mellitus with other specified complication: Secondary | ICD-10-CM | POA: Diagnosis not present

## 2022-12-13 DIAGNOSIS — R0789 Other chest pain: Secondary | ICD-10-CM | POA: Diagnosis not present

## 2022-12-21 DIAGNOSIS — R0600 Dyspnea, unspecified: Secondary | ICD-10-CM | POA: Diagnosis not present

## 2022-12-21 DIAGNOSIS — R0789 Other chest pain: Secondary | ICD-10-CM | POA: Diagnosis not present

## 2022-12-28 DIAGNOSIS — I517 Cardiomegaly: Secondary | ICD-10-CM | POA: Diagnosis not present

## 2022-12-28 DIAGNOSIS — R0609 Other forms of dyspnea: Secondary | ICD-10-CM | POA: Diagnosis not present

## 2022-12-29 DIAGNOSIS — R350 Frequency of micturition: Secondary | ICD-10-CM | POA: Diagnosis not present

## 2022-12-29 DIAGNOSIS — R3915 Urgency of urination: Secondary | ICD-10-CM | POA: Diagnosis not present

## 2023-01-03 DIAGNOSIS — R350 Frequency of micturition: Secondary | ICD-10-CM | POA: Diagnosis not present

## 2023-01-03 DIAGNOSIS — R3912 Poor urinary stream: Secondary | ICD-10-CM | POA: Diagnosis not present

## 2023-01-03 DIAGNOSIS — N401 Enlarged prostate with lower urinary tract symptoms: Secondary | ICD-10-CM | POA: Diagnosis not present

## 2023-01-03 DIAGNOSIS — R351 Nocturia: Secondary | ICD-10-CM | POA: Diagnosis not present

## 2023-01-05 ENCOUNTER — Other Ambulatory Visit: Payer: Self-pay | Admitting: Urology

## 2023-01-09 DIAGNOSIS — M1712 Unilateral primary osteoarthritis, left knee: Secondary | ICD-10-CM | POA: Diagnosis not present

## 2023-01-25 DIAGNOSIS — Z79899 Other long term (current) drug therapy: Secondary | ICD-10-CM | POA: Diagnosis not present

## 2023-01-25 DIAGNOSIS — E785 Hyperlipidemia, unspecified: Secondary | ICD-10-CM | POA: Diagnosis not present

## 2023-01-25 DIAGNOSIS — E1169 Type 2 diabetes mellitus with other specified complication: Secondary | ICD-10-CM | POA: Diagnosis not present

## 2023-01-25 DIAGNOSIS — Z23 Encounter for immunization: Secondary | ICD-10-CM | POA: Diagnosis not present

## 2023-01-25 DIAGNOSIS — N400 Enlarged prostate without lower urinary tract symptoms: Secondary | ICD-10-CM | POA: Diagnosis not present

## 2023-01-25 DIAGNOSIS — R0789 Other chest pain: Secondary | ICD-10-CM | POA: Diagnosis not present

## 2023-01-25 DIAGNOSIS — I10 Essential (primary) hypertension: Secondary | ICD-10-CM | POA: Diagnosis not present

## 2023-01-25 DIAGNOSIS — G8929 Other chronic pain: Secondary | ICD-10-CM | POA: Diagnosis not present

## 2023-01-25 DIAGNOSIS — E7800 Pure hypercholesterolemia, unspecified: Secondary | ICD-10-CM | POA: Diagnosis not present

## 2023-01-25 DIAGNOSIS — I700 Atherosclerosis of aorta: Secondary | ICD-10-CM | POA: Diagnosis not present

## 2023-02-11 NOTE — Progress Notes (Signed)
COVID Vaccine received:  []  No [x]  Yes Date of any COVID positive Test in last 90 days:  PCP - Monia Sabal, MD  Cardiologist - Jonetta Osgood, MD   Chest x-ray -02-06-2020  2v  Epic  EKG -  06-22-2022 Epic Stress Test -  ECHO - 12-28-2022  Epic scan- [facility] at [facility] Cardiac [facility] -   PCR screen: []  Ordered & Completed []   No Order but Needs PROFEND     [x]   N/A for this surgery  Surgery Plan:  []  Ambulatory   [x]  Outpatient in bed  []  Admit Anesthesia:    [x]  General  []  Spinal  []   Choice []   MAC  Bowel Prep - [x]  No  []   Yes ______  Pacemaker / ICD device [x]  No []  Yes   Spinal Cord Stimulator:[x]  No []  Yes       History of Sleep Apnea? []  No [x]  Yes   CPAP used?- []  No []  Yes    Does the patient monitor blood sugar?   [x]  N/A   []  No []  Yes  Patient has: [x]  NO Hx DM   []  Pre-DM   []  DM1  []   DM2 Last A1c was: 6.0  on  02-11-2019     Does patient have a [employer] or Dexacom? []  No []  Yes   Fasting Blood Sugar Ranges-  Checks Blood Sugar _____ times a day  Blood Thinner / Instructions:  None Aspirin Instructions:  ASA 81mg    already on hold  ERAS Protocol Ordered: [x]  No  []  Yes Patient is to be NPO after: Midnight prior  Dental hx: []  Dentures:  []  N/A      []  Bridge or Partial:                   []  Loose or Damaged teeth:   Comments:   Activity level: Patient is able / unable to climb a flight of stairs without difficulty; []  No CP  []  No SOB, but would have ___   Patient can / can not perform ADLs without assistance.   Anesthesia review: HTN, GERD, hx vertigo, remote hx seizure- last 50 yrs ago, OSA-    , Hx ESBL e. Coli UTI,   Pre-DM.  Patient denies shortness of breath, fever, cough and chest pain at PAT appointment.  Patient verbalized understanding and agreement to the Pre-Surgical Instructions that were given to them at this PAT appointment. Patient was also educated of the need to review these PAT instructions again prior to his surgery.I reviewed  the appropriate phone numbers to call if they have any and questions or concerns.

## 2023-02-11 NOTE — Patient Instructions (Signed)
SURGICAL WAITING ROOM VISITATION Patients having surgery or a procedure may have no more than 2 support people in the waiting area - these visitors may rotate.    Children under the age of 29 must have an adult with them who is not the patient.  If the patient needs to stay at the hospital during part of their recovery, the visitor guidelines for inpatient rooms apply. Pre-op nurse will coordinate an appropriate time for 1 support person to accompany patient in pre-op.  This support person may not rotate.    Please refer to the [facility] website for the visitor guidelines for Inpatients (after your surgery is over and you are in a regular room).       Your procedure is scheduled on:  02-20-23   Report to [facility] Main Entrance    Report to admitting at 5:15 AM   Call this number if you have problems the morning of surgery [phone]   Do not eat food or drink liquids :After Midnight.           If you have questions, please contact your surgeon's office.   FOLLOW ANY ADDITIONAL PRE OP INSTRUCTIONS YOU RECEIVED FROM YOUR SURGEON'S OFFICE!!!     Oral Hygiene is also important to reduce your risk of infection.                                    Remember - BRUSH YOUR TEETH THE MORNING OF SURGERY WITH YOUR REGULAR TOOTHPASTE   Do NOT smoke after Midnight   Take these medicines the morning of surgery with A SIP OF WATER:  Omeprazole Dutasteride (Avodart),  Silodosin (Rapaflo) Methenamine Carvedilol (Coreg) Gabapentin Amlodipine  You may take Tylenol if needed for pain,  Stop all vitamins and herbal supplements 7 days before surgery                              You may not have any metal on your body including jewelry, and body piercing             Do not wear  lotions, powders, cologne, or deodorant              Men may shave face and neck.   Do not bring valuables to the hospital. [facility] IS NOT RESPONSIBLE   FOR VALUABLES.   Contacts, dentures or  bridgework may not be worn into surgery.   Bring small overnight bag day of surgery.   DO NOT BRING YOUR HOME MEDICATIONS TO THE HOSPITAL. PHARMACY WILL DISPENSE MEDICATIONS LISTED ON YOUR MEDICATION LIST TO YOU DURING YOUR ADMISSION IN THE HOSPITAL!    Special Instructions: Bring a copy of your healthcare power of attorney and living will documents the day of surgery if you haven't scanned them before.              Please read over the following fact sheets you were given: IF YOU HAVE QUESTIONS ABOUT YOUR PRE-OP INSTRUCTIONS PLEASE CALL [phone] [place]  If you received a COVID test during your pre-op visit  it is requested that you wear a mask when out in public, stay away from anyone that may not be feeling well and notify your surgeon if you develop symptoms. If you test positive for Covid or have been in contact with anyone that has tested positive in the last  10 days please notify you surgeon.  [place] - Preparing for Surgery Before surgery, you can play an important role.  Because skin is not sterile, your skin needs to be as free of germs as possible.  You can reduce the number of germs on your skin by washing with CHG (chlorahexidine gluconate) soap before surgery.  CHG is an antiseptic cleaner which kills germs and bonds with the skin to continue killing germs even after washing. Please DO NOT use if you have an allergy to CHG or antibacterial soaps.  If your skin becomes reddened/irritated stop using the CHG and inform your nurse when you arrive at Short Stay. Do not shave (including legs and underarms) for at least 48 hours prior to the first CHG shower.  You may shave your face/neck.  Please follow these instructions carefully:  1.  Shower with CHG Soap the night before surgery and the  morning of surgery.  2.  If you choose to wash your hair, wash your hair first as usual with your normal  shampoo.  3.  After you shampoo, rinse your hair and body thoroughly to remove the  shampoo.                             4.  Use CHG as you would any other liquid soap.  You can apply chg directly to the skin and wash.  Gently with a scrungie or clean washcloth.  5.  Apply the CHG Soap to your body ONLY FROM THE NECK DOWN.   Do   not use on face/ open                           Wound or open sores. Avoid contact with eyes, ears mouth and   genitals (private parts).                       Wash face,  Genitals (private parts) with your normal soap.             6.  Wash thoroughly, paying special attention to the area where your    surgery  will be performed.  7.  Thoroughly rinse your body with warm water from the neck down.  8.  DO NOT shower/wash with your normal soap after using and rinsing off the CHG Soap.                9.  Pat yourself dry with a clean towel.            10.  Wear clean pajamas.            11.  Place clean sheets on your bed the night of your first shower and do not  sleep with pets. Day of Surgery : Do not apply any lotions/deodorants the morning of surgery.  Please wear clean clothes to the hospital/surgery center.  FAILURE TO FOLLOW THESE INSTRUCTIONS MAY RESULT IN THE CANCELLATION OF YOUR SURGERY  PATIENT SIGNATURE_________________________________  NURSE SIGNATURE__________________________________  ________________________________________________________________________

## 2023-02-12 ENCOUNTER — Other Ambulatory Visit: Payer: Self-pay

## 2023-02-12 ENCOUNTER — Encounter (HOSPITAL_COMMUNITY)
Admission: RE | Admit: 2023-02-12 | Discharge: 2023-02-12 | Disposition: A | Discharge status: Home / Self Care | Payer: Medicare HMO | Source: Ambulatory Visit | Attending: Urology | Admitting: Urology

## 2023-02-12 ENCOUNTER — Encounter (HOSPITAL_COMMUNITY): Payer: Self-pay

## 2023-02-12 VITALS — BP 147/84 | HR 74 | Temp 98.4°F | Resp 20 | Ht 65.0 in | Wt 233.8 lb

## 2023-02-12 DIAGNOSIS — Z01812 Encounter for preprocedural laboratory examination: Secondary | ICD-10-CM | POA: Diagnosis not present

## 2023-02-12 DIAGNOSIS — R7303 Prediabetes: Secondary | ICD-10-CM | POA: Diagnosis not present

## 2023-02-12 DIAGNOSIS — R0989 Other specified symptoms and signs involving the circulatory and respiratory systems: Secondary | ICD-10-CM

## 2023-02-12 DIAGNOSIS — Z01818 Encounter for other preprocedural examination: Secondary | ICD-10-CM

## 2023-02-12 DIAGNOSIS — I10 Essential (primary) hypertension: Secondary | ICD-10-CM | POA: Diagnosis not present

## 2023-02-12 HISTORY — DX: Nausea with vomiting, unspecified: R11.2

## 2023-02-12 HISTORY — DX: Prediabetes: R73.03

## 2023-02-12 HISTORY — DX: Other specified postprocedural states: Z98.890

## 2023-02-12 LAB — CBC
HCT: 46.3 % (ref 39.0–52.0)
Hemoglobin: 15.5 g/dL (ref 13.0–17.0)
MCH: 29.4 pg (ref 26.0–34.0)
MCHC: 33.5 g/dL (ref 30.0–36.0)
MCV: 87.9 fL (ref 80.0–100.0)
Platelets: 194 10*3/uL (ref 150–400)
RBC: 5.27 MIL/uL (ref 4.22–5.81)
RDW: 12.9 % (ref 11.5–15.5)
WBC: 7.6 10*3/uL (ref 4.0–10.5)
nRBC: 0 % (ref 0.0–0.2)

## 2023-02-12 LAB — BASIC METABOLIC PANEL
Anion gap: 12 (ref 5–15)
BUN: 20 mg/dL (ref 8–23)
CO2: 26 mmol/L (ref 22–32)
Calcium: 8.6 mg/dL — ABNORMAL LOW (ref 8.9–10.3)
Chloride: 100 mmol/L (ref 98–111)
Creatinine, Ser: 0.91 mg/dL (ref 0.61–1.24)
GFR, Estimated: >60 mL/min (ref >60)
Glucose, Bld: 233 mg/dL — ABNORMAL HIGH (ref 70–99)
Potassium: 3.8 mmol/L (ref 3.5–5.1)
Sodium: 138 mmol/L (ref 135–145)

## 2023-02-12 LAB — HEMOGLOBIN A1C
Hgb A1c MFr Bld: 8.2 % — ABNORMAL HIGH (ref 4.8–5.6)
Mean Plasma Glucose: 188.64 mg/dL

## 2023-02-12 NOTE — Progress Notes (Addendum)
COVID Vaccine Completed:  Date of COVID positive in last 90 days:  No  PCP - Burnell Blanks, MD (office note requested) Cardiologist - N/A  Chest x-ray - N/A EKG - 06-22-22 Epic Stress Test - Yes, several years ago ECHO - 12-28-22 Epic Cardiac Cath - N/A Pacemaker/ICD device last checked: Spinal Cord Stimulator: N/A  Bowel Prep - N/A  Sleep Study - Yes, +sleep apnea CPAP - No, unable to tolerate CPAP  Prediabetic Fasting Blood Sugar - 130 to 140 Checks Blood Sugar - checks occasionally  Last dose of GLP1 agonist-  N/A GLP1 instructions:  Hold 7 days before surgery    Last dose of SGLT-2 inhibitors-  N/A SGLT-2 instructions:  Hold 3 days before surgery   Blood Thinner Instructions:  Time Aspirin Instructions:  ASA 81 Last Dose: stopped a week ago  Activity level:  Can go up a flight of stairs and perform activities of daily living without stopping and without symptoms of chest pain or shortness of breath.  Anesthesia review:  Patient states that he was having some chest discomfort and shortness of breath in October, and PCP sent him for an Echo (results in Epic).  States that those symptoms have resolved,    Seizures - last 50 years ago  Glucose 233 on PAT labs, A1c added to labs.  A1c 8.2 on PAT labs, patient diagnosed as prediabetic.  Patient denies shortness of breath, fever, cough and chest pain at PAT appointment  Patient verbalized understanding of instructions that were given to them at the PAT appointment. Patient was also instructed that they will need to review over the PAT instructions again at home before surgery.

## 2023-02-13 ENCOUNTER — Encounter (HOSPITAL_COMMUNITY): Payer: Self-pay

## 2023-02-19 NOTE — Anesthesia Preprocedure Evaluation (Signed)
Anesthesia Evaluation  Patient identified by MRN, date of birth, ID band Patient awake    Reviewed: Allergy & Precautions, NPO status , Patient's Chart, lab work & pertinent test results  History of Anesthesia Complications (+) PONV and history of anesthetic complications  Airway Mallampati: III  TM Distance: >3 FB Neck ROM: Full   Comment: Previous grade III view with MAC blade Dental  (+) Dental Advisory Given, Poor Dentition, Missing   Pulmonary neg shortness of breath, sleep apnea (doesn't tolerate CPAP) , neg COPD, neg recent URI   Pulmonary exam normal breath sounds clear to auscultation       Cardiovascular hypertension (amlodipine, carvedilol, losartan-HCTZ), Pt. on medications and Pt. on home beta blockers (-) angina (-) Past MI, (-) Cardiac Stents and (-) CABG (-) dysrhythmias  Rhythm:Regular Rate:Normal  HLD   Neuro/Psych Seizures - (x4 at 67 yo), Well Controlled,  BPPV  Neuromuscular disease (neuropathy)    GI/Hepatic Neg liver ROS,GERD  Medicated,,  Endo/Other  diabetes (Hgb A1c 8.2), Poorly Controlled  Pre-diabetes  Renal/GU negative Renal ROS   BPH with bladder outlet obstruction    Musculoskeletal   Abdominal  (+) + obese  Peds  Hematology negative hematology ROS (+) Lab Results      Component                Value               Date                      WBC                      7.6                 02/12/2023                HGB                      15.5                02/12/2023                HCT                      46.3                02/12/2023                MCV                      87.9                02/12/2023                PLT                      194                 02/12/2023              Anesthesia Other Findings   Reproductive/Obstetrics                             Anesthesia Physical Anesthesia Plan  ASA: 3  Anesthesia Plan: General   Post-op Pain  Management: Tylenol PO (pre-op)*   Induction: Intravenous  PONV Risk  Score and Plan: 3 and Ondansetron, Dexamethasone and Treatment may vary due to age or medical condition  Airway Management Planned: LMA  Additional Equipment:   Intra-op Plan:   Post-operative Plan: Extubation in OR  Informed Consent: I have reviewed the patients History and Physical, chart, labs and discussed the procedure including the risks, benefits and alternatives for the proposed anesthesia with the patient or authorized representative who has indicated his/her understanding and acceptance.     Dental advisory given  Plan Discussed with: CRNA and Anesthesiologist  Anesthesia Plan Comments: (Risks of general anesthesia discussed including, but not limited to, sore throat, hoarse voice, chipped/damaged teeth, injury to vocal cords, nausea and vomiting, allergic reactions, lung infection, heart attack, stroke, and death. All questions answered. )        Anesthesia Quick Evaluation

## 2023-02-19 NOTE — H&P (Signed)
I have symptoms of an enlarged prostate.     10/9/24[name] returns today in f/u for cystoscopy following urodynamics as noted below. He has a small capacity unstable bladder with BOO. He remains on silodosin, dutasteride and nitrofurantoin for UTI suppression.   Martin Hanna held a max capacity of approx. 278 mls. His 1st sensation was felt at 89 mls. There was positive instability. Multiple unstable contractions noted on the study. He felt an increased urgency but did not leak or void off these contractions. He was able to generate a voluntary contraction and void 29 mls with max flow of 4 ml/s. Max detrusor pressure while voiding was 60 cmH20. Increased EMG activity was noted during voiding. PVR was approx. 249 mls. Trabeculation and some elevation of the bladder base was noted. No reflux was seen.   9/6/24[name] returns today to reestablish care for his BPH with BOO. He remains on silodosin 8mg . He was started on dutasteride a month ago but his prostate is small. He has frequency q1hr during the day and he will have urgency with some UUI. He has some hesitancy. He has a reduced stream with some intermittency. He has no dysuria or hematuria. His PVR is 55ml. He was started on augmentin about 5 days ago by Dr. Nathanial Rancher for a UTI. His UA is clear today. He had malodorous urine for a week prior to starting the antibiotics but that has resolved. he had a CT in 2023 and his prostate was about 31ml. He has had several cultures in the past with e. coli and had a recent culture but I don't have those results.      ALLERGIES: SULFA - Anaphylaxis, tongue swells    MEDICATIONS: Amlodipine Besylate 10 mg tablet  Amoxicillin-Clavulanate Potass  Aspir 81  Carvedilol Er 20 mg capsule,extended release multiphase 24hr  Cinnamon  Dutasteride  Fish Oil  Gabapentin 300 mg capsule 1 capsule PO Daily  Losartan-Hydrochlorothiazide 100 mg-25 mg tablet 1 tablet PO Daily  Methenamine  Multivitamin  Nitrofurantoin 50 mg  capsule 1 capsule PO Q HS  Norvasc 10 mg tablet 1 tablet PO Daily  Rosuvastatin Calcium 5 mg tablet  Silodosin 8 mg capsule  Vitamin C     GU PSH: Complex cystometrogram, w/ void pressure and urethral pressure profile studies, any technique - 12/29/2022 Complex Uroflow - 12/29/2022 Cystoscopy - 2021 Emg surf Electrd - 12/29/2022 Inject For cystogram - 12/29/2022 Intrabd voidng Press - 12/29/2022     NON-GU PSH: Appendectomy (open) Cholecystectomy (open) Inguinal Hernia Repair > 5 yrs Lumbar Laminectomy - about 2002 Remove Nose Polyp(s) Visit Complexity (formerly GPC1X) - 12/01/2022     GU PMH: Urinary Frequency - 12/29/2022, - 12/01/2022, - 2022, Symptoms a bit better back on alpha-blocker-silodosin, - 2022, - 2021 Urinary Urgency - 12/29/2022, - 12/01/2022, - 2022, - 2021 BPH w/LUTS, He has progressive LUTS on silodosin without benefit from dutasteride yet. I will have him return for urodynamics and then cystoscopy. - 12/01/2022, Symptoms doing well on Rapaflo , - 11/09/2021, - 2023, - 2023, - 2022, His symptoms did get a bit worse after I took him off of alfuzosin at last visit. He is now on silodosin and dutasteride. He did not have a large prostate cystoscopically so I do not think he needs to be on the dutasteride., - 2022, - 2021 (Worsening), He has significant lower urinary tract symptomatology-I think that this is most likely due to BPH, - 2017 Chronic cystitis (w/o hematuria), He had a recent flair  of his UTI's. I will have him start nitrofurantoin 50mg  nightly pending further evaluation. His prior e. coli in 2023 was sensitive to that, but I will get his recent culture from Dr. Nathanial Rancher. - 12/01/2022 Nocturia - 12/01/2022, - 2023 (Stable), - 2021, I think that a lot of his nocturia is due to increased fluid intake in the afternoon and evening, - 2017 Pyelonephritis, History of recurrent UTI, fairly quiescent last 5 months. He is on meth Enomine - 11/09/2021, - 2023, Recurrent ESBL E. coli infection  with recent hospitalization. Still with pyuria and symptoms, - 2023, - 2022, He did have a recurrent urinary tract infection after I last saw him. I do not have records of his most recent urinalysis/ culture but will send for these., - 2022, History of pyelonephritis with, by patient history, several urinary tract infections over the past 2 years. Currently, his most recent episode of pyelonephritis has been treated with IV antibiotics, his urine is clear as. He does not have an obstructive prostate., - 2021, - 2021    NON-GU PMH: Sepsis due to Escherichia coli [E. coli] - 2021 Arthritis GERD Hypercholesterolemia Hypertension Sleep Apnea    FAMILY HISTORY: Blockage of coronary artery of heart - Sister Death In The Family Father - Father Glaucoma - Mother Heart Attack - Father Kidney Stones - Brother Prostate Cancer - Father Uterine Cancer - Sister   SOCIAL HISTORY: Marital Status: Married Preferred Language: English; Ethnicity: Not Hispanic Or Latino; Race: White Current Smoking Status: Patient has never smoked.  Has never drank.  Drinks 2 caffeinated drinks per day. Patient's occupation Furniture conservator/restorer.[employer]    REVIEW OF SYSTEMS:    GU Review Male:   Patient reports trouble starting your stream. Patient denies frequent urination, hard to postpone urination, burning/ pain with urination, get up at night to urinate, leakage of urine, stream starts and stops, have to strain to urinate , erection problems, and penile pain.  Gastrointestinal (Upper):   Patient denies nausea, vomiting, and indigestion/ heartburn.  Gastrointestinal (Lower):   Patient denies diarrhea and constipation.  Constitutional:   Patient denies fever, night sweats, weight loss, and fatigue.  Skin:   Patient denies skin rash/ lesion and itching.  Eyes:   Patient denies blurred vision and double vision.  Ears/ Nose/ Throat:   Patient denies sore throat and sinus problems.  Hematologic/Lymphatic:   Patient denies  swollen glands and easy bruising.  Cardiovascular:   Patient denies leg swelling and chest pains.  Respiratory:   Patient denies cough and shortness of breath.  Endocrine:   Patient denies excessive thirst.  Musculoskeletal:   Patient denies back pain and joint pain.  Neurological:   Patient denies headaches and dizziness.  Psychologic:   Patient denies depression and anxiety.   Notes: Urgency, dribbling    VITAL SIGNS: None   Complexity of Data:  Urodynamics Review:   Review Urodynamics Tests   04/19/20 12/05/14 02/15/13  PSA  Total PSA 0.8 ng/ml 0.8 ng/dl 0.8 ng/dl    PROCEDURES:         Flexible Cystoscopy - 52000  Risks, benefits, and some of the potential complications of the procedure were discussed. He was prepped with betadine and the urethral was instilled with 2% lidocaine jelly. On NF suppression   Meatus:  Normal size. Normal location. Normal condition.  Urethra:  No strictures.  External Sphincter:  Normal.  Verumontanum:  Normal.  Prostate:  Obstructing. Mild hyperplasia.  Bladder Neck:  Non-obstructing.  Ureteral Orifices:  Normal location. Normal size. Normal shape. Effluxed clear urine.  Bladder:  Mild trabeculation. No tumors. Normal mucosa. No stones.      The procedure was well tolerated and there were no complications.         Urinalysis Dipstick Dipstick Cont'd  Color: Yellow Bilirubin: Neg mg/dL  Appearance: Clear Ketones: Neg mg/dL  Specific Gravity: 6.295 Blood: Neg ery/uL  pH: 6.0 Protein: Neg mg/dL  Glucose: Neg mg/dL Urobilinogen: 0.2 mg/dL    Nitrites: Neg    Leukocyte Esterase: Neg leu/uL    ASSESSMENT:      ICD-10 Details  1 GU:   BPH w/LUTS - N40.1 Chronic, Stable - He has BPH with BOO with DI and persistent symptoms on dutasteride and silodosin. I am reluctant to add an OAB med with the obstruction. I have discussed surgical management and after reviewing the options I will get him set up for a TURP. He will stay on the nitrofurantoin  for suppresion in the interim.   I reviewd the risks of a TURP including bleeding, infection, incontinence, stricture, need for secondary procedures, ejaculatory and erectile dysfunction, thrombotic events, fluid overload and anesthetic complications. I explained that 95% of men will have relief of the obstructive symptoms and about 70% will have relief of the irritative symptoms.    2   Nocturia - R35.1 Chronic, Stable  3   Urinary Frequency - R35.0 Chronic, Stable  4   Weak Urinary Stream - R39.12 Chronic, Stable     PLAN:           Schedule Return Visit/Planned Activity: Next Available Appointment - Schedule Surgery  Procedure: Unspecified Date - Cystoscopy TURP - 28413 Notes: Next available.           Document

## 2023-02-20 ENCOUNTER — Ambulatory Visit (HOSPITAL_COMMUNITY): Payer: Self-pay | Admitting: Anesthesiology

## 2023-02-20 ENCOUNTER — Encounter (HOSPITAL_COMMUNITY)
Admission: RE | Disposition: A | Discharge status: Home / Self Care | Payer: Self-pay | Source: Home / Self Care | Attending: Urology

## 2023-02-20 ENCOUNTER — Other Ambulatory Visit: Payer: Self-pay

## 2023-02-20 ENCOUNTER — Encounter (HOSPITAL_COMMUNITY): Payer: Self-pay | Admitting: Urology

## 2023-02-20 ENCOUNTER — Ambulatory Visit (HOSPITAL_COMMUNITY): Payer: Medicare HMO | Admitting: Medical

## 2023-02-20 ENCOUNTER — Observation Stay (HOSPITAL_COMMUNITY)
Admission: RE | Admit: 2023-02-20 | Discharge: 2023-02-21 | Disposition: A | Discharge status: Home / Self Care | Payer: Medicare HMO | Attending: Urology | Admitting: Urology

## 2023-02-20 DIAGNOSIS — I10 Essential (primary) hypertension: Secondary | ICD-10-CM | POA: Insufficient documentation

## 2023-02-20 DIAGNOSIS — N401 Enlarged prostate with lower urinary tract symptoms: Secondary | ICD-10-CM

## 2023-02-20 DIAGNOSIS — R350 Frequency of micturition: Secondary | ICD-10-CM | POA: Diagnosis not present

## 2023-02-20 DIAGNOSIS — N320 Bladder-neck obstruction: Secondary | ICD-10-CM | POA: Diagnosis not present

## 2023-02-20 DIAGNOSIS — Z7982 Long term (current) use of aspirin: Secondary | ICD-10-CM | POA: Diagnosis not present

## 2023-02-20 DIAGNOSIS — N400 Enlarged prostate without lower urinary tract symptoms: Secondary | ICD-10-CM | POA: Diagnosis not present

## 2023-02-20 DIAGNOSIS — R3912 Poor urinary stream: Secondary | ICD-10-CM | POA: Insufficient documentation

## 2023-02-20 DIAGNOSIS — R351 Nocturia: Secondary | ICD-10-CM | POA: Insufficient documentation

## 2023-02-20 DIAGNOSIS — Z79899 Other long term (current) drug therapy: Secondary | ICD-10-CM | POA: Insufficient documentation

## 2023-02-20 DIAGNOSIS — N138 Other obstructive and reflux uropathy: Principal | ICD-10-CM | POA: Diagnosis present

## 2023-02-20 DIAGNOSIS — E7800 Pure hypercholesterolemia, unspecified: Secondary | ICD-10-CM | POA: Diagnosis not present

## 2023-02-20 HISTORY — PX: TRANSURETHRAL RESECTION OF PROSTATE: SHX73

## 2023-02-20 LAB — GLUCOSE, CAPILLARY
Glucose-Capillary: 223 mg/dL — ABNORMAL HIGH (ref 70–99)
Glucose-Capillary: 184 mg/dL — ABNORMAL HIGH (ref 70–99)

## 2023-02-20 LAB — HIV ANTIBODY (ROUTINE TESTING W REFLEX): HIV Screen 4th Generation wRfx: NONREACTIVE

## 2023-02-20 SURGERY — TURP (TRANSURETHRAL RESECTION OF PROSTATE)
Anesthesia: General

## 2023-02-20 MED ORDER — PROPOFOL 10 MG/ML IV BOLUS
INTRAVENOUS | Status: AC
  Filled 2023-02-20: qty 20

## 2023-02-20 MED ORDER — DEXAMETHASONE SODIUM PHOSPHATE 10 MG/ML IJ SOLN
INTRAMUSCULAR | Status: DC | PRN
  Administered 2023-02-20: 4 mg via INTRAVENOUS

## 2023-02-20 MED ORDER — ONDANSETRON HCL 4 MG/2ML IJ SOLN
INTRAMUSCULAR | Status: DC | PRN
  Administered 2023-02-20: 4 mg via INTRAVENOUS

## 2023-02-20 MED ORDER — CHLORHEXIDINE GLUCONATE 0.12 % MT SOLN
15.0000 mL | Freq: Once | OROMUCOSAL | Status: AC
  Administered 2023-02-20: 15 mL via OROMUCOSAL

## 2023-02-20 MED ORDER — MIDAZOLAM HCL 5 MG/5ML IJ SOLN
INTRAMUSCULAR | Status: DC | PRN
  Administered 2023-02-20: 2 mg via INTRAVENOUS

## 2023-02-20 MED ORDER — ROSUVASTATIN CALCIUM 20 MG PO TABS
20.0000 mg | ORAL_TABLET | Freq: Every day | ORAL | Status: DC
  Administered 2023-02-20: 20 mg via ORAL
  Filled 2023-02-20: qty 1

## 2023-02-20 MED ORDER — GLYCOPYRROLATE 0.2 MG/ML IJ SOLN
INTRAMUSCULAR | Status: AC
  Filled 2023-02-20: qty 1

## 2023-02-20 MED ORDER — HYDROMORPHONE HCL 1 MG/ML IJ SOLN: 0.5000 mg | INTRAMUSCULAR | Status: DC | PRN

## 2023-02-20 MED ORDER — FENTANYL CITRATE (PF) 100 MCG/2ML IJ SOLN
INTRAMUSCULAR | Status: AC
  Filled 2023-02-20: qty 2

## 2023-02-20 MED ORDER — EPHEDRINE SULFATE (PRESSORS) 50 MG/ML IJ SOLN
INTRAMUSCULAR | Status: DC | PRN
  Administered 2023-02-20: 10 mg via INTRAVENOUS
  Administered 2023-02-20 (×2): 5 mg via INTRAVENOUS

## 2023-02-20 MED ORDER — ONDANSETRON HCL 4 MG/2ML IJ SOLN
INTRAMUSCULAR | Status: AC
  Filled 2023-02-20: qty 2

## 2023-02-20 MED ORDER — HYDROCHLOROTHIAZIDE 25 MG PO TABS
25.0000 mg | ORAL_TABLET | Freq: Every day | ORAL | Status: DC
  Administered 2023-02-21: 25 mg via ORAL
  Filled 2023-02-20: qty 1

## 2023-02-20 MED ORDER — DEXAMETHASONE SODIUM PHOSPHATE 10 MG/ML IJ SOLN
INTRAMUSCULAR | Status: AC
  Filled 2023-02-20: qty 1

## 2023-02-20 MED ORDER — ORAL CARE MOUTH RINSE: 15.0000 mL | OROMUCOSAL | Status: DC | PRN

## 2023-02-20 MED ORDER — ACETAMINOPHEN 325 MG PO TABS: 650.0000 mg | ORAL_TABLET | ORAL | Status: DC | PRN

## 2023-02-20 MED ORDER — ACETAMINOPHEN 500 MG PO TABS
1000.0000 mg | ORAL_TABLET | Freq: Once | ORAL | Status: AC
  Administered 2023-02-20: 1000 mg via ORAL
  Filled 2023-02-20: qty 2

## 2023-02-20 MED ORDER — GENTAMICIN SULFATE 40 MG/ML IJ SOLN
5.0000 mg/kg | INTRAVENOUS | Status: AC
  Filled 2023-02-20: qty 10

## 2023-02-20 MED ORDER — OXYCODONE HCL 5 MG/5ML PO SOLN: 5.0000 mg | Freq: Once | ORAL | Status: DC | PRN

## 2023-02-20 MED ORDER — SODIUM CHLORIDE 0.9 % IR SOLN
3000.0000 mL | Status: DC
  Administered 2023-02-20 – 2023-02-21 (×2): 3000 mL

## 2023-02-20 MED ORDER — BISACODYL 10 MG RE SUPP: 10.0000 mg | Freq: Every day | RECTAL | Status: DC | PRN

## 2023-02-20 MED ORDER — GLYCOPYRROLATE 0.2 MG/ML IJ SOLN
INTRAMUSCULAR | Status: DC | PRN
  Administered 2023-02-20 (×2): .1 mg via INTRAVENOUS

## 2023-02-20 MED ORDER — FLEET ENEMA RE ENEM
1.0000 | ENEMA | Freq: Once | RECTAL | Status: DC | PRN
Start: 2023-02-20 — End: 2023-02-21

## 2023-02-20 MED ORDER — EPHEDRINE 5 MG/ML INJ
INTRAVENOUS | Status: AC
  Filled 2023-02-20: qty 5

## 2023-02-20 MED ORDER — GENTAMICIN SULFATE 40 MG/ML IJ SOLN
5.0000 mg/kg | INTRAVENOUS | Status: DC
  Filled 2023-02-20: qty 10

## 2023-02-20 MED ORDER — LOSARTAN POTASSIUM 50 MG PO TABS
100.0000 mg | ORAL_TABLET | Freq: Every day | ORAL | Status: DC
  Administered 2023-02-21: 100 mg via ORAL
  Filled 2023-02-20: qty 2

## 2023-02-20 MED ORDER — FENTANYL CITRATE (PF) 100 MCG/2ML IJ SOLN
INTRAMUSCULAR | Status: DC | PRN
  Administered 2023-02-20: 50 ug via INTRAVENOUS
  Administered 2023-02-20 (×2): 25 ug via INTRAVENOUS

## 2023-02-20 MED ORDER — ORAL CARE MOUTH RINSE: 15.0000 mL | Freq: Once | OROMUCOSAL | Status: AC

## 2023-02-20 MED ORDER — GABAPENTIN 300 MG PO CAPS
600.0000 mg | ORAL_CAPSULE | Freq: Every day | ORAL | Status: DC
  Administered 2023-02-20: 600 mg via ORAL
  Filled 2023-02-20: qty 2

## 2023-02-20 MED ORDER — LIDOCAINE HCL 1 % IJ SOLN
INTRAMUSCULAR | Status: DC | PRN
  Administered 2023-02-20: 100 mg via INTRADERMAL

## 2023-02-20 MED ORDER — AMLODIPINE BESYLATE 5 MG PO TABS
10.0000 mg | ORAL_TABLET | Freq: Every day | ORAL | Status: DC
  Administered 2023-02-20: 10 mg via ORAL
  Filled 2023-02-20: qty 2

## 2023-02-20 MED ORDER — CARVEDILOL PHOSPHATE ER 20 MG PO CP24
20.0000 mg | ORAL_CAPSULE | Freq: Every day | ORAL | Status: DC
  Administered 2023-02-21: 20 mg via ORAL
  Filled 2023-02-20: qty 1

## 2023-02-20 MED ORDER — AMISULPRIDE (ANTIEMETIC) 5 MG/2ML IV SOLN: 10.0000 mg | Freq: Once | INTRAVENOUS | Status: DC | PRN

## 2023-02-20 MED ORDER — CEFAZOLIN SODIUM-DEXTROSE 2-4 GM/100ML-% IV SOLN
2.0000 g | INTRAVENOUS | Status: AC
  Administered 2023-02-20: 2 g via INTRAVENOUS
  Filled 2023-02-20: qty 100

## 2023-02-20 MED ORDER — KCL IN DEXTROSE-NACL 20-5-0.45 MEQ/L-%-% IV SOLN
INTRAVENOUS | Status: AC
Start: 2023-02-20 — End: 2023-02-21
  Filled 2023-02-20 (×2): qty 1000

## 2023-02-20 MED ORDER — SODIUM CHLORIDE 0.9 % IR SOLN
Status: DC | PRN
  Administered 2023-02-20 (×4): 3000 mL via INTRAVESICAL

## 2023-02-20 MED ORDER — OXYCODONE HCL 5 MG PO TABS: 5.0000 mg | ORAL_TABLET | Freq: Once | ORAL | Status: DC | PRN

## 2023-02-20 MED ORDER — PROPOFOL 10 MG/ML IV BOLUS
INTRAVENOUS | Status: DC | PRN
  Administered 2023-02-20: 200 mg via INTRAVENOUS

## 2023-02-20 MED ORDER — OXYCODONE HCL 5 MG PO TABS: 5.0000 mg | ORAL_TABLET | ORAL | Status: DC | PRN

## 2023-02-20 MED ORDER — HYOSCYAMINE SULFATE 0.125 MG SL SUBL: 0.1250 mg | SUBLINGUAL_TABLET | SUBLINGUAL | Status: DC | PRN

## 2023-02-20 MED ORDER — INSULIN ASPART 100 UNIT/ML IJ SOLN
0.0000 [IU] | INTRAMUSCULAR | Status: DC | PRN
  Administered 2023-02-20: 6 [IU] via SUBCUTANEOUS

## 2023-02-20 MED ORDER — LOSARTAN POTASSIUM-HCTZ 100-25 MG PO TABS: 1.0000 | ORAL_TABLET | Freq: Every day | ORAL | Status: DC

## 2023-02-20 MED ORDER — LIDOCAINE HCL (PF) 2 % IJ SOLN
INTRAMUSCULAR | Status: AC
  Filled 2023-02-20: qty 5

## 2023-02-20 MED ORDER — ONDANSETRON HCL 4 MG/2ML IJ SOLN: 4.0000 mg | INTRAMUSCULAR | Status: DC | PRN

## 2023-02-20 MED ORDER — NITROFURANTOIN MONOHYD MACRO 100 MG PO CAPS
100.0000 mg | ORAL_CAPSULE | Freq: Two times a day (BID) | ORAL | Status: DC
Start: 2023-02-20 — End: 2023-02-27
  Administered 2023-02-20 – 2023-02-21 (×3): 100 mg via ORAL
  Filled 2023-02-20 (×3): qty 1

## 2023-02-20 MED ORDER — LACTATED RINGERS IV SOLN: INTRAVENOUS | Status: DC

## 2023-02-20 MED ORDER — PANTOPRAZOLE SODIUM 40 MG PO TBEC
80.0000 mg | DELAYED_RELEASE_TABLET | Freq: Every day | ORAL | Status: DC
  Administered 2023-02-21: 80 mg via ORAL
  Filled 2023-02-20: qty 2

## 2023-02-20 MED ORDER — GABAPENTIN 300 MG PO CAPS
300.0000 mg | ORAL_CAPSULE | Freq: Every day | ORAL | Status: DC
  Administered 2023-02-21: 300 mg via ORAL
  Filled 2023-02-20: qty 1

## 2023-02-20 MED ORDER — MIDAZOLAM HCL 2 MG/2ML IJ SOLN
INTRAMUSCULAR | Status: AC
Start: 2023-02-20 — End: ?
  Filled 2023-02-20: qty 2

## 2023-02-20 MED ORDER — FENTANYL CITRATE PF 50 MCG/ML IJ SOSY: 25.0000 ug | PREFILLED_SYRINGE | INTRAMUSCULAR | Status: DC | PRN

## 2023-02-20 MED ORDER — SENNOSIDES-DOCUSATE SODIUM 8.6-50 MG PO TABS: 1.0000 | ORAL_TABLET | Freq: Every evening | ORAL | Status: DC | PRN

## 2023-02-20 SURGICAL SUPPLY — 19 items
BAG URINE DRAIN 2000ML AR STRL (UROLOGICAL SUPPLIES) IMPLANT
BAG URO CATCHER STRL LF (MISCELLANEOUS) ×1 IMPLANT
CATH FOLEY 3WAY 30CC 22FR (CATHETERS) IMPLANT
DRAPE FOOT SWITCH (DRAPES) ×1 IMPLANT
ELECT HOOK LOOP BIPOLAR (NEEDLE) IMPLANT
ELECT REM PT RETURN 15FT ADLT (MISCELLANEOUS) ×1 IMPLANT
GLOVE SURG SS PI 8.0 STRL IVOR (GLOVE) IMPLANT
GOWN STRL REUS W/ TWL XL LVL3 (GOWN DISPOSABLE) ×1 IMPLANT
HOLDER FOLEY CATH W/STRAP (MISCELLANEOUS) IMPLANT
KIT TURNOVER KIT A (KITS) IMPLANT
LOOP CUT BIPOLAR 24F LRG (ELECTROSURGICAL) IMPLANT
MANIFOLD NEPTUNE II (INSTRUMENTS) ×1 IMPLANT
PACK CYSTO (CUSTOM PROCEDURE TRAY) ×1 IMPLANT
PAD PREP 24X48 CUFFED NSTRL (MISCELLANEOUS) ×1 IMPLANT
SYR 30ML LL (SYRINGE) IMPLANT
SYR TOOMEY IRRIG 70ML (MISCELLANEOUS)
SYRINGE TOOMEY IRRIG 70ML (MISCELLANEOUS) IMPLANT
TUBING CONNECTING 10 (TUBING) ×1 IMPLANT
TUBING UROLOGY SET (TUBING) ×1 IMPLANT

## 2023-02-20 NOTE — Transfer of Care (Signed)
Immediate Anesthesia Transfer of Care Note  Patient: Martin Hanna  Procedure(s) Performed: TRANSURETHRAL RESECTION OF THE PROSTATE (TURP)  Patient Location: PACU  Anesthesia Type:General  Level of Consciousness: sedated  Airway & Oxygen Therapy: Patient Spontanous Breathing and Patient connected to face mask oxygen  Post-op Assessment: Report given to RN and Post -op Vital signs reviewed and stable  Post vital signs: Reviewed and stable  Last Vitals:  Vitals Value Taken Time  BP    Temp    Pulse 62 02/20/23 0832  Resp 18 02/20/23 0832  SpO2 91 % 02/20/23 0832  Vitals shown include unfiled device data.  Last Pain:  Vitals:   02/20/23 0631  TempSrc:   PainSc: 0-No pain         Complications: No notable events documented.

## 2023-02-20 NOTE — Anesthesia Procedure Notes (Signed)
Procedure Name: LMA Insertion Date/Time: 02/20/2023 7:51 AM  Performed by: Floydene Flock, CRNAPre-anesthesia Checklist: Patient identified, Emergency Drugs available, Suction available, Patient being monitored and Timeout performed Patient Re-evaluated:Patient Re-evaluated prior to induction Oxygen Delivery Method: Circle system utilized Preoxygenation: Pre-oxygenation with 100% oxygen Induction Type: IV induction LMA: LMA inserted LMA Size: 5.0 Number of attempts: 2 (#4 LMA inserted withouth difficulty but would not seat. removed and placed a # 5 LMA easily. seated well. good tidal volumes good breath sounds.) Placement Confirmation: positive ETCO2 and breath sounds checked- equal and bilateral Tube secured with: Tape Dental Injury: Teeth and Oropharynx as per pre-operative assessment

## 2023-02-20 NOTE — Progress Notes (Signed)
Pharmacy Antibiotic Note  Martin Hanna is a 67 y.o. male admitted on 02/20/2023 with surgical prophylaxis.  Pharmacy has been consulted for gentamcin dosing.  Plan: Gentamicin 5 mg/kg IV x 1 dose (per Dr Annabell Howells)   Height: 5\' 5"  (165.1 cm) Weight: 106 kg (233 lb 11 oz) IBW/kg (Calculated) : 61.5  Temp (24hrs), Avg:98.2 F (36.8 C), Min:98.1 F (36.7 C), Max:98.2 F (36.8 C)  No results for input(s): "WBC", "CREATININE", "LATICACIDVEN", "VANCOTROUGH", "VANCOPEAK", "VANCORANDOM", "GENTTROUGH", "GENTPEAK", "GENTRANDOM", "TOBRATROUGH", "TOBRAPEAK", "TOBRARND", "AMIKACINPEAK", "AMIKACINTROU", "AMIKACIN" in the last 168 hours.  Estimated Creatinine Clearance: 85.9 mL/min (by C-G formula based on SCr of 0.91 mg/dL).    Allergies  Allergen Reactions   Elemental Sulfur Anaphylaxis and Swelling    Throat swelling    Sulfamethoxazole Anaphylaxis and Swelling   Thank you for allowing pharmacy to be a part of this patient's care.  Herby Abraham, Pharm.D Use secure chat for questions 02/20/2023 9:03 AM

## 2023-02-20 NOTE — Anesthesia Postprocedure Evaluation (Signed)
Anesthesia Post Note  Patient: Martin Hanna  Procedure(s) Performed: TRANSURETHRAL RESECTION OF THE PROSTATE (TURP)     Patient location during evaluation: PACU Anesthesia Type: General Level of consciousness: awake Pain management: pain level controlled Vital Signs Assessment: post-procedure vital signs reviewed and stable Respiratory status: spontaneous breathing, nonlabored ventilation and respiratory function stable Cardiovascular status: blood pressure returned to baseline and stable Postop Assessment: no apparent nausea or vomiting Anesthetic complications: no   No notable events documented.  Last Vitals:  Vitals:   02/20/23 0915 02/20/23 0931  BP: 122/75 115/76  Pulse: 71 70  Resp: 17 16  Temp: 36.5 C 36.6 C  SpO2: 94% 96%    Last Pain:  Vitals:   02/20/23 0931  TempSrc: Oral  PainSc:                  Linton Rump

## 2023-02-20 NOTE — Op Note (Signed)
Preoperative diagnosis: Bladder outlet obstruction secondary to BPH  Postoperative diagnosis:  Bladder outlet obstruction secondary to BPH  Procedure:  Cystoscopy Transurethral Resection of the prostate  Surgeon: Bjorn Pippin. M.D.  Anesthesia: general  Complications: None  EBL: 50ml  Specimens: Prostate chips  Disposition of specimens: Pathology  Indication: Martin Hanna is a patient with bladder outlet obstruction secondary to benign prostatic hyperplasia. After reviewing the management options for treatment, he elected to proceed with the above surgical procedure(s). We have discussed the potential benefits and risks of the procedure, side effects of the proposed treatment, the likelihood of the patient achieving the goals of the procedure, and any potential problems that might occur during the procedure or recuperation. Informed consent has been obtained.  Description of procedure:  The patient was taken to the operating room and general  anesthesia was induced.  The patient was placed in the dorsal lithotomy position, prepped and draped in the usual sterile fashion, and preoperative antibiotics were administered. A preoperative time-out was performed.   Cystourethroscopy was performed.  The patient's urethra was examined and was 2-3cm with bilobar prostatic hypertrophy  The bladder was then systematically examined in its entirety. There was mild/mod trabeculation with no evidence of any bladder tumors, stones, or other mucosal pathology.  The ureteral orifices were identified and marked so as to be avoided during the procedure.  The prostate adenoma was then resected utilizing loop cautery resection with the bipolar cutting loop.  The prostate adenoma from the bladder neck back to the verumontanum was resected beginning at the six o'clock position and then extended to include the right and left lobes of the prostate and anterior prostate. Care was taken not to resect  distal to the verumontanum.there were small areas of fat visible in the anterior lateral proximal prostatic urethra after resection.   At the completion of the procedure the bladder was evacuated free of chips and hemostasis was insured.  Final inspection revealed intact ureteral orifices, a widely patent TUR channel and an intact external sphincter.   Hemostasis was then achieved with the cautery and the bladder was emptied and reinspected with no significant bleeding noted at the end of the procedure.    A 40fr 3 way catheter was then placed into the bladder and placed on continuous bladder irrigation.  The patient appeared to tolerate the procedure well and without complications.  The patient was able to be awakened and transferred to the recovery unit in satisfactory condition.

## 2023-02-20 NOTE — Plan of Care (Signed)
  Problem: Education: Goal: Knowledge of General Education information will improve Description Including pain rating scale, medication(s)/side effects and non-pharmacologic comfort measures Outcome: Progressing   

## 2023-02-20 NOTE — Interval H&P Note (Signed)
History and Physical Interval Note:  02/20/2023 7:26 AM  Martin Hanna  has presented today for surgery, with the diagnosis of BENIGN PROSTATE HYPERPLASIA WITH BLADDER OUTLET OBSTRUCTION.  The various methods of treatment have been discussed with the patient and family. After consideration of risks, benefits and other options for treatment, the patient has consented to  Procedure(s): TRANSURETHRAL RESECTION OF THE PROSTATE (TURP) (N/A) as a surgical intervention.  The patient's history has been reviewed, patient examined, no change in status, stable for surgery.  I have reviewed the patient's chart and labs.  Questions were answered to the patient's satisfaction.     Bjorn Pippin

## 2023-02-21 ENCOUNTER — Encounter (HOSPITAL_COMMUNITY): Payer: Self-pay | Admitting: Urology

## 2023-02-21 DIAGNOSIS — N401 Enlarged prostate with lower urinary tract symptoms: Secondary | ICD-10-CM | POA: Diagnosis not present

## 2023-02-21 LAB — SURGICAL PATHOLOGY

## 2023-02-21 NOTE — Progress Notes (Signed)
1 Day Post-Op  Subjective: Martin Hanna is doing well with minimal catheter discomfort and clear urine.  ROS:  Review of Systems  All other systems reviewed and are negative.   Anti-infectives: Anti-infectives (From admission, onward)    Start     Dose/Rate Route Frequency Ordered Stop   02/20/23 1100  nitrofurantoin (macrocrystal-monohydrate) (MACROBID) capsule 100 mg        100 mg Oral Every 12 hours 02/20/23 0935 02/27/23 0959   02/20/23 0930  gentamicin (GARAMYCIN) 400 mg in dextrose 5 % 100 mL IVPB  Status:  Discontinued        5 mg/kg  79.3 kg (Adjusted) 110 mL/hr over 60 Minutes Intravenous On call to O.R. 02/20/23 7829 02/20/23 0855   02/20/23 0930  gentamicin (GARAMYCIN) 400 mg in dextrose 5 % 100 mL IVPB        5 mg/kg  79.3 kg (Adjusted) 110 mL/hr over 60 Minutes Intravenous On call to O.R. 02/20/23 0902 02/20/23 1109   02/20/23 0541  ceFAZolin (ANCEF) IVPB 2g/100 mL premix        2 g 200 mL/hr over 30 Minutes Intravenous 30 min pre-op 02/20/23 0541 02/20/23 0801       Current Facility-Administered Medications  Medication Dose Route Frequency Provider Last Rate Last Admin   acetaminophen (TYLENOL) tablet 650 mg  650 mg Oral Q4H PRN Bjorn Pippin, MD       amLODipine (NORVASC) tablet 10 mg  10 mg Oral q1800 Bjorn Pippin, MD   10 mg at 02/20/23 1726   bisacodyl (DULCOLAX) suppository 10 mg  10 mg Rectal Daily PRN Bjorn Pippin, MD       carvedilol (COREG CR) 24 hr capsule 20 mg  20 mg Oral Daily Bjorn Pippin, MD       dextrose 5 % and 0.45 % NaCl with KCl 20 mEq/L infusion   Intravenous Continuous Bjorn Pippin, MD 75 mL/hr at 02/21/23 0006 New Bag at 02/21/23 0006   gabapentin (NEURONTIN) capsule 300 mg  300 mg Oral Daily Bjorn Pippin, MD       gabapentin (NEURONTIN) capsule 600 mg  600 mg Oral QHS Bjorn Pippin, MD   600 mg at 02/20/23 2151   losartan (COZAAR) tablet 100 mg  100 mg Oral Daily Bjorn Pippin, MD       And   hydrochlorothiazide (HYDRODIURIL) tablet 25 mg  25 mg Oral Daily  Bjorn Pippin, MD       HYDROmorphone (DILAUDID) injection 0.5-1 mg  0.5-1 mg Intravenous Q2H PRN Bjorn Pippin, MD       hyoscyamine (LEVSIN SL) SL tablet 0.125 mg  0.125 mg Sublingual Q4H PRN Bjorn Pippin, MD       nitrofurantoin (macrocrystal-monohydrate) (MACROBID) capsule 100 mg  100 mg Oral Q12H Bjorn Pippin, MD   100 mg at 02/20/23 2151   ondansetron (ZOFRAN) injection 4 mg  4 mg Intravenous Q4H PRN Bjorn Pippin, MD       Oral care mouth rinse  15 mL Mouth Rinse PRN Bjorn Pippin, MD       oxyCODONE (Oxy IR/ROXICODONE) immediate release tablet 5 mg  5 mg Oral Q4H PRN Bjorn Pippin, MD       pantoprazole (PROTONIX) EC tablet 80 mg  80 mg Oral Daily Bjorn Pippin, MD       rosuvastatin (CRESTOR) tablet 20 mg  20 mg Oral QHS Bjorn Pippin, MD   20 mg at 02/20/23 2152   senna-docusate (Senokot-S) tablet 1 tablet  1 tablet Oral QHS PRN  Bjorn Pippin, MD       sodium chloride irrigation 0.9 % 3,000 mL  3,000 mL Irrigation Continuous Bjorn Pippin, MD   3,000 mL at 02/21/23 0558   sodium phosphate (FLEET) enema 1 enema  1 enema Rectal Once PRN Bjorn Pippin, MD         Objective: Vital signs in last 24 hours: Temp:  [97.7 F (36.5 C)-98.6 F (37 C)] 98.5 F (36.9 C) (11/27 0420) Pulse Rate:  [66-81] 66 (11/27 0420) Resp:  [16-20] 18 (11/27 0420) BP: (93-144)/(63-80) 144/80 (11/27 0420) SpO2:  [93 %-97 %] 94 % (11/27 0420)  Intake/Output from previous day: 11/26 0701 - 11/27 0700 In: 4342.9 [P.O.:1200; I.V.:2042.9; IV Piggyback:100] Out: 5050 [Urine:5050] Intake/Output this shift: No intake/output data recorded.   Physical Exam Vitals reviewed.  Constitutional:      Appearance: Normal appearance.  Genitourinary:    Comments: Urine clear in foley bag.  Neurological:     Mental Status: He is alert.     Lab Results:  No results for input(s): "WBC", "HGB", "HCT", "PLT" in the last 72 hours. BMET No results for input(s): "NA", "K", "CL", "CO2", "GLUCOSE", "BUN", "CREATININE", "CALCIUM" in the  last 72 hours. PT/INR No results for input(s): "LABPROT", "INR" in the last 72 hours. ABG No results for input(s): "PHART", "HCO3" in the last 72 hours.  Invalid input(s): "PCO2", "PO2"  Studies/Results: No results found.   Assessment and Plan: BPH with BOO and OAB.  He is doing well s/p TURP.  Foley out and D/C when voiding.        LOS: 0 days    Bjorn Pippin 11/27/2024Patient ID: Martin Hanna, male   DOB: 1955-04-13, 67 y.o.   MRN: [id number]

## 2023-02-21 NOTE — Care Management Obs Status (Signed)
MEDICARE OBSERVATION STATUS NOTIFICATION   Patient Details  Name: Martin Hanna MRN: [id number] Date of Birth: 1956-01-06   Medicare Observation Status Notification Given:       Howell Rucks, RN 02/21/2023, 9:25 AM

## 2023-02-21 NOTE — TOC CM/SW Note (Signed)
Transition of Care [facility]) - Inpatient Brief Assessment   Patient Details  Name: Martin Hanna MRN: [id number] Date of Birth: Oct 23, 1955  Transition of Care [facility]) CM/SW Contact:    Howell Rucks, RN Phone Number: 02/21/2023, 9:26 AM   Clinical Narrative: Met with pt at bedside to introduce role of TOC/NCM and review for dc planning, pt reports he has an established PCP and pharmacy, no current home care services, reports he has a walker and cane from a recent knee surgery, pt reports he feels safe returning home with support from his spouse, confirmed transportation is available at discharge. MOON explaine to pt, declined to sign, provided copy of MOON. TOC Brief Assessment completed. No TOC needs identified at this time.     Transition of Care Asessment: Insurance and Status: Insurance coverage has been reviewed Patient has primary care physician: Yes Home environment has been reviewed: resides in private residencew with spouse Prior level of function:: Independent with walker or cane as needed secondary to recent knee surgery Prior/Current Home Services: No current home services Social Determinants of Health Reivew: SDOH reviewed no interventions necessary Readmission risk has been reviewed: Yes Transition of care needs: no transition of care needs at this time

## 2023-02-23 ENCOUNTER — Other Ambulatory Visit: Payer: Self-pay | Admitting: Urology

## 2023-02-23 MED ORDER — ONDANSETRON 4 MG PO TBDP: 4.0000 mg | ORAL_TABLET | Freq: Three times a day (TID) | ORAL | 0 refills | Status: AC | PRN

## 2023-02-25 ENCOUNTER — Other Ambulatory Visit: Payer: Self-pay

## 2023-02-25 ENCOUNTER — Emergency Department (HOSPITAL_COMMUNITY)
Admission: EM | Admit: 2023-02-25 | Discharge: 2023-02-25 | Disposition: A | Discharge status: Home / Self Care | Payer: Medicare HMO | Attending: Emergency Medicine | Admitting: Emergency Medicine

## 2023-02-25 ENCOUNTER — Encounter (HOSPITAL_COMMUNITY): Payer: Self-pay

## 2023-02-25 DIAGNOSIS — E871 Hypo-osmolality and hyponatremia: Secondary | ICD-10-CM | POA: Diagnosis not present

## 2023-02-25 DIAGNOSIS — E876 Hypokalemia: Secondary | ICD-10-CM | POA: Diagnosis not present

## 2023-02-25 DIAGNOSIS — R110 Nausea: Secondary | ICD-10-CM | POA: Insufficient documentation

## 2023-02-25 DIAGNOSIS — Z79899 Other long term (current) drug therapy: Secondary | ICD-10-CM | POA: Diagnosis not present

## 2023-02-25 DIAGNOSIS — R509 Fever, unspecified: Secondary | ICD-10-CM | POA: Insufficient documentation

## 2023-02-25 DIAGNOSIS — R300 Dysuria: Secondary | ICD-10-CM | POA: Diagnosis not present

## 2023-02-25 DIAGNOSIS — G8918 Other acute postprocedural pain: Secondary | ICD-10-CM | POA: Insufficient documentation

## 2023-02-25 DIAGNOSIS — R944 Abnormal results of kidney function studies: Secondary | ICD-10-CM | POA: Diagnosis not present

## 2023-02-25 DIAGNOSIS — I10 Essential (primary) hypertension: Secondary | ICD-10-CM | POA: Diagnosis not present

## 2023-02-25 DIAGNOSIS — R739 Hyperglycemia, unspecified: Secondary | ICD-10-CM | POA: Diagnosis not present

## 2023-02-25 DIAGNOSIS — E878 Other disorders of electrolyte and fluid balance, not elsewhere classified: Secondary | ICD-10-CM | POA: Insufficient documentation

## 2023-02-25 LAB — COMPREHENSIVE METABOLIC PANEL
ALT: 36 U/L (ref 0–44)
AST: 27 U/L (ref 15–41)
Albumin: 3.5 g/dL (ref 3.5–5.0)
Alkaline Phosphatase: 50 U/L (ref 38–126)
Anion gap: 10 (ref 5–15)
BUN: 23 mg/dL (ref 8–23)
CO2: 28 mmol/L (ref 22–32)
Calcium: 8.4 mg/dL — ABNORMAL LOW (ref 8.9–10.3)
Chloride: 93 mmol/L — ABNORMAL LOW (ref 98–111)
Creatinine, Ser: 1.24 mg/dL (ref 0.61–1.24)
GFR, Estimated: >60 mL/min (ref >60)
Glucose, Bld: 213 mg/dL — ABNORMAL HIGH (ref 70–99)
Potassium: 3.3 mmol/L — ABNORMAL LOW (ref 3.5–5.1)
Sodium: 131 mmol/L — ABNORMAL LOW (ref 135–145)
Total Bilirubin: 0.6 mg/dL (ref <1.2)
Total Protein: 6.7 g/dL (ref 6.5–8.1)

## 2023-02-25 LAB — URINALYSIS, ROUTINE W REFLEX MICROSCOPIC
Bilirubin Urine: NEGATIVE
Glucose, UA: >=500 mg/dL — AB
Ketones, ur: NEGATIVE mg/dL
Nitrite: NEGATIVE
Protein, ur: >=300 mg/dL — AB
RBC / HPF: >50 RBC/hpf (ref 0–5)
Specific Gravity, Urine: 1.02 (ref 1.005–1.030)
WBC, UA: >50 WBC/hpf (ref 0–5)
pH: 5 (ref 5.0–8.0)

## 2023-02-25 LAB — CBC
HCT: 50.6 % (ref 39.0–52.0)
Hemoglobin: 16.3 g/dL (ref 13.0–17.0)
MCH: 29.9 pg (ref 26.0–34.0)
MCHC: 32.2 g/dL (ref 30.0–36.0)
MCV: 92.7 fL (ref 80.0–100.0)
Platelets: 151 10*3/uL (ref 150–400)
RBC: 5.46 MIL/uL (ref 4.22–5.81)
RDW: 13.2 % (ref 11.5–15.5)
WBC: 9 10*3/uL (ref 4.0–10.5)
nRBC: 0 % (ref 0.0–0.2)

## 2023-02-25 MED ORDER — POTASSIUM CHLORIDE CRYS ER 20 MEQ PO TBCR
40.0000 meq | EXTENDED_RELEASE_TABLET | Freq: Once | ORAL | Status: AC
  Administered 2023-02-25: 40 meq via ORAL
  Filled 2023-02-25: qty 2

## 2023-02-25 MED ORDER — ONDANSETRON 4 MG PO TBDP
4.0000 mg | ORAL_TABLET | Freq: Once | ORAL | Status: AC
  Administered 2023-02-25: 4 mg via ORAL
  Filled 2023-02-25: qty 1

## 2023-02-25 MED ORDER — SODIUM CHLORIDE 0.9 % IV BOLUS
1000.0000 mL | Freq: Once | INTRAVENOUS | Status: AC
  Administered 2023-02-25: 1000 mL via INTRAVENOUS

## 2023-02-25 MED ORDER — CIPROFLOXACIN HCL 500 MG PO TABS: 500.0000 mg | ORAL_TABLET | Freq: Two times a day (BID) | ORAL | 0 refills | Status: DC

## 2023-02-25 NOTE — ED Provider Notes (Cosign Needed Addendum)
[facility] AT [facility] Provider Note   CSN: [id number] Arrival date & time: 02/25/23  4098     History  Chief Complaint  Patient presents with   Urinary Retention    Martin Hanna is a 67 y.o. male.  With a history of seizure disorder, hypertension, hypercholesterolemia, BPH with TURP on 02/20/2023 presenting to the ED for evaluation of sensation of incomplete voiding, pain with urination, nausea, subjective fevers.  He states he drank 6 bottles of water yesterday and was able to fill a bedside urinal throughout the night.  Today he woke up and has had decreased urine output and states he is only urinating small amounts at a time.  He states he has a history of sepsis due to UTI requiring hospitalization.  He denies any abdominal pain at rest.  He has not checked his temperature to confirm fever.  He denies any back pain.  No testicle pain.  HPI     Home Medications Prior to Admission medications   Medication Sig Start Date End Date Taking? Authorizing Provider  ciprofloxacin (CIPRO) 500 MG tablet Take 1 tablet (500 mg total) by mouth every 12 (twelve) hours. 02/25/23  Yes Emalia Witkop, Edsel Petrin, PA-C  acetaminophen (TYLENOL) 500 MG tablet Take 1,000 mg by mouth every 6 (six) hours as needed for moderate pain (pain score 4-6).    [provider]  amLODipine (NORVASC) 10 MG tablet Take 10 mg by mouth daily. In the evening    [provider]  Ascorbic Acid (VITAMIN C) 1000 MG tablet Take 1,000 mg by mouth daily.    [provider]  aspirin 81 MG tablet Take 81 mg by mouth daily.    [provider]  carvedilol (COREG CR) 20 MG 24 hr capsule Take 20 mg by mouth daily.    [provider]  CINNAMON PO Take 2,000 mg by mouth daily.    [provider]  cyanocobalamin (VITAMIN B12) 1000 MCG tablet Take 1,000 mcg by mouth daily.    [provider]  gabapentin (NEURONTIN) 300 MG capsule Take 300-600  mg by mouth See admin instructions. Take 300 mg in the morning and 600 mg at night 11/06/22   [provider]  losartan-hydrochlorothiazide (HYZAAR) 100-25 MG tablet Take 1 tablet by mouth daily. 09/11/20   [provider]  methenamine (MANDELAMINE) 1 g tablet Take 1,000 mg by mouth 2 (two) times daily. 04/19/20   [provider]  Multiple Vitamins-Minerals (MULTIVITAMIN WITH MINERALS) tablet Take 1 tablet by mouth daily.    [provider]  nitrofurantoin (MACRODANTIN) 50 MG capsule Take 50 mg by mouth at bedtime. 02/05/23   [provider]  Omega-3 Fatty Acids (FISH OIL) 1000 MG CAPS Take 2,000 mg by mouth daily.    [provider]  omeprazole (PRILOSEC) 40 MG capsule Take 40 mg by mouth daily. 12/13/22   [provider]  ondansetron (ZOFRAN-ODT) 4 MG disintegrating tablet Take 1 tablet (4 mg total) by mouth every 8 (eight) hours as needed for nausea or vomiting. 02/23/23   Stoneking, Danford Bad., MD  rosuvastatin (CRESTOR) 20 MG tablet Take 20 mg by mouth at bedtime.    [provider]      Allergies    Elemental sulfur and Sulfamethoxazole    Review of Systems   Review of Systems  Genitourinary:  Positive for dysuria and hematuria.  All other systems reviewed and are negative.   Physical Exam Updated Vital Signs  BP (!) 119/93 (BP Location: Left Arm)   Pulse 80   Temp 98.4 F (36.9 C) (Oral)   Resp 18   Ht 5\' 5"  (1.651 m)   Wt 106 kg   SpO2 96%   BMI 38.89 kg/m  Physical Exam Vitals and nursing note reviewed.  Constitutional:      General: He is not in acute distress.    Appearance: He is well-developed.     Comments: Resting comfortably in bed  HENT:     Head: Normocephalic and atraumatic.  Eyes:     Conjunctiva/sclera: Conjunctivae normal.  Cardiovascular:     Rate and Rhythm: Normal rate and regular rhythm.     Heart sounds: No murmur heard. Pulmonary:     Effort: Pulmonary effort is normal. No  respiratory distress.     Breath sounds: Normal breath sounds.  Abdominal:     Palpations: Abdomen is soft.     Tenderness: There is no abdominal tenderness. There is no guarding.  Musculoskeletal:        General: No swelling.     Cervical back: Neck supple.  Skin:    General: Skin is warm and dry.     Capillary Refill: Capillary refill takes less than 2 seconds.  Neurological:     Mental Status: He is alert.  Psychiatric:        Mood and Affect: Mood normal.     ED Results / Procedures / Treatments   Labs (all labs ordered are listed, but only abnormal results are displayed) Labs Reviewed  URINALYSIS, ROUTINE W REFLEX MICROSCOPIC - Abnormal; Notable for the following components:      Result Value   APPearance CLOUDY (*)    Glucose, UA >=500 (*)    Hgb urine dipstick LARGE (*)    Protein, ur >=300 (*)    Leukocytes,Ua LARGE (*)    Bacteria, UA FEW (*)    All other components within normal limits  COMPREHENSIVE METABOLIC PANEL - Abnormal; Notable for the following components:   Sodium 131 (*)    Potassium 3.3 (*)    Chloride 93 (*)    Glucose, Bld 213 (*)    Calcium 8.4 (*)    All other components within normal limits  URINE CULTURE  CBC    EKG None  Radiology No results found.  Procedures Procedures    Medications Ordered in ED Medications  potassium chloride SA (KLOR-CON M) CR tablet 40 mEq (has no administration in time range)  ondansetron (ZOFRAN-ODT) disintegrating tablet 4 mg (4 mg Oral Given 02/25/23 1019)  sodium chloride 0.9 % bolus 1,000 mL (1,000 mLs Intravenous New Bag/Given 02/25/23 1402)    ED Course/ Medical Decision Making/ A&P                                 Medical Decision Making Amount and/or Complexity of Data Reviewed Labs: ordered.  Risk Prescription drug management.  This patient presents to the ED for concern of dysuria, this involves an extensive number of treatment options, and is a complaint that carries with it a high  risk of complications and morbidity.  The differential diagnosis includes postoperative complication, cystitis,  My initial workup includes labs, symptom control  Additional history obtained from: Nursing notes from this visit. Previous records within EMR system TURP procedure on 02/20/2023 with Dr. Annabell Howells Family wife at bedside provides a portion of the history  I ordered, reviewed and  interpreted labs which include CBC, CMP, urinalysis, urine culture.  No leukocytosis or anemia.  Urine with large hemoglobin, large leukocytes, greater than 50 white blood cells, few bacteria.  CMP with hyponatremia 131, hypokalemia of 3.3, hypochloremia of 93, hyperglycemia of 213, creatinine elevated to 1.24 with a baseline of 0.9 with a GFR greater than 60  Consultations Obtained:  I requested consultation with urology Dr. Arita Miss,  and discussed lab and imaging findings as well as pertinent plan - they recommend: Urine consistent with recent TURP, may treat with antibiotics, switch to ciprofloxacin  Afebrile, hemodynamically stable.  67 year old male presenting for evaluation of pain with urination and decreased urination since this morning. He was urinating frequently over the evening.  The amount of urination decreased throughout the night and he was urinating significantly less than normal but still has a sensation of incomplete voiding and pain with urination.  Bladder scan here revealed 0 mL.  Bedside ultrasound revealed a small and mostly empty bladder, less than 25 mL.  No pain at rest.  No abdominal pain.  Some nausea and subjective fevers yesterday.  No subjective fever since.  Lab workup was reassuring.  No leukocytosis or anemia.  Consulted with urology who recommends potentially switching to ciprofloxacin.  Will treat his elevated creatinine and hyponatremia with IV fluids.  Hypokalemia was treated in the emergency department.  He was able to tolerate oral intake without difficulty.  Prescription for  ciprofloxacin was sent.  He states he will follow-up with Dr. Annabell Howells as early as possible next week.  He is requesting discharge home.  Believe this is reasonable.  He was given return precautions.  Stable at discharge.  At this time there does not appear to be any evidence of an acute emergency medical condition and the patient appears stable for discharge with appropriate outpatient follow up. Diagnosis was discussed with patient who verbalizes understanding of care plan and is agreeable to discharge. I have discussed return precautions with patient and wife who verbalizes understanding. Patient encouraged to follow-up with their primary urologist as soon as possible. All questions answered.  Patient's case discussed with Dr. Wallace Cullens who agrees with plan to discharge with follow-up.   Note: Portions of this report may have been transcribed using voice recognition software. Every effort was made to ensure accuracy; however, inadvertent computerized transcription errors may still be present.        Final Clinical Impression(s) / ED Diagnoses Final diagnoses:  Dysuria  Post-operative pain    Rx / DC Orders ED Discharge Orders          Ordered    ciprofloxacin (CIPRO) 500 MG tablet  Every 12 hours        02/25/23 1410              Michelle Piper, PA-C 02/25/23 1414    Demonta Wombles, Edsel Petrin, PA-C 02/25/23 1415    Sloan Leiter, DO 03/01/23 2310

## 2023-02-25 NOTE — Discharge Instructions (Signed)
You have been seen today for your complaint of pain with urination, decreased urination. Your lab work showed that you potentially have a urinary tract infection. Your discharge medications include ciprofloxacin. This is an antibiotic. You should take it as prescribed. You should take it for the entire duration of the prescription. This may cause an upset stomach. This is normal. You may take this with food. You may also eat yogurt to prevent diarrhea.  Discontinue your Macrobid use once you begin taking ciprofloxacin. Follow up with: Dr. Annabell Howells as soon as possible Please seek immediate medical care if you develop any of the following symptoms: Your pain is severe and not relieved with medicines. You cannot eat or drink without vomiting. You are confused. You have a rapid heartbeat while resting. You have shaking or chills. You feel extremely weak. At this time there does not appear to be the presence of an emergent medical condition, however there is always the potential for conditions to change. Please read and follow the below instructions.  Do not take your medicine if  develop an itchy rash, swelling in your mouth or lips, or difficulty breathing; call 911 and seek immediate emergency medical attention if this occurs.  You may review your lab tests and imaging results in their entirety on your MyChart account.  Please discuss all results of fully with your primary care provider and other specialist at your follow-up visit.  Note: Portions of this text may have been transcribed using voice recognition software. Every effort was made to ensure accuracy; however, inadvertent computerized transcription errors may still be present.

## 2023-02-25 NOTE — ED Triage Notes (Signed)
Patient reports he had a procedure done on Tuesday for his prostate. Since he has been having pain with urination, feels as though he is not emptying his bladder, nausea, and fevers.

## 2023-02-26 DIAGNOSIS — N401 Enlarged prostate with lower urinary tract symptoms: Secondary | ICD-10-CM | POA: Diagnosis not present

## 2023-02-26 DIAGNOSIS — R350 Frequency of micturition: Secondary | ICD-10-CM | POA: Diagnosis not present

## 2023-02-26 DIAGNOSIS — N3000 Acute cystitis without hematuria: Secondary | ICD-10-CM | POA: Diagnosis not present

## 2023-02-27 LAB — URINE CULTURE: Culture: >=100000 — AB

## 2023-02-27 NOTE — Discharge Summary (Signed)
Physician Discharge Summary  Patient ID: Martin Hanna MRN: [id number] DOB/AGE: October 20, 1955 67 y.o.  Admit date: 02/20/2023 Discharge date: 02/27/2023  Admission Diagnoses:  BPH with obstruction/lower urinary tract symptoms  Discharge Diagnoses:  Principal Problem:   BPH with obstruction/lower urinary tract symptoms   Past Medical History:  Diagnosis Date   Hypercholesteremia    Hypertension    Neuropathy    PONV (postoperative nausea and vomiting)    Pre-diabetes    Seizures (HCC)    x59 at 67 years old; none before or since that time   Sleep apnea     Surgeries: Procedure(s): TRANSURETHRAL RESECTION OF THE PROSTATE (TURP) on 02/20/2023   Consultants (if any):   Discharged Condition: Improved  Hospital Course: JAYDEM DITOMASO is an 67 y.o. male who was admitted 02/20/2023 with a diagnosis of BPH with obstruction/lower urinary tract symptoms and went to the operating room on 02/20/2023 and underwent the above named procedures.  His urine was clear so the foley was removed on 02/21/23 and he was discharged when voiding.    He was given perioperative antibiotics:  Anti-infectives (From admission, onward)    Start     Dose/Rate Route Frequency Ordered Stop   02/20/23 1100  nitrofurantoin (macrocrystal-monohydrate) (MACROBID) capsule 100 mg  Status:  Discontinued        100 mg Oral Every 12 hours 02/20/23 0935 02/21/23 1621   02/20/23 0930  gentamicin (GARAMYCIN) 400 mg in dextrose 5 % 100 mL IVPB  Status:  Discontinued        5 mg/kg  79.3 kg (Adjusted) 110 mL/hr over 60 Minutes Intravenous On call to O.R. 02/20/23 5956 02/20/23 0855   02/20/23 0930  gentamicin (GARAMYCIN) 400 mg in dextrose 5 % 100 mL IVPB        5 mg/kg  79.3 kg (Adjusted) 110 mL/hr over 60 Minutes Intravenous On call to O.R. 02/20/23 0902 02/20/23 1109   02/20/23 0541  ceFAZolin (ANCEF) IVPB 2g/100 mL premix        2 g 200 mL/hr over 30 Minutes Intravenous 30 min pre-op 02/20/23 0541 02/20/23  0801     .  He was given sequential compression devices, for DVT prophylaxis.  He benefited maximally from the hospital stay and there were no complications.    Recent vital signs:  Vitals:   02/21/23 0420 02/21/23 0829  BP: (!) 144/80 (!) 154/89  Pulse: 66   Resp: 18   Temp: 98.5 F (36.9 C)   SpO2: 94%     Recent laboratory studies:  Lab Results  Component Value Date   HGB 16.3 02/25/2023   HGB 15.5 02/12/2023   HGB 15.3 05/05/2021   Lab Results  Component Value Date   WBC 9.0 02/25/2023   PLT 151 02/25/2023   Lab Results  Component Value Date   INR 1.2 02/07/2020   Lab Results  Component Value Date   NA 131 (L) 02/25/2023   K 3.3 (L) 02/25/2023   CL 93 (L) 02/25/2023   CO2 28 02/25/2023   BUN 23 02/25/2023   CREATININE 1.24 02/25/2023   GLUCOSE 213 (H) 02/25/2023    Discharge Medications:   Allergies as of 02/21/2023       Reactions   Elemental Sulfur Anaphylaxis, Swelling   Throat swelling    Sulfamethoxazole Anaphylaxis, Swelling        Medication List     TAKE these medications    acetaminophen 500 MG tablet Commonly known as: TYLENOL Take 1,000 mg  by mouth every 6 (six) hours as needed for moderate pain (pain score 4-6).   amLODipine 10 MG tablet Commonly known as: NORVASC Take 10 mg by mouth daily. In the evening   aspirin 81 MG tablet Take 81 mg by mouth daily.   carvedilol 20 MG 24 hr capsule Commonly known as: COREG CR Take 20 mg by mouth daily.   CINNAMON PO Take 2,000 mg by mouth daily.   cyanocobalamin 1000 MCG tablet Commonly known as: VITAMIN B12 Take 1,000 mcg by mouth daily.   Fish Oil 1000 MG Caps Take 2,000 mg by mouth daily.   gabapentin 300 MG capsule Commonly known as: NEURONTIN Take 300-600 mg by mouth See admin instructions. Take 300 mg in the morning and 600 mg at night   losartan-hydrochlorothiazide 100-25 MG tablet Commonly known as: HYZAAR Take 1 tablet by mouth daily.   methenamine 1 g  tablet Commonly known as: MANDELAMINE Take 1,000 mg by mouth 2 (two) times daily.   multivitamin with minerals tablet Take 1 tablet by mouth daily.   nitrofurantoin 50 MG capsule Commonly known as: MACRODANTIN Take 50 mg by mouth at bedtime.   omeprazole 40 MG capsule Commonly known as: PRILOSEC Take 40 mg by mouth daily.   rosuvastatin 20 MG tablet Commonly known as: CRESTOR Take 20 mg by mouth at bedtime.   vitamin C 1000 MG tablet Take 1,000 mg by mouth daily.        Diagnostic Studies: No results found.  Disposition: Discharge disposition: 01-Home or Self Care          Follow-up Information     [facility] Follow up on 03/06/2023.   Why: 815a Contact information: [address] [place] [address] [phone]                 Signed: Bjorn Pippin 02/27/2023, 9:21 AM

## 2023-02-28 ENCOUNTER — Telehealth (HOSPITAL_BASED_OUTPATIENT_CLINIC_OR_DEPARTMENT_OTHER): Payer: Self-pay

## 2023-02-28 DIAGNOSIS — N3000 Acute cystitis without hematuria: Secondary | ICD-10-CM | POA: Diagnosis not present

## 2023-02-28 NOTE — Progress Notes (Signed)
ED Antimicrobial Stewardship Positive Culture Follow Up   Martin Hanna is an 67 y.o. male who presented to [facility] on 02/25/2023 with a chief complaint of  Chief Complaint  Patient presents with   Urinary Retention    Recent Results (from the past 720 hour(s))  Urine Culture     Status: Abnormal   Collection Time: 02/25/23 12:59 PM   Specimen: Urine, Clean Catch  Result Value Ref Range Status   Specimen Description   Final    URINE, CLEAN CATCH Performed at [facility], [address] [place], [place] [address]    Special Requests   Final    NONE Performed at [facility], [address], [place], [place] [address]    Culture (A)  Final    >=100,000 COLONIES/mL ESCHERICHIA COLI Confirmed Extended Spectrum Beta-Lactamase Producer (ESBL).  In bloodstream infections from ESBL organisms, carbapenems are preferred over piperacillin/tazobactam. They are shown to have a lower risk of mortality.    Report Status 02/27/2023 FINAL  Final   Organism ID, Bacteria ESCHERICHIA COLI (A)  Final      Susceptibility   Escherichia coli - MIC*    AMPICILLIN >=32 RESISTANT Resistant     CEFAZOLIN >=64 RESISTANT Resistant     CEFEPIME 16 RESISTANT Resistant     CEFTRIAXONE >=64 RESISTANT Resistant     CIPROFLOXACIN >=4 RESISTANT Resistant     GENTAMICIN <=1 SENSITIVE Sensitive     IMIPENEM <=0.25 SENSITIVE Sensitive     NITROFURANTOIN <=16 SENSITIVE Sensitive     TRIMETH/SULFA >=320 RESISTANT Resistant     AMPICILLIN/SULBACTAM 4 SENSITIVE Sensitive     PIP/TAZO <=4 SENSITIVE Sensitive ug/mL    * >=100,000 COLONIES/mL ESCHERICHIA COLI    TURP 11/26- Now with dysuria/hematuria   [x]  Treated with ciprofloxacin, organism resistant to prescribed antimicrobial []  Patient discharged originally without antimicrobial agent and treatment is now indicated  New antibiotic prescription: Stop ciprofloxacin Start nitrofurantoin 100 mg BID X 7 days   Follow-up with urologist, Dr. Annabell Howells   ED Provider: Evlyn Kanner, PA-C    Sharin Mons, PharmD, BCPS, BCIDP Infectious Diseases Clinical Pharmacist Phone: [phone] 02/28/2023, 8:41 AM

## 2023-02-28 NOTE — Telephone Encounter (Signed)
Post ED Visit - Positive Culture Follow-up: Successful Patient Follow-Up  Culture assessed and recommendations reviewed by:  []  Enzo Bi, Pharm.D. []  Celedonio Miyamoto, Pharm.D., BCPS AQ-ID []  Garvin Fila, Pharm.D., BCPS []  Georgina Pillion, Pharm.D., BCPS []  [place], [address].D., BCPS, AAHIVP []  Estella Husk, Pharm.D., BCPS, AAHIVP []  Lysle Pearl, PharmD, BCPS []  Phillips Climes, PharmD, BCPS []  Agapito Games, PharmD, BCPS []  Verlan Friends, PharmD Juliann Pares  Sharin Mons, PharmD Positive urine culture  []  Patient discharged without antimicrobial prescription and treatment is now indicated [x]  Organism is resistant to prescribed ED discharge antimicrobial []  Patient with positive blood cultures  Changes discussed with ED provider: Evlyn Kanner PA-C New antibiotic prescription Nitrofurantoin 100 mg BID Called to   Contact with patient attempted, date 02/28/23, time 9:44 LVM requesting callback on mobile # (No answer on home #)  Letter sent   Arvid Right 02/28/2023, 9:38 AM

## 2023-03-01 ENCOUNTER — Telehealth (HOSPITAL_BASED_OUTPATIENT_CLINIC_OR_DEPARTMENT_OTHER): Payer: Self-pay | Admitting: *Deleted

## 2023-03-01 DIAGNOSIS — N3000 Acute cystitis without hematuria: Secondary | ICD-10-CM | POA: Diagnosis not present

## 2023-03-01 NOTE — Telephone Encounter (Signed)
Post ED Visit - Positive Culture Follow-up: Successful Patient Follow-Up  Culture assessed and recommendations reviewed by:  []  Enzo Bi, Pharm.D. []  Celedonio Miyamoto, Pharm.D., BCPS AQ-ID []  Garvin Fila, Pharm.D., BCPS []  Georgina Pillion, Pharm.D., BCPS []  [place], [address].D., BCPS, AAHIVP []  Estella Husk, Pharm.D., BCPS, AAHIVP []  Lysle Pearl, PharmD, BCPS []  Phillips Climes, PharmD, BCPS []  Agapito Games, PharmD, BCPS []  Verlan Friends, PharmD  Positive urine  culture  []  Patient discharged without antimicrobial prescription and treatment is now indicated [x]  Organism is resistant to prescribed ED discharge antimicrobial []  Patient with positive blood cultures  Changes discussed with ED provider: Evlyn Kanner PA-C Consuella Lose( spouce) called and stated Patient has already been following up with Dr. Annabell Howells and is recieving two injections of an antibiotic x 3 doses and then starting Macrobid. Consuella Lose couldn't tell me the name of the injection anitibiotic however she stated it was strongest they had and it had to split it into two doses to give.   Patient called date 03-01-23, time 0818   Bing Quarry 03/01/2023, 8:15 AM

## 2023-03-02 DIAGNOSIS — N3000 Acute cystitis without hematuria: Secondary | ICD-10-CM | POA: Diagnosis not present

## 2023-03-05 ENCOUNTER — Telehealth (HOSPITAL_BASED_OUTPATIENT_CLINIC_OR_DEPARTMENT_OTHER): Payer: Self-pay | Admitting: *Deleted

## 2023-03-06 DIAGNOSIS — N3000 Acute cystitis without hematuria: Secondary | ICD-10-CM | POA: Diagnosis not present

## 2023-03-07 DIAGNOSIS — M1712 Unilateral primary osteoarthritis, left knee: Secondary | ICD-10-CM | POA: Diagnosis not present

## 2023-03-13 ENCOUNTER — Ambulatory Visit: Payer: Medicare HMO | Attending: Infectious Diseases | Admitting: Infectious Diseases

## 2023-03-13 VITALS — BP 157/93 | HR 87 | Temp 98.7°F | Ht 65.0 in | Wt 228.0 lb

## 2023-03-13 DIAGNOSIS — N400 Enlarged prostate without lower urinary tract symptoms: Secondary | ICD-10-CM

## 2023-03-13 DIAGNOSIS — B9620 Unspecified Escherichia coli [E. coli] as the cause of diseases classified elsewhere: Secondary | ICD-10-CM | POA: Insufficient documentation

## 2023-03-13 DIAGNOSIS — Z22358 Carrier of other enterobacterales: Secondary | ICD-10-CM | POA: Diagnosis not present

## 2023-03-13 DIAGNOSIS — Z9079 Acquired absence of other genital organ(s): Secondary | ICD-10-CM

## 2023-03-13 DIAGNOSIS — Z1612 Extended spectrum beta lactamase (ESBL) resistance: Secondary | ICD-10-CM | POA: Insufficient documentation

## 2023-03-13 DIAGNOSIS — N390 Urinary tract infection, site not specified: Secondary | ICD-10-CM | POA: Insufficient documentation

## 2023-03-13 DIAGNOSIS — R300 Dysuria: Secondary | ICD-10-CM | POA: Diagnosis not present

## 2023-03-13 NOTE — Patient Instructions (Signed)
  Martin Hanna, during your visit today, we reviewed your recent health concerns following your prostate surgery. You have shown significant improvement in your urinary symptoms since switching to nitrofurantoin for your urinary tract infection. We also discussed your knee discomfort and your current management of hypertension.  YOUR PLAN:  -BENIGN PROSTATIC HYPERPLASIA (BPH): BPH is an enlargement of the prostate gland that can cause urinary problems. You recently had surgery to address this, and your postoperative symptoms have improved with nitrofurantoin. Please continue taking nitrofurantoin as prescribed and discuss the duration of therapy with your urologist at your next appointment.  -URINARY TRACT INFECTION (UTI): A UTI is an infection in any part of your urinary system. You were diagnosed with a UTI caused by a resistant strain of bacteria and have improved on nitrofurantoin. Continue taking nitrofurantoin as prescribed by your urologist.  -HYPERTENSION: Hypertension is high blood pressure. Your blood pressure was elevated today, possibly due to recent dietary choices. Continue your current medications (losartan, amlodipine, carvedilol) and follow a low-salt diet.  -GENERAL HEALTH MAINTENANCE: Continue taking your current medications, including dutasteride for BPH, Crestor for cholesterol, and aspirin. Stay hydrated and urinate frequently, especially during physical activity. Plan to discuss your coffee intake with your urologist at your next appointment.  INSTRUCTIONS:  Please continue taking nitrofurantoin as prescribed and discuss the duration of therapy with your urologist at your next appointment. Follow your current antihypertensive regimen and adhere to a low-salt diet. Stay hydrated and urinate frequently, especially during physical activity. Plan to discuss your coffee intake with your urologist at your next appointment.

## 2023-03-13 NOTE — Progress Notes (Unsigned)
NAME: Martin Hanna  DOB: 28-Dec-1955  MRN: [id number]  Date/Time: 03/13/2023 12:36 PM  REQUESTING PROVIDER Subjective:  REASON FOR CONSULT:  ? Martin Hanna is a 68 y.o. with a history of   ID   Steroid/immune suppressants/splenectomy/Hardware Recent Procedure Surgery Injections Trauma Sick contacts Travel Antibiotic use Food- raw/exotic Animal bites Tick exposure Water sports Fishing/hunting/animal bird exposure Past Medical History:  Diagnosis Date   Hypercholesteremia    Hypertension    Neuropathy    PONV (postoperative nausea and vomiting)    Pre-diabetes    Seizures (HCC)    x18 at 67 years old; none before or since that time   Sleep apnea     Past Surgical History:  Procedure Laterality Date   APPENDECTOMY     CHOLECYSTECTOMY     COLONOSCOPY N/A 02/12/2015   Procedure: COLONOSCOPY;  Surgeon: Scot Jun, MD;  Location: [facility] ENDOSCOPY;  Service: Endoscopy;  Laterality: N/A;   GANGLION CYST EXCISION     HERNIA REPAIR     TRANSURETHRAL RESECTION OF PROSTATE N/A 02/20/2023   Procedure: TRANSURETHRAL RESECTION OF THE PROSTATE (TURP);  Surgeon: Bjorn Pippin, MD;  Location: [facility];  Service: Urology;  Laterality: N/A;   TYMPANOPLASTY Left 09/15/2016   Procedure: LEFT SIDE TYMPANOPLASTY;  Surgeon: Drema Halon, MD;  Location: [facility];  Service: ENT;  Laterality: Left;    Social History   Socioeconomic History   Marital status: Married    Spouse name: Not on file   Number of children: Not on file   Years of education: Not on file   Highest education level: Not on file  Occupational History   Not on file  Tobacco Use   Smoking status: Never   Smokeless tobacco: Never  Vaping Use   Vaping status: Never Used  Substance and Sexual Activity   Alcohol use: No   Drug use: No   Sexual activity: Not on file  Other Topics Concern   Not on file  Social History Narrative   Not on file   Social Drivers of Health    Financial Resource Strain: Not on file  Food Insecurity: No Food Insecurity (02/20/2023)   Hunger Vital Sign    Worried About Running Out of Food in the Last Year: Never true    Ran Out of Food in the Last Year: Never true  Transportation Needs: No Transportation Needs (02/20/2023)   PRAPARE - Administrator, Civil Service (Medical): No    Lack of Transportation (Non-Medical): No  Physical Activity: Not on file  Stress: Not on file  Social Connections: Not on file  Intimate Partner Violence: Not At Risk (02/20/2023)   Humiliation, Afraid, Rape, and Kick questionnaire    Fear of Current or Ex-Partner: No    Emotionally Abused: No    Physically Abused: No    Sexually Abused: No    No family history on file. Allergies  Allergen Reactions   Elemental Sulfur Anaphylaxis and Swelling    Throat swelling    Sulfamethoxazole Anaphylaxis and Swelling   I? Current Outpatient Medications  Medication Sig Dispense Refill   acetaminophen (TYLENOL) 500 MG tablet Take 1,000 mg by mouth every 6 (six) hours as needed for moderate pain (pain score 4-6).     amLODipine (NORVASC) 10 MG tablet Take 10 mg by mouth daily. In the evening     Ascorbic Acid (VITAMIN C) 1000 MG tablet Take 1,000 mg by mouth daily.  carvedilol (COREG CR) 20 MG 24 hr capsule Take 20 mg by mouth daily.     CINNAMON PO Take 2,000 mg by mouth daily.     cyanocobalamin (VITAMIN B12) 1000 MCG tablet Take 1,000 mcg by mouth daily.     gabapentin (NEURONTIN) 300 MG capsule Take 300-600 mg by mouth See admin instructions. Take 300 mg in the morning and 600 mg at night     losartan-hydrochlorothiazide (HYZAAR) 100-25 MG tablet Take 1 tablet by mouth daily.     methenamine (MANDELAMINE) 1 g tablet Take 1,000 mg by mouth 2 (two) times daily.     Multiple Vitamins-Minerals (MULTIVITAMIN WITH MINERALS) tablet Take 1 tablet by mouth daily.     nitrofurantoin (MACRODANTIN) 50 MG capsule Take 50 mg by mouth at bedtime.      Omega-3 Fatty Acids (FISH OIL) 1000 MG CAPS Take 2,000 mg by mouth daily.     omeprazole (PRILOSEC) 40 MG capsule Take 40 mg by mouth daily.     ondansetron (ZOFRAN-ODT) 4 MG disintegrating tablet Take 1 tablet (4 mg total) by mouth every 8 (eight) hours as needed for nausea or vomiting. 15 tablet 0   rosuvastatin (CRESTOR) 20 MG tablet Take 20 mg by mouth at bedtime.     aspirin 81 MG tablet Take 81 mg by mouth daily. (Patient not taking: Reported on 03/13/2023)     ciprofloxacin (CIPRO) 500 MG tablet Take 1 tablet (500 mg total) by mouth every 12 (twelve) hours. (Patient not taking: Reported on 03/13/2023) 10 tablet 0   No current facility-administered medications for this visit.     Abtx:  Anti-infectives (From admission, onward)    None       REVIEW OF SYSTEMS:  Const: negative fever, negative chills, negative weight loss Eyes: negative diplopia or visual changes, negative eye pain ENT: negative coryza, negative sore throat Resp: negative cough, hemoptysis, dyspnea Cards: negative for chest pain, palpitations, lower extremity edema GU: negative for frequency, dysuria and hematuria GI: Negative for abdominal pain, diarrhea, bleeding, constipation Skin: negative for rash and pruritus Heme: negative for easy bruising and gum/nose bleeding MS: negative for myalgias, arthralgias, back pain and muscle weakness Neurolo:negative for headaches, dizziness, vertigo, memory problems  Psych: negative for feelings of anxiety, depression  Endocrine: negative for thyroid, diabetes Allergy/Immunology- negative for any medication or food allergies ? Pertinent Positives include : Objective:  VITALS:  BP (!) 157/93   Pulse 87   Temp 98.7 F (37.1 C) (Oral)   Ht 5\' 5"  (1.651 m)   Wt 228 lb (103.4 kg)   BMI 37.94 kg/m  LDA Foley Central line Other drainage tubes PHYSICAL EXAM:  General: Alert, cooperative, no distress, appears stated age.  Head: Normocephalic, without obvious  abnormality, atraumatic. Eyes: Conjunctivae clear, anicteric sclerae. Pupils are equal ENT Nares normal. No drainage or sinus tenderness. Lips, mucosa, and tongue normal. No Thrush Neck: Supple, symmetrical, no adenopathy, thyroid: non tender no carotid bruit and no JVD. Back: No CVA tenderness. Lungs: Clear to auscultation bilaterally. No Wheezing or Rhonchi. No rales. Heart: Regular rate and rhythm, no murmur, rub or gallop. Abdomen: Soft, non-tender,not distended. Bowel sounds normal. No masses Extremities: atraumatic, no cyanosis. No edema. No clubbing Skin: No rashes or lesions. Or bruising Lymph: Cervical, supraclavicular normal. Neurologic: Grossly non-focal Pertinent Labs Lab Results CBC    Component Value Date/Time   WBC 9.0 02/25/2023 1027   RBC 5.46 02/25/2023 1027   HGB 16.3 02/25/2023 1027   HCT 50.6 02/25/2023 1027  PLT 151 02/25/2023 1027   MCV 92.7 02/25/2023 1027   MCH 29.9 02/25/2023 1027   MCHC 32.2 02/25/2023 1027   RDW 13.2 02/25/2023 1027   LYMPHSABS 0.7 02/06/2020 0821   MONOABS 1.6 (H) 02/06/2020 0821   EOSABS 0.1 02/06/2020 0821   BASOSABS 0.0 02/06/2020 0821       Latest Ref Rng & Units 02/25/2023    1:00 PM 02/12/2023   11:04 AM 05/05/2021    3:05 AM  CMP  Glucose 70 - 99 mg/dL 161  096  045   BUN 8 - 23 mg/dL 23  20  19    Creatinine 0.61 - 1.24 mg/dL 4.09  8.11  9.14   Sodium 135 - 145 mmol/L 131  138  138   Potassium 3.5 - 5.1 mmol/L 3.3  3.8  3.9   Chloride 98 - 111 mmol/L 93  100  100   CO2 22 - 32 mmol/L 28  26  30    Calcium 8.9 - 10.3 mg/dL 8.4  8.6  8.5   Total Protein 6.5 - 8.1 g/dL 6.7     Total Bilirubin <1.2 mg/dL 0.6     Alkaline Phos 38 - 126 U/L 50     AST 15 - 41 U/L 27     ALT 0 - 44 U/L 36         Microbiology: No results found for this or any previous visit (from the past 240 hours).  IMAGING RESULTS: I have personally reviewed the films ? Impression/Recommendation ? ? ? ___I have personally spent   ---minutes involved in face-to-face and non-face-to-face activities for this patient on the day of the visit. Professional time spent includes the following activities: Preparing to see the patient (review of tests), Obtaining and/or reviewing separately obtained history (admission/discharge record), Performing a medically appropriate examination and/or evaluation , Ordering medications/tests/procedures, referring and communicating with other health care professionals, Documenting clinical information in the EMR, Independently interpreting results (not separately reported), Communicating results to the patient/family/caregiver, Counseling and educating the patient/family/caregiver and Care coordination (not separately reported).    ________________________________________________ Discussed with patient, requesting provider Note:  This document was prepared using Dragon voice recognition software and may include unintentional dictation errors.

## 2023-03-13 NOTE — Progress Notes (Incomplete)
NAME: Martin Hanna  DOB: 1955/10/12  MRN: [id number]  Date/Time: 03/13/2023 12:36 PM   Subjective:    pt is referred to me by Dr.Wrenn urologist Martin Hanna is a 67 y.o. male with a history of ESBL ecoli bacteremia  and complicated uti in 2021 and was treated with IV ertapenem is referred to me for ESBL ecoli in urine now. Pt underwent TURP on 02/20/23 and discharged the next day. HE was taking Nitrofurantion 50mg  BID before that . He presented to ED on 12/1 with difficulty emptying bladder , dysuria and fever   ID   Steroid/immune suppressants/splenectomy/Hardware Recent Procedure Surgery Injections Trauma Sick contacts Travel Antibiotic use Food- raw/exotic Animal bites Tick exposure Water sports Fishing/hunting/animal bird exposure Past Medical History:  Diagnosis Date  . Hypercholesteremia   . Hypertension   . Neuropathy   . PONV (postoperative nausea and vomiting)   . Pre-diabetes   . Seizures (HCC)    x34 at 67 years old; none before or since that time  . Sleep apnea     Past Surgical History:  Procedure Laterality Date  . APPENDECTOMY    . CHOLECYSTECTOMY    . COLONOSCOPY N/A 02/12/2015   Procedure: COLONOSCOPY;  Surgeon: Scot Jun, MD;  Location: [facility] ENDOSCOPY;  Service: Endoscopy;  Laterality: N/A;  . GANGLION CYST EXCISION    . HERNIA REPAIR    . TRANSURETHRAL RESECTION OF PROSTATE N/A 02/20/2023   Procedure: TRANSURETHRAL RESECTION OF THE PROSTATE (TURP);  Surgeon: Bjorn Pippin, MD;  Location: [facility];  Service: Urology;  Laterality: N/A;  . TYMPANOPLASTY Left 09/15/2016   Procedure: LEFT SIDE TYMPANOPLASTY;  Surgeon: Drema Halon, MD;  Location: [facility];  Service: ENT;  Laterality: Left;    Social History   Socioeconomic History  . Marital status: Married    Spouse name: Not on file  . Number of children: Not on file  . Years of education: Not on file  . Highest education level: Not on file  Occupational  History  . Not on file  Tobacco Use  . Smoking status: Never  . Smokeless tobacco: Never  Vaping Use  . Vaping status: Never Used  Substance and Sexual Activity  . Alcohol use: No  . Drug use: No  . Sexual activity: Not on file  Other Topics Concern  . Not on file  Social History Narrative  . Not on file   Social Drivers of Health   Financial Resource Strain: Not on file  Food Insecurity: No Food Insecurity (02/20/2023)   Hunger Vital Sign   . Worried About Programme researcher, broadcasting/film/video in the Last Year: Never true   . Ran Out of Food in the Last Year: Never true  Transportation Needs: No Transportation Needs (02/20/2023)   PRAPARE - Transportation   . Lack of Transportation (Medical): No   . Lack of Transportation (Non-Medical): No  Physical Activity: Not on file  Stress: Not on file  Social Connections: Not on file  Intimate Partner Violence: Not At Risk (02/20/2023)   Humiliation, Afraid, Rape, and Kick questionnaire   . Fear of Current or Ex-Partner: No   . Emotionally Abused: No   . Physically Abused: No   . Sexually Abused: No    No family history on file. Allergies  Allergen Reactions  . Elemental Sulfur Anaphylaxis and Swelling    Throat swelling   . Sulfamethoxazole Anaphylaxis and Swelling   I  Current Outpatient Medications  Medication Sig Dispense  Refill  . acetaminophen (TYLENOL) 500 MG tablet Take 1,000 mg by mouth every 6 (six) hours as needed for moderate pain (pain score 4-6).    Marland Kitchen amLODipine (NORVASC) 10 MG tablet Take 10 mg by mouth daily. In the evening    . Ascorbic Acid (VITAMIN C) 1000 MG tablet Take 1,000 mg by mouth daily.    . carvedilol (COREG CR) 20 MG 24 hr capsule Take 20 mg by mouth daily.    Marland Kitchen CINNAMON PO Take 2,000 mg by mouth daily.    . cyanocobalamin (VITAMIN B12) 1000 MCG tablet Take 1,000 mcg by mouth daily.    Marland Kitchen gabapentin (NEURONTIN) 300 MG capsule Take 300-600 mg by mouth See admin instructions. Take 300 mg in the morning and 600 mg  at night    . losartan-hydrochlorothiazide (HYZAAR) 100-25 MG tablet Take 1 tablet by mouth daily.    . methenamine (MANDELAMINE) 1 g tablet Take 1,000 mg by mouth 2 (two) times daily.    . Multiple Vitamins-Minerals (MULTIVITAMIN WITH MINERALS) tablet Take 1 tablet by mouth daily.    . nitrofurantoin (MACRODANTIN) 50 MG capsule Take 50 mg by mouth at bedtime.    . Omega-3 Fatty Acids (FISH OIL) 1000 MG CAPS Take 2,000 mg by mouth daily.    Marland Kitchen omeprazole (PRILOSEC) 40 MG capsule Take 40 mg by mouth daily.    . ondansetron (ZOFRAN-ODT) 4 MG disintegrating tablet Take 1 tablet (4 mg total) by mouth every 8 (eight) hours as needed for nausea or vomiting. 15 tablet 0  . rosuvastatin (CRESTOR) 20 MG tablet Take 20 mg by mouth at bedtime.    Marland Kitchen aspirin 81 MG tablet Take 81 mg by mouth daily. (Patient not taking: Reported on 03/13/2023)    . ciprofloxacin (CIPRO) 500 MG tablet Take 1 tablet (500 mg total) by mouth every 12 (twelve) hours. (Patient not taking: Reported on 03/13/2023) 10 tablet 0   No current facility-administered medications for this visit.     Abtx:  Anti-infectives (From admission, onward)    None       REVIEW OF SYSTEMS:  Const: negative fever, negative chills, negative weight loss Eyes: negative diplopia or visual changes, negative eye pain ENT: negative coryza, negative sore throat Resp: negative cough, hemoptysis, dyspnea Cards: negative for chest pain, palpitations, lower extremity edema GU: negative for frequency, dysuria and hematuria GI: Negative for abdominal pain, diarrhea, bleeding, constipation Skin: negative for rash and pruritus Heme: negative for easy bruising and gum/nose bleeding MS: negative for myalgias, arthralgias, back pain and muscle weakness Neurolo:negative for headaches, dizziness, vertigo, memory problems  Psych: negative for feelings of anxiety, depression  Endocrine: negative for thyroid, diabetes Allergy/Immunology- negative for any  medication or food allergies   Pertinent Positives include : Objective:  VITALS:  BP (!) 157/93   Pulse 87   Temp 98.7 F (37.1 C) (Oral)   Ht 5\' 5"  (1.651 m)   Wt 228 lb (103.4 kg)   BMI 37.94 kg/m  LDA Foley Central line Other drainage tubes PHYSICAL EXAM:  General: Alert, cooperative, no distress, appears stated age.  Head: Normocephalic, without obvious abnormality, atraumatic. Eyes: Conjunctivae clear, anicteric sclerae. Pupils are equal ENT Nares normal. No drainage or sinus tenderness. Lips, mucosa, and tongue normal. No Thrush Neck: Supple, symmetrical, no adenopathy, thyroid: non tender no carotid bruit and no JVD. Back: No CVA tenderness. Lungs: Clear to auscultation bilaterally. No Wheezing or Rhonchi. No rales. Heart: Regular rate and rhythm, no murmur, rub or gallop. Abdomen:  Soft, non-tender,not distended. Bowel sounds normal. No masses Extremities: atraumatic, no cyanosis. No edema. No clubbing Skin: No rashes or lesions. Or bruising Lymph: Cervical, supraclavicular normal. Neurologic: Grossly non-focal Pertinent Labs Lab Results CBC    Component Value Date/Time   WBC 9.0 02/25/2023 1027   RBC 5.46 02/25/2023 1027   HGB 16.3 02/25/2023 1027   HCT 50.6 02/25/2023 1027   PLT 151 02/25/2023 1027   MCV 92.7 02/25/2023 1027   MCH 29.9 02/25/2023 1027   MCHC 32.2 02/25/2023 1027   RDW 13.2 02/25/2023 1027   LYMPHSABS 0.7 02/06/2020 0821   MONOABS 1.6 (H) 02/06/2020 0821   EOSABS 0.1 02/06/2020 0821   BASOSABS 0.0 02/06/2020 0821       Latest Ref Rng & Units 02/25/2023    1:00 PM 02/12/2023   11:04 AM 05/05/2021    3:05 AM  CMP  Glucose 70 - 99 mg/dL 284  132  440   BUN 8 - 23 mg/dL 23  20  19    Creatinine 0.61 - 1.24 mg/dL 1.02  7.25  3.66   Sodium 135 - 145 mmol/L 131  138  138   Potassium 3.5 - 5.1 mmol/L 3.3  3.8  3.9   Chloride 98 - 111 mmol/L 93  100  100   CO2 22 - 32 mmol/L 28  26  30    Calcium 8.9 - 10.3 mg/dL 8.4  8.6  8.5   Total  Protein 6.5 - 8.1 g/dL 6.7     Total Bilirubin <1.2 mg/dL 0.6     Alkaline Phos 38 - 126 U/L 50     AST 15 - 41 U/L 27     ALT 0 - 44 U/L 36         Microbiology: No results found for this or any previous visit (from the past 240 hours).  IMAGING RESULTS: I have personally reviewed the films   Impression/Recommendation       ___I have personally spent  ---minutes involved in face-to-face and non-face-to-face activities for this patient on the day of the visit. Professional time spent includes the following activities: Preparing to see the patient (review of tests), Obtaining and/or reviewing separately obtained history (admission/discharge record), Performing a medically appropriate examination and/or evaluation , Ordering medications/tests/procedures, referring and communicating with other health care professionals, Documenting clinical information in the EMR, Independently interpreting results (not separately reported), Communicating results to the patient/family/caregiver, Counseling and educating the patient/family/caregiver and Care coordination (not separately reported).    ________________________________________________ Discussed with patient, requesting provider Note:  This document was prepared using Dragon voice recognition software and may include unintentional dictation errors.

## 2023-04-17 DIAGNOSIS — N3020 Other chronic cystitis without hematuria: Secondary | ICD-10-CM | POA: Diagnosis not present

## 2023-04-17 DIAGNOSIS — R3912 Poor urinary stream: Secondary | ICD-10-CM | POA: Diagnosis not present

## 2023-04-17 DIAGNOSIS — T8484XA Pain due to internal orthopedic prosthetic devices, implants and grafts, initial encounter: Secondary | ICD-10-CM | POA: Diagnosis not present

## 2023-04-17 DIAGNOSIS — N401 Enlarged prostate with lower urinary tract symptoms: Secondary | ICD-10-CM | POA: Diagnosis not present

## 2023-04-17 DIAGNOSIS — M1712 Unilateral primary osteoarthritis, left knee: Secondary | ICD-10-CM | POA: Diagnosis not present

## 2023-04-18 DIAGNOSIS — M25462 Effusion, left knee: Secondary | ICD-10-CM | POA: Diagnosis not present

## 2023-04-23 DIAGNOSIS — E669 Obesity, unspecified: Secondary | ICD-10-CM | POA: Diagnosis not present

## 2023-04-23 DIAGNOSIS — Z87440 Personal history of urinary (tract) infections: Secondary | ICD-10-CM | POA: Diagnosis not present

## 2023-04-23 DIAGNOSIS — M00862 Arthritis due to other bacteria, left knee: Secondary | ICD-10-CM | POA: Diagnosis not present

## 2023-04-23 DIAGNOSIS — E785 Hyperlipidemia, unspecified: Secondary | ICD-10-CM | POA: Diagnosis not present

## 2023-04-23 DIAGNOSIS — E119 Type 2 diabetes mellitus without complications: Secondary | ICD-10-CM | POA: Diagnosis not present

## 2023-04-23 DIAGNOSIS — Z6838 Body mass index (BMI) 38.0-38.9, adult: Secondary | ICD-10-CM | POA: Diagnosis not present

## 2023-04-23 DIAGNOSIS — Z882 Allergy status to sulfonamides status: Secondary | ICD-10-CM | POA: Diagnosis not present

## 2023-04-23 DIAGNOSIS — K219 Gastro-esophageal reflux disease without esophagitis: Secondary | ICD-10-CM | POA: Diagnosis not present

## 2023-04-23 DIAGNOSIS — Z79899 Other long term (current) drug therapy: Secondary | ICD-10-CM | POA: Diagnosis not present

## 2023-04-23 DIAGNOSIS — T8454XA Infection and inflammatory reaction due to internal left knee prosthesis, initial encounter: Secondary | ICD-10-CM | POA: Diagnosis not present

## 2023-04-23 DIAGNOSIS — M25562 Pain in left knee: Secondary | ICD-10-CM | POA: Diagnosis not present

## 2023-04-23 DIAGNOSIS — I10 Essential (primary) hypertension: Secondary | ICD-10-CM | POA: Diagnosis not present

## 2023-04-23 DIAGNOSIS — Z96652 Presence of left artificial knee joint: Secondary | ICD-10-CM | POA: Diagnosis not present

## 2023-04-23 DIAGNOSIS — J449 Chronic obstructive pulmonary disease, unspecified: Secondary | ICD-10-CM | POA: Diagnosis not present

## 2023-04-23 DIAGNOSIS — Z452 Encounter for adjustment and management of vascular access device: Secondary | ICD-10-CM | POA: Diagnosis not present

## 2023-04-23 DIAGNOSIS — B999 Unspecified infectious disease: Secondary | ICD-10-CM | POA: Diagnosis not present

## 2023-04-23 DIAGNOSIS — N400 Enlarged prostate without lower urinary tract symptoms: Secondary | ICD-10-CM | POA: Diagnosis not present

## 2023-04-23 DIAGNOSIS — B952 Enterococcus as the cause of diseases classified elsewhere: Secondary | ICD-10-CM | POA: Diagnosis not present

## 2023-04-28 DIAGNOSIS — Z79899 Other long term (current) drug therapy: Secondary | ICD-10-CM | POA: Diagnosis not present

## 2023-04-28 DIAGNOSIS — I700 Atherosclerosis of aorta: Secondary | ICD-10-CM | POA: Diagnosis not present

## 2023-04-28 DIAGNOSIS — M5416 Radiculopathy, lumbar region: Secondary | ICD-10-CM | POA: Diagnosis not present

## 2023-04-28 DIAGNOSIS — Z7982 Long term (current) use of aspirin: Secondary | ICD-10-CM | POA: Diagnosis not present

## 2023-04-28 DIAGNOSIS — B952 Enterococcus as the cause of diseases classified elsewhere: Secondary | ICD-10-CM | POA: Diagnosis not present

## 2023-04-28 DIAGNOSIS — G4733 Obstructive sleep apnea (adult) (pediatric): Secondary | ICD-10-CM | POA: Diagnosis not present

## 2023-04-28 DIAGNOSIS — E1142 Type 2 diabetes mellitus with diabetic polyneuropathy: Secondary | ICD-10-CM | POA: Diagnosis not present

## 2023-04-28 DIAGNOSIS — E7800 Pure hypercholesterolemia, unspecified: Secondary | ICD-10-CM | POA: Diagnosis not present

## 2023-04-28 DIAGNOSIS — Z452 Encounter for adjustment and management of vascular access device: Secondary | ICD-10-CM | POA: Diagnosis not present

## 2023-04-28 DIAGNOSIS — Z9181 History of falling: Secondary | ICD-10-CM | POA: Diagnosis not present

## 2023-04-28 DIAGNOSIS — E1169 Type 2 diabetes mellitus with other specified complication: Secondary | ICD-10-CM | POA: Diagnosis not present

## 2023-04-28 DIAGNOSIS — I10 Essential (primary) hypertension: Secondary | ICD-10-CM | POA: Diagnosis not present

## 2023-04-28 DIAGNOSIS — Z6839 Body mass index (BMI) 39.0-39.9, adult: Secondary | ICD-10-CM | POA: Diagnosis not present

## 2023-04-28 DIAGNOSIS — E559 Vitamin D deficiency, unspecified: Secondary | ICD-10-CM | POA: Diagnosis not present

## 2023-04-28 DIAGNOSIS — I2510 Atherosclerotic heart disease of native coronary artery without angina pectoris: Secondary | ICD-10-CM | POA: Diagnosis not present

## 2023-04-28 DIAGNOSIS — M1711 Unilateral primary osteoarthritis, right knee: Secondary | ICD-10-CM | POA: Diagnosis not present

## 2023-04-28 DIAGNOSIS — M720 Palmar fascial fibromatosis [Dupuytren]: Secondary | ICD-10-CM | POA: Diagnosis not present

## 2023-04-28 DIAGNOSIS — K219 Gastro-esophageal reflux disease without esophagitis: Secondary | ICD-10-CM | POA: Diagnosis not present

## 2023-04-28 DIAGNOSIS — N400 Enlarged prostate without lower urinary tract symptoms: Secondary | ICD-10-CM | POA: Diagnosis not present

## 2023-04-28 DIAGNOSIS — Z792 Long term (current) use of antibiotics: Secondary | ICD-10-CM | POA: Diagnosis not present

## 2023-04-28 DIAGNOSIS — G8929 Other chronic pain: Secondary | ICD-10-CM | POA: Diagnosis not present

## 2023-04-28 DIAGNOSIS — T8454XA Infection and inflammatory reaction due to internal left knee prosthesis, initial encounter: Secondary | ICD-10-CM | POA: Diagnosis not present

## 2023-04-30 DIAGNOSIS — Z6839 Body mass index (BMI) 39.0-39.9, adult: Secondary | ICD-10-CM | POA: Diagnosis not present

## 2023-04-30 DIAGNOSIS — K219 Gastro-esophageal reflux disease without esophagitis: Secondary | ICD-10-CM | POA: Diagnosis not present

## 2023-04-30 DIAGNOSIS — E559 Vitamin D deficiency, unspecified: Secondary | ICD-10-CM | POA: Diagnosis not present

## 2023-04-30 DIAGNOSIS — I2510 Atherosclerotic heart disease of native coronary artery without angina pectoris: Secondary | ICD-10-CM | POA: Diagnosis not present

## 2023-04-30 DIAGNOSIS — M1711 Unilateral primary osteoarthritis, right knee: Secondary | ICD-10-CM | POA: Diagnosis not present

## 2023-04-30 DIAGNOSIS — M720 Palmar fascial fibromatosis [Dupuytren]: Secondary | ICD-10-CM | POA: Diagnosis not present

## 2023-04-30 DIAGNOSIS — Z79899 Other long term (current) drug therapy: Secondary | ICD-10-CM | POA: Diagnosis not present

## 2023-04-30 DIAGNOSIS — I700 Atherosclerosis of aorta: Secondary | ICD-10-CM | POA: Diagnosis not present

## 2023-04-30 DIAGNOSIS — G4733 Obstructive sleep apnea (adult) (pediatric): Secondary | ICD-10-CM | POA: Diagnosis not present

## 2023-04-30 DIAGNOSIS — G8929 Other chronic pain: Secondary | ICD-10-CM | POA: Diagnosis not present

## 2023-04-30 DIAGNOSIS — Z792 Long term (current) use of antibiotics: Secondary | ICD-10-CM | POA: Diagnosis not present

## 2023-04-30 DIAGNOSIS — E1142 Type 2 diabetes mellitus with diabetic polyneuropathy: Secondary | ICD-10-CM | POA: Diagnosis not present

## 2023-04-30 DIAGNOSIS — N400 Enlarged prostate without lower urinary tract symptoms: Secondary | ICD-10-CM | POA: Diagnosis not present

## 2023-04-30 DIAGNOSIS — Z452 Encounter for adjustment and management of vascular access device: Secondary | ICD-10-CM | POA: Diagnosis not present

## 2023-04-30 DIAGNOSIS — I10 Essential (primary) hypertension: Secondary | ICD-10-CM | POA: Diagnosis not present

## 2023-04-30 DIAGNOSIS — B952 Enterococcus as the cause of diseases classified elsewhere: Secondary | ICD-10-CM | POA: Diagnosis not present

## 2023-04-30 DIAGNOSIS — E1169 Type 2 diabetes mellitus with other specified complication: Secondary | ICD-10-CM | POA: Diagnosis not present

## 2023-04-30 DIAGNOSIS — Z7982 Long term (current) use of aspirin: Secondary | ICD-10-CM | POA: Diagnosis not present

## 2023-04-30 DIAGNOSIS — M5416 Radiculopathy, lumbar region: Secondary | ICD-10-CM | POA: Diagnosis not present

## 2023-04-30 DIAGNOSIS — T8454XA Infection and inflammatory reaction due to internal left knee prosthesis, initial encounter: Secondary | ICD-10-CM | POA: Diagnosis not present

## 2023-04-30 DIAGNOSIS — E7800 Pure hypercholesterolemia, unspecified: Secondary | ICD-10-CM | POA: Diagnosis not present

## 2023-04-30 DIAGNOSIS — Z9181 History of falling: Secondary | ICD-10-CM | POA: Diagnosis not present

## 2023-05-01 ENCOUNTER — Ambulatory Visit: Payer: Medicare HMO | Attending: Infectious Diseases | Admitting: Infectious Diseases

## 2023-05-01 ENCOUNTER — Encounter: Payer: Self-pay | Admitting: Infectious Diseases

## 2023-05-01 VITALS — BP 134/84 | HR 81 | Temp 97.7°F

## 2023-05-01 DIAGNOSIS — B952 Enterococcus as the cause of diseases classified elsewhere: Secondary | ICD-10-CM | POA: Insufficient documentation

## 2023-05-01 DIAGNOSIS — Z6839 Body mass index (BMI) 39.0-39.9, adult: Secondary | ICD-10-CM | POA: Diagnosis not present

## 2023-05-01 DIAGNOSIS — N401 Enlarged prostate with lower urinary tract symptoms: Secondary | ICD-10-CM

## 2023-05-01 DIAGNOSIS — G8929 Other chronic pain: Secondary | ICD-10-CM | POA: Diagnosis not present

## 2023-05-01 DIAGNOSIS — Z9181 History of falling: Secondary | ICD-10-CM | POA: Diagnosis not present

## 2023-05-01 DIAGNOSIS — T8454XA Infection and inflammatory reaction due to internal left knee prosthesis, initial encounter: Secondary | ICD-10-CM | POA: Insufficient documentation

## 2023-05-01 DIAGNOSIS — Z7982 Long term (current) use of aspirin: Secondary | ICD-10-CM | POA: Diagnosis not present

## 2023-05-01 DIAGNOSIS — Z792 Long term (current) use of antibiotics: Secondary | ICD-10-CM | POA: Diagnosis not present

## 2023-05-01 DIAGNOSIS — Z9049 Acquired absence of other specified parts of digestive tract: Secondary | ICD-10-CM | POA: Insufficient documentation

## 2023-05-01 DIAGNOSIS — A327 Listerial sepsis: Secondary | ICD-10-CM | POA: Diagnosis not present

## 2023-05-01 DIAGNOSIS — T8454XD Infection and inflammatory reaction due to internal left knee prosthesis, subsequent encounter: Secondary | ICD-10-CM | POA: Diagnosis not present

## 2023-05-01 DIAGNOSIS — E559 Vitamin D deficiency, unspecified: Secondary | ICD-10-CM | POA: Diagnosis not present

## 2023-05-01 DIAGNOSIS — N390 Urinary tract infection, site not specified: Secondary | ICD-10-CM

## 2023-05-01 DIAGNOSIS — R7881 Bacteremia: Secondary | ICD-10-CM | POA: Diagnosis not present

## 2023-05-01 DIAGNOSIS — Z1612 Extended spectrum beta lactamase (ESBL) resistance: Secondary | ICD-10-CM

## 2023-05-01 DIAGNOSIS — M25462 Effusion, left knee: Secondary | ICD-10-CM | POA: Diagnosis not present

## 2023-05-01 DIAGNOSIS — I2510 Atherosclerotic heart disease of native coronary artery without angina pectoris: Secondary | ICD-10-CM | POA: Diagnosis not present

## 2023-05-01 DIAGNOSIS — Z87440 Personal history of urinary (tract) infections: Secondary | ICD-10-CM | POA: Diagnosis not present

## 2023-05-01 DIAGNOSIS — B9620 Unspecified Escherichia coli [E. coli] as the cause of diseases classified elsewhere: Secondary | ICD-10-CM

## 2023-05-01 DIAGNOSIS — Z452 Encounter for adjustment and management of vascular access device: Secondary | ICD-10-CM | POA: Diagnosis not present

## 2023-05-01 DIAGNOSIS — Y838 Other surgical procedures as the cause of abnormal reaction of the patient, or of later complication, without mention of misadventure at the time of the procedure: Secondary | ICD-10-CM | POA: Insufficient documentation

## 2023-05-01 DIAGNOSIS — E1169 Type 2 diabetes mellitus with other specified complication: Secondary | ICD-10-CM | POA: Diagnosis not present

## 2023-05-01 DIAGNOSIS — M5416 Radiculopathy, lumbar region: Secondary | ICD-10-CM | POA: Diagnosis not present

## 2023-05-01 DIAGNOSIS — Y798 Miscellaneous orthopedic devices associated with adverse incidents, not elsewhere classified: Secondary | ICD-10-CM | POA: Insufficient documentation

## 2023-05-01 DIAGNOSIS — N400 Enlarged prostate without lower urinary tract symptoms: Secondary | ICD-10-CM | POA: Insufficient documentation

## 2023-05-01 DIAGNOSIS — K219 Gastro-esophageal reflux disease without esophagitis: Secondary | ICD-10-CM | POA: Diagnosis not present

## 2023-05-01 DIAGNOSIS — E7800 Pure hypercholesterolemia, unspecified: Secondary | ICD-10-CM | POA: Diagnosis not present

## 2023-05-01 DIAGNOSIS — I10 Essential (primary) hypertension: Secondary | ICD-10-CM | POA: Diagnosis not present

## 2023-05-01 DIAGNOSIS — M720 Palmar fascial fibromatosis [Dupuytren]: Secondary | ICD-10-CM | POA: Diagnosis not present

## 2023-05-01 DIAGNOSIS — G4733 Obstructive sleep apnea (adult) (pediatric): Secondary | ICD-10-CM | POA: Diagnosis not present

## 2023-05-01 DIAGNOSIS — Z79899 Other long term (current) drug therapy: Secondary | ICD-10-CM | POA: Diagnosis not present

## 2023-05-01 DIAGNOSIS — E1142 Type 2 diabetes mellitus with diabetic polyneuropathy: Secondary | ICD-10-CM | POA: Diagnosis not present

## 2023-05-01 DIAGNOSIS — M1711 Unilateral primary osteoarthritis, right knee: Secondary | ICD-10-CM | POA: Diagnosis not present

## 2023-05-01 DIAGNOSIS — A419 Sepsis, unspecified organism: Secondary | ICD-10-CM | POA: Diagnosis not present

## 2023-05-01 DIAGNOSIS — M1712 Unilateral primary osteoarthritis, left knee: Secondary | ICD-10-CM | POA: Diagnosis not present

## 2023-05-01 DIAGNOSIS — I700 Atherosclerosis of aorta: Secondary | ICD-10-CM | POA: Diagnosis not present

## 2023-05-01 NOTE — Progress Notes (Signed)
 NAME: Martin Hanna  DOB: 12-Dec-1955  MRN: [id number]  Date/Time: 05/01/2023 10:16 AM   Subjective:   ?Pt is here for a new problem-He has left knee PJI with enterococcus faecalis 68 year old male with history of ESBL E. coli bacteremia and complicated UTI in 2021 treated with IV or ertapenem , BPH, TURP on 02/20/2023, recent urine culture in December was ESBL E. 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 Larnell.  I had seen him in December 2024 for the year.  E. coli in the urine.  Pt underwent TURP on 02/20/23 and discharged the next day. HE was taking Nitrofurantion 50mg  BID  . He presented to ED on 12/1 with difficulty emptying bladder , dysuria and fever UC sent and he was discharged home on PO cipro .On 12/5 when the culture finalized on 03/01/23 he was sent Macrobid  100 mg Bid for 7days. Dr.Wrenn has asked him to continue macrobid  100mg  every day for 3 months.   he was sent to me on 03/13/2023.He was doing much better at that visit no dysuria, frequency, nocturia, fever etc In addition to his urinary symptoms, the patient also mentioned some discomfort in his knee, . He was worried about knee getting infected .He denied any fever, chills, or night sweats. He has been using an ice machine for relief. His knee was examined by his orthopedic surgeon recently, who found no issues with the replacement. On 03/13/2023 he did not need any antibiotics for his urine E. coli.  And I told him to follow-up with me as needed. Patient then saw his orthopedic surgeon on 04/18/2023 for increasing left knee pain and underwent aspiration of the knee and test that indicated infection with Enterococcus faecalis.  He was admitted to [facility] and underwent surgery and removal of the entire hardware and placement of antibiotic spacer on 04/23/2023 The cultures came back as Enterococcus faecalis susceptible to ampicillin. Patient had a PICC placement and was  sent home on ampicillin IV to follow-up with infectious disease. He was referred to [facility] his physician but as he had seen me earlier he chose to come to [place].    Patient since surgery was doing well.  Today he came from [facility] in the car and felt some jolting in the left knee and has got some pain now No fever He is walking with a walker His wife does the continuous ampicillin infusion 12 grams every 24 hours He has a home nurse visiting him twice a week.   Past Medical History:  Diagnosis Date   Hypercholesteremia    Hypertension    Neuropathy    PONV (postoperative nausea and vomiting)    Pre-diabetes    Seizures (HCC)    x59 at 68 years old; none before or since that time   Sleep apnea     Past Surgical History:  Procedure Laterality Date   APPENDECTOMY     CHOLECYSTECTOMY     COLONOSCOPY N/A 02/12/2015   Procedure: COLONOSCOPY;  Surgeon: Lamar ONEIDA Holmes, MD;  Location: [facility] ENDOSCOPY;  Service: Endoscopy;  Laterality: N/A;   GANGLION CYST EXCISION     HERNIA REPAIR     TRANSURETHRAL RESECTION OF PROSTATE N/A 02/20/2023   Procedure: TRANSURETHRAL RESECTION OF THE PROSTATE (TURP);  Surgeon: Watt Rush, MD;  Location: [facility];  Service: Urology;  Laterality: N/A;   TYMPANOPLASTY Left 09/15/2016   Procedure: LEFT SIDE TYMPANOPLASTY;  Surgeon: Ethyl Lonni BRAVO, MD;  Location: [facility];  Service: ENT;  Laterality: Left;    Social History   Socioeconomic History   Marital status: Married    Spouse name: Not on file   Number of children: Not on file   Years of education: Not on file   Highest education level: Not on file  Occupational History   Not on file  Tobacco Use   Smoking status: Never   Smokeless tobacco: Never  Vaping Use   Vaping status: Never Used  Substance and Sexual Activity   Alcohol use: No   Drug use: No   Sexual activity: Not on file  Other Topics Concern   Not on file  Social History Narrative   Not on file    Social Drivers of Health   Financial Resource Strain: Not on file  Food Insecurity: No Food Insecurity (02/20/2023)   Hunger Vital Sign    Worried About Running Out of Food in the Last Year: Never true    Ran Out of Food in the Last Year: Never true  Transportation Needs: No Transportation Needs (02/20/2023)   PRAPARE - Administrator, Civil Service (Medical): No    Lack of Transportation (Non-Medical): No  Physical Activity: Not on file  Stress: Not on file  Social Connections: Not on file  Intimate Partner Violence: Not At Risk (02/20/2023)   Humiliation, Afraid, Rape, and Kick questionnaire    Fear of Current or Ex-Partner: No    Emotionally Abused: No    Physically Abused: No    Sexually Abused: No    No family history on file. Allergies  Allergen Reactions   Elemental Sulfur Anaphylaxis and Swelling    Throat swelling    Sulfamethoxazole Anaphylaxis and Swelling   I? Current Outpatient Medications  Medication Sig Dispense Refill   acetaminophen  (TYLENOL ) 500 MG tablet Take 1,000 mg by mouth every 6 (six) hours as needed for moderate pain (pain score 4-6).     amLODipine  (NORVASC ) 10 MG tablet Take 10 mg by mouth daily. In the evening     Ampicillin Sodium 10 g SOLR      Ascorbic Acid (VITAMIN C) 1000 MG tablet Take 1,000 mg by mouth daily.     aspirin  81 MG tablet Take 81 mg by mouth daily.     carvedilol  (COREG  CR) 20 MG 24 hr capsule Take 20 mg by mouth daily.     CINNAMON PO Take 2,000 mg by mouth daily.     cyanocobalamin  (VITAMIN B12) 1000 MCG tablet Take 1,000 mcg by mouth daily.     gabapentin  (NEURONTIN ) 300 MG capsule Take 300-600 mg by mouth See admin instructions. Take 300 mg in the morning and 600 mg at night     losartan -hydrochlorothiazide  (HYZAAR) 100-25 MG tablet Take 1 tablet by mouth daily.     methenamine  (MANDELAMINE) 1 g tablet Take 1,000 mg by mouth 2 (two) times daily.     Multiple Vitamins-Minerals (MULTIVITAMIN WITH MINERALS)  tablet Take 1 tablet by mouth daily.     Omega-3 Fatty Acids (FISH OIL) 1000 MG CAPS Take 2,000 mg by mouth daily.     omeprazole (PRILOSEC) 40 MG capsule Take 40 mg by mouth daily.     ondansetron  (ZOFRAN -ODT) 4 MG disintegrating tablet Take 1 tablet (4 mg total) by mouth every 8 (eight) hours as needed for nausea or vomiting. 15 tablet 0   oxyCODONE  (OXY IR/ROXICODONE ) 5 MG immediate release tablet Take 5 mg by mouth every 4 (four) hours as  needed.     rosuvastatin  (CRESTOR ) 20 MG tablet Take 20 mg by mouth at bedtime.     No current facility-administered medications for this visit.     Abtx:  Anti-infectives (From admission, onward)    None       REVIEW OF SYSTEMS:  Const: negative fever, negative chills, negative weight loss Eyes: negative diplopia or visual changes, negative eye pain ENT: negative coryza, negative sore throat Resp: negative cough, hemoptysis, dyspnea Cards: negative for chest pain, palpitations, lower extremity edema GU: Had nocturia and increased frequency after surgery but none today.  GI: Negative for abdominal pain, diarrhea, bleeding, constipation Skin: negative for rash and pruritus Heme: negative for easy bruising and gum/nose bleeding MS: left knee pain Neurolo:negative for headaches, dizziness, vertigo, memory problems  Psych: negative for feelings of anxiety, depression  Endocrine: negative for thyroid , diabetes Allergy/Immunology- as above: Objective:  VITALS:  BP 134/84   Pulse 81   Temp 97.7 F (36.5 C) (Temporal)    PHYSICAL EXAM:  General: Alert, cooperative, no distress, appears stated age.  Head: Normocephalic, without obvious abnormality, atraumatic. Eyes: Conjunctivae clear, anicteric sclerae. Pupils are equal ENT Nares normal. No drainage or sinus tenderness. Lips, mucosa, and tongue normal. No Thrush Neck: Supple, symmetrical, no adenopathy, thyroid : non tender no carotid bruit and no JVD. Back: No CVA tenderness. Lungs: Clear  to auscultation bilaterally. No Wheezing or Rhonchi. No rales. Heart: Regular rate and rhythm, no murmur, rub or gallop. Abdomen: Soft, non-tender,not distended. Bowel sounds normal. No masses Extremities: left knee surgical dressing not removed Right PICC line Skin: No rashes or lesions. Or bruising Lymph: Cervical, supraclavicular normal. 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  He will need 6 weeks of IV ampicillin until 06/14/23 and will need p.o. (amoxicillin  after that) after that  This was not the bacteria that was present in his urine in December 2024.  That was E. coli  TURP for BPH  Cystitis/incomplete emptying following TURP which had resolved Was on nitrofurantoin .  Not anymore  ESBL ecoli in urine- pt is colonized with this since 2021 Was a symptomatic in December and was not treated We will send urine culture if he becomes symptomatic.    Discussed the management with patient and his wife Called and spoke to Dr.Yaste his orthopedic surgeon ? ?Follow 6 weeks ____________

## 2023-05-01 NOTE — Patient Instructions (Addendum)
You are here for left knee prosthetic joint infection with enterococcus fecalis- you are on ampicillin IV- you are followed by DR Deberah Castle and I spoke to him- will follow up in 6 weeks. Mid stream clean catch Urine culture if you have symptoms

## 2023-05-02 ENCOUNTER — Other Ambulatory Visit
Admission: RE | Admit: 2023-05-02 | Discharge: 2023-05-02 | Disposition: A | Discharge status: Home / Self Care | Payer: Medicare HMO | Source: Ambulatory Visit | Attending: Infectious Diseases | Admitting: Infectious Diseases

## 2023-05-02 DIAGNOSIS — N390 Urinary tract infection, site not specified: Secondary | ICD-10-CM | POA: Insufficient documentation

## 2023-05-02 DIAGNOSIS — N401 Enlarged prostate with lower urinary tract symptoms: Secondary | ICD-10-CM | POA: Insufficient documentation

## 2023-05-02 DIAGNOSIS — R350 Frequency of micturition: Secondary | ICD-10-CM | POA: Diagnosis not present

## 2023-05-03 DIAGNOSIS — Z7982 Long term (current) use of aspirin: Secondary | ICD-10-CM | POA: Diagnosis not present

## 2023-05-03 DIAGNOSIS — M1712 Unilateral primary osteoarthritis, left knee: Secondary | ICD-10-CM | POA: Diagnosis not present

## 2023-05-03 DIAGNOSIS — N390 Urinary tract infection, site not specified: Secondary | ICD-10-CM | POA: Diagnosis not present

## 2023-05-03 DIAGNOSIS — G4733 Obstructive sleep apnea (adult) (pediatric): Secondary | ICD-10-CM | POA: Diagnosis not present

## 2023-05-03 DIAGNOSIS — K219 Gastro-esophageal reflux disease without esophagitis: Secondary | ICD-10-CM | POA: Diagnosis not present

## 2023-05-03 DIAGNOSIS — Z9181 History of falling: Secondary | ICD-10-CM | POA: Diagnosis not present

## 2023-05-03 DIAGNOSIS — M720 Palmar fascial fibromatosis [Dupuytren]: Secondary | ICD-10-CM | POA: Diagnosis not present

## 2023-05-03 DIAGNOSIS — Z6839 Body mass index (BMI) 39.0-39.9, adult: Secondary | ICD-10-CM | POA: Diagnosis not present

## 2023-05-03 DIAGNOSIS — M1711 Unilateral primary osteoarthritis, right knee: Secondary | ICD-10-CM | POA: Diagnosis not present

## 2023-05-03 DIAGNOSIS — E1142 Type 2 diabetes mellitus with diabetic polyneuropathy: Secondary | ICD-10-CM | POA: Diagnosis not present

## 2023-05-03 DIAGNOSIS — Z79899 Other long term (current) drug therapy: Secondary | ICD-10-CM | POA: Diagnosis not present

## 2023-05-03 DIAGNOSIS — T8454XA Infection and inflammatory reaction due to internal left knee prosthesis, initial encounter: Secondary | ICD-10-CM | POA: Diagnosis not present

## 2023-05-03 DIAGNOSIS — A327 Listerial sepsis: Secondary | ICD-10-CM | POA: Diagnosis not present

## 2023-05-03 DIAGNOSIS — Z792 Long term (current) use of antibiotics: Secondary | ICD-10-CM | POA: Diagnosis not present

## 2023-05-03 DIAGNOSIS — N400 Enlarged prostate without lower urinary tract symptoms: Secondary | ICD-10-CM | POA: Diagnosis not present

## 2023-05-03 DIAGNOSIS — G8929 Other chronic pain: Secondary | ICD-10-CM | POA: Diagnosis not present

## 2023-05-03 DIAGNOSIS — I700 Atherosclerosis of aorta: Secondary | ICD-10-CM | POA: Diagnosis not present

## 2023-05-03 DIAGNOSIS — E559 Vitamin D deficiency, unspecified: Secondary | ICD-10-CM | POA: Diagnosis not present

## 2023-05-03 DIAGNOSIS — E1169 Type 2 diabetes mellitus with other specified complication: Secondary | ICD-10-CM | POA: Diagnosis not present

## 2023-05-03 DIAGNOSIS — Z452 Encounter for adjustment and management of vascular access device: Secondary | ICD-10-CM | POA: Diagnosis not present

## 2023-05-03 DIAGNOSIS — I2510 Atherosclerotic heart disease of native coronary artery without angina pectoris: Secondary | ICD-10-CM | POA: Diagnosis not present

## 2023-05-03 DIAGNOSIS — B952 Enterococcus as the cause of diseases classified elsewhere: Secondary | ICD-10-CM | POA: Diagnosis not present

## 2023-05-03 DIAGNOSIS — E7800 Pure hypercholesterolemia, unspecified: Secondary | ICD-10-CM | POA: Diagnosis not present

## 2023-05-03 DIAGNOSIS — I10 Essential (primary) hypertension: Secondary | ICD-10-CM | POA: Diagnosis not present

## 2023-05-03 DIAGNOSIS — A419 Sepsis, unspecified organism: Secondary | ICD-10-CM | POA: Diagnosis not present

## 2023-05-03 DIAGNOSIS — M25462 Effusion, left knee: Secondary | ICD-10-CM | POA: Diagnosis not present

## 2023-05-03 DIAGNOSIS — R7881 Bacteremia: Secondary | ICD-10-CM | POA: Diagnosis not present

## 2023-05-03 DIAGNOSIS — M5416 Radiculopathy, lumbar region: Secondary | ICD-10-CM | POA: Diagnosis not present

## 2023-05-03 LAB — URINE CULTURE: Culture: NO GROWTH

## 2023-05-04 DIAGNOSIS — I10 Essential (primary) hypertension: Secondary | ICD-10-CM | POA: Diagnosis not present

## 2023-05-04 DIAGNOSIS — I2510 Atherosclerotic heart disease of native coronary artery without angina pectoris: Secondary | ICD-10-CM | POA: Diagnosis not present

## 2023-05-04 DIAGNOSIS — M5416 Radiculopathy, lumbar region: Secondary | ICD-10-CM | POA: Diagnosis not present

## 2023-05-04 DIAGNOSIS — I700 Atherosclerosis of aorta: Secondary | ICD-10-CM | POA: Diagnosis not present

## 2023-05-04 DIAGNOSIS — K219 Gastro-esophageal reflux disease without esophagitis: Secondary | ICD-10-CM | POA: Diagnosis not present

## 2023-05-04 DIAGNOSIS — Z452 Encounter for adjustment and management of vascular access device: Secondary | ICD-10-CM | POA: Diagnosis not present

## 2023-05-04 DIAGNOSIS — T8454XA Infection and inflammatory reaction due to internal left knee prosthesis, initial encounter: Secondary | ICD-10-CM | POA: Diagnosis not present

## 2023-05-04 DIAGNOSIS — Z7982 Long term (current) use of aspirin: Secondary | ICD-10-CM | POA: Diagnosis not present

## 2023-05-04 DIAGNOSIS — Z6839 Body mass index (BMI) 39.0-39.9, adult: Secondary | ICD-10-CM | POA: Diagnosis not present

## 2023-05-04 DIAGNOSIS — M1711 Unilateral primary osteoarthritis, right knee: Secondary | ICD-10-CM | POA: Diagnosis not present

## 2023-05-04 DIAGNOSIS — G8929 Other chronic pain: Secondary | ICD-10-CM | POA: Diagnosis not present

## 2023-05-04 DIAGNOSIS — Z79899 Other long term (current) drug therapy: Secondary | ICD-10-CM | POA: Diagnosis not present

## 2023-05-04 DIAGNOSIS — G4733 Obstructive sleep apnea (adult) (pediatric): Secondary | ICD-10-CM | POA: Diagnosis not present

## 2023-05-04 DIAGNOSIS — E559 Vitamin D deficiency, unspecified: Secondary | ICD-10-CM | POA: Diagnosis not present

## 2023-05-04 DIAGNOSIS — Z792 Long term (current) use of antibiotics: Secondary | ICD-10-CM | POA: Diagnosis not present

## 2023-05-04 DIAGNOSIS — E1142 Type 2 diabetes mellitus with diabetic polyneuropathy: Secondary | ICD-10-CM | POA: Diagnosis not present

## 2023-05-04 DIAGNOSIS — M720 Palmar fascial fibromatosis [Dupuytren]: Secondary | ICD-10-CM | POA: Diagnosis not present

## 2023-05-04 DIAGNOSIS — N400 Enlarged prostate without lower urinary tract symptoms: Secondary | ICD-10-CM | POA: Diagnosis not present

## 2023-05-04 DIAGNOSIS — E7800 Pure hypercholesterolemia, unspecified: Secondary | ICD-10-CM | POA: Diagnosis not present

## 2023-05-04 DIAGNOSIS — E1169 Type 2 diabetes mellitus with other specified complication: Secondary | ICD-10-CM | POA: Diagnosis not present

## 2023-05-04 DIAGNOSIS — B952 Enterococcus as the cause of diseases classified elsewhere: Secondary | ICD-10-CM | POA: Diagnosis not present

## 2023-05-04 DIAGNOSIS — Z9181 History of falling: Secondary | ICD-10-CM | POA: Diagnosis not present

## 2023-05-05 DIAGNOSIS — N390 Urinary tract infection, site not specified: Secondary | ICD-10-CM | POA: Diagnosis not present

## 2023-05-05 DIAGNOSIS — M25462 Effusion, left knee: Secondary | ICD-10-CM | POA: Diagnosis not present

## 2023-05-05 DIAGNOSIS — M1712 Unilateral primary osteoarthritis, left knee: Secondary | ICD-10-CM | POA: Diagnosis not present

## 2023-05-05 DIAGNOSIS — A419 Sepsis, unspecified organism: Secondary | ICD-10-CM | POA: Diagnosis not present

## 2023-05-05 DIAGNOSIS — R7881 Bacteremia: Secondary | ICD-10-CM | POA: Diagnosis not present

## 2023-05-05 DIAGNOSIS — A327 Listerial sepsis: Secondary | ICD-10-CM | POA: Diagnosis not present

## 2023-05-07 DIAGNOSIS — Z79899 Other long term (current) drug therapy: Secondary | ICD-10-CM | POA: Diagnosis not present

## 2023-05-07 DIAGNOSIS — E1169 Type 2 diabetes mellitus with other specified complication: Secondary | ICD-10-CM | POA: Diagnosis not present

## 2023-05-07 DIAGNOSIS — Z452 Encounter for adjustment and management of vascular access device: Secondary | ICD-10-CM | POA: Diagnosis not present

## 2023-05-07 DIAGNOSIS — G8929 Other chronic pain: Secondary | ICD-10-CM | POA: Diagnosis not present

## 2023-05-07 DIAGNOSIS — I10 Essential (primary) hypertension: Secondary | ICD-10-CM | POA: Diagnosis not present

## 2023-05-07 DIAGNOSIS — I2510 Atherosclerotic heart disease of native coronary artery without angina pectoris: Secondary | ICD-10-CM | POA: Diagnosis not present

## 2023-05-07 DIAGNOSIS — Z5181 Encounter for therapeutic drug level monitoring: Secondary | ICD-10-CM | POA: Diagnosis not present

## 2023-05-07 DIAGNOSIS — M1711 Unilateral primary osteoarthritis, right knee: Secondary | ICD-10-CM | POA: Diagnosis not present

## 2023-05-07 DIAGNOSIS — E7800 Pure hypercholesterolemia, unspecified: Secondary | ICD-10-CM | POA: Diagnosis not present

## 2023-05-07 DIAGNOSIS — N400 Enlarged prostate without lower urinary tract symptoms: Secondary | ICD-10-CM | POA: Diagnosis not present

## 2023-05-07 DIAGNOSIS — E559 Vitamin D deficiency, unspecified: Secondary | ICD-10-CM | POA: Diagnosis not present

## 2023-05-07 DIAGNOSIS — Z792 Long term (current) use of antibiotics: Secondary | ICD-10-CM | POA: Diagnosis not present

## 2023-05-07 DIAGNOSIS — M720 Palmar fascial fibromatosis [Dupuytren]: Secondary | ICD-10-CM | POA: Diagnosis not present

## 2023-05-07 DIAGNOSIS — G4733 Obstructive sleep apnea (adult) (pediatric): Secondary | ICD-10-CM | POA: Diagnosis not present

## 2023-05-07 DIAGNOSIS — Z6839 Body mass index (BMI) 39.0-39.9, adult: Secondary | ICD-10-CM | POA: Diagnosis not present

## 2023-05-07 DIAGNOSIS — B952 Enterococcus as the cause of diseases classified elsewhere: Secondary | ICD-10-CM | POA: Diagnosis not present

## 2023-05-07 DIAGNOSIS — T8454XA Infection and inflammatory reaction due to internal left knee prosthesis, initial encounter: Secondary | ICD-10-CM | POA: Diagnosis not present

## 2023-05-07 DIAGNOSIS — K219 Gastro-esophageal reflux disease without esophagitis: Secondary | ICD-10-CM | POA: Diagnosis not present

## 2023-05-07 DIAGNOSIS — Z7982 Long term (current) use of aspirin: Secondary | ICD-10-CM | POA: Diagnosis not present

## 2023-05-07 DIAGNOSIS — M5416 Radiculopathy, lumbar region: Secondary | ICD-10-CM | POA: Diagnosis not present

## 2023-05-07 DIAGNOSIS — Z9181 History of falling: Secondary | ICD-10-CM | POA: Diagnosis not present

## 2023-05-07 DIAGNOSIS — I700 Atherosclerosis of aorta: Secondary | ICD-10-CM | POA: Diagnosis not present

## 2023-05-07 DIAGNOSIS — E1142 Type 2 diabetes mellitus with diabetic polyneuropathy: Secondary | ICD-10-CM | POA: Diagnosis not present

## 2023-05-07 LAB — LAB REPORT - SCANNED
Calcium: 8.6
EGFR: 104

## 2023-05-08 ENCOUNTER — Telehealth: Payer: Self-pay

## 2023-05-08 DIAGNOSIS — R7881 Bacteremia: Secondary | ICD-10-CM | POA: Diagnosis not present

## 2023-05-08 DIAGNOSIS — M25462 Effusion, left knee: Secondary | ICD-10-CM | POA: Diagnosis not present

## 2023-05-08 DIAGNOSIS — A327 Listerial sepsis: Secondary | ICD-10-CM | POA: Diagnosis not present

## 2023-05-08 DIAGNOSIS — M1712 Unilateral primary osteoarthritis, left knee: Secondary | ICD-10-CM | POA: Diagnosis not present

## 2023-05-08 DIAGNOSIS — N390 Urinary tract infection, site not specified: Secondary | ICD-10-CM | POA: Diagnosis not present

## 2023-05-08 DIAGNOSIS — A419 Sepsis, unspecified organism: Secondary | ICD-10-CM | POA: Diagnosis not present

## 2023-05-08 NOTE — Telephone Encounter (Signed)
-----   Message from Lynn Ito sent at 05/07/2023  6:11 PM EST ----- Please let him know that urine culture is negative ----- Message ----- From: Interface, Lab In [place] Sent: 05/03/2023   1:22 PM EST To: Lynn Ito, MD

## 2023-05-08 NOTE — Telephone Encounter (Signed)
Patient informed of negative urine culture and verbalized understanding.  Martin Hanna, CMA

## 2023-05-09 DIAGNOSIS — R3915 Urgency of urination: Secondary | ICD-10-CM | POA: Diagnosis not present

## 2023-05-10 DIAGNOSIS — M1711 Unilateral primary osteoarthritis, right knee: Secondary | ICD-10-CM | POA: Diagnosis not present

## 2023-05-10 DIAGNOSIS — M1712 Unilateral primary osteoarthritis, left knee: Secondary | ICD-10-CM | POA: Diagnosis not present

## 2023-05-10 DIAGNOSIS — M25462 Effusion, left knee: Secondary | ICD-10-CM | POA: Diagnosis not present

## 2023-05-10 DIAGNOSIS — E559 Vitamin D deficiency, unspecified: Secondary | ICD-10-CM | POA: Diagnosis not present

## 2023-05-10 DIAGNOSIS — B952 Enterococcus as the cause of diseases classified elsewhere: Secondary | ICD-10-CM | POA: Diagnosis not present

## 2023-05-10 DIAGNOSIS — G8929 Other chronic pain: Secondary | ICD-10-CM | POA: Diagnosis not present

## 2023-05-10 DIAGNOSIS — T8454XA Infection and inflammatory reaction due to internal left knee prosthesis, initial encounter: Secondary | ICD-10-CM | POA: Diagnosis not present

## 2023-05-10 DIAGNOSIS — Z6839 Body mass index (BMI) 39.0-39.9, adult: Secondary | ICD-10-CM | POA: Diagnosis not present

## 2023-05-10 DIAGNOSIS — E7800 Pure hypercholesterolemia, unspecified: Secondary | ICD-10-CM | POA: Diagnosis not present

## 2023-05-10 DIAGNOSIS — A419 Sepsis, unspecified organism: Secondary | ICD-10-CM | POA: Diagnosis not present

## 2023-05-10 DIAGNOSIS — I10 Essential (primary) hypertension: Secondary | ICD-10-CM | POA: Diagnosis not present

## 2023-05-10 DIAGNOSIS — E1169 Type 2 diabetes mellitus with other specified complication: Secondary | ICD-10-CM | POA: Diagnosis not present

## 2023-05-10 DIAGNOSIS — I2510 Atherosclerotic heart disease of native coronary artery without angina pectoris: Secondary | ICD-10-CM | POA: Diagnosis not present

## 2023-05-10 DIAGNOSIS — G4733 Obstructive sleep apnea (adult) (pediatric): Secondary | ICD-10-CM | POA: Diagnosis not present

## 2023-05-10 DIAGNOSIS — E1142 Type 2 diabetes mellitus with diabetic polyneuropathy: Secondary | ICD-10-CM | POA: Diagnosis not present

## 2023-05-10 DIAGNOSIS — K219 Gastro-esophageal reflux disease without esophagitis: Secondary | ICD-10-CM | POA: Diagnosis not present

## 2023-05-10 DIAGNOSIS — R7881 Bacteremia: Secondary | ICD-10-CM | POA: Diagnosis not present

## 2023-05-10 DIAGNOSIS — Z7982 Long term (current) use of aspirin: Secondary | ICD-10-CM | POA: Diagnosis not present

## 2023-05-10 DIAGNOSIS — A327 Listerial sepsis: Secondary | ICD-10-CM | POA: Diagnosis not present

## 2023-05-10 DIAGNOSIS — Z452 Encounter for adjustment and management of vascular access device: Secondary | ICD-10-CM | POA: Diagnosis not present

## 2023-05-10 DIAGNOSIS — N390 Urinary tract infection, site not specified: Secondary | ICD-10-CM | POA: Diagnosis not present

## 2023-05-10 DIAGNOSIS — M5416 Radiculopathy, lumbar region: Secondary | ICD-10-CM | POA: Diagnosis not present

## 2023-05-10 DIAGNOSIS — N400 Enlarged prostate without lower urinary tract symptoms: Secondary | ICD-10-CM | POA: Diagnosis not present

## 2023-05-10 DIAGNOSIS — Z79899 Other long term (current) drug therapy: Secondary | ICD-10-CM | POA: Diagnosis not present

## 2023-05-10 DIAGNOSIS — Z9181 History of falling: Secondary | ICD-10-CM | POA: Diagnosis not present

## 2023-05-10 DIAGNOSIS — M720 Palmar fascial fibromatosis [Dupuytren]: Secondary | ICD-10-CM | POA: Diagnosis not present

## 2023-05-10 DIAGNOSIS — Z792 Long term (current) use of antibiotics: Secondary | ICD-10-CM | POA: Diagnosis not present

## 2023-05-10 DIAGNOSIS — I700 Atherosclerosis of aorta: Secondary | ICD-10-CM | POA: Diagnosis not present

## 2023-05-12 DIAGNOSIS — A327 Listerial sepsis: Secondary | ICD-10-CM | POA: Diagnosis not present

## 2023-05-12 DIAGNOSIS — M25462 Effusion, left knee: Secondary | ICD-10-CM | POA: Diagnosis not present

## 2023-05-12 DIAGNOSIS — N390 Urinary tract infection, site not specified: Secondary | ICD-10-CM | POA: Diagnosis not present

## 2023-05-12 DIAGNOSIS — A419 Sepsis, unspecified organism: Secondary | ICD-10-CM | POA: Diagnosis not present

## 2023-05-12 DIAGNOSIS — M1712 Unilateral primary osteoarthritis, left knee: Secondary | ICD-10-CM | POA: Diagnosis not present

## 2023-05-12 DIAGNOSIS — R7881 Bacteremia: Secondary | ICD-10-CM | POA: Diagnosis not present

## 2023-05-14 DIAGNOSIS — M720 Palmar fascial fibromatosis [Dupuytren]: Secondary | ICD-10-CM | POA: Diagnosis not present

## 2023-05-14 DIAGNOSIS — N400 Enlarged prostate without lower urinary tract symptoms: Secondary | ICD-10-CM | POA: Diagnosis not present

## 2023-05-14 DIAGNOSIS — I2510 Atherosclerotic heart disease of native coronary artery without angina pectoris: Secondary | ICD-10-CM | POA: Diagnosis not present

## 2023-05-14 DIAGNOSIS — Z452 Encounter for adjustment and management of vascular access device: Secondary | ICD-10-CM | POA: Diagnosis not present

## 2023-05-14 DIAGNOSIS — Z79899 Other long term (current) drug therapy: Secondary | ICD-10-CM | POA: Diagnosis not present

## 2023-05-14 DIAGNOSIS — E1142 Type 2 diabetes mellitus with diabetic polyneuropathy: Secondary | ICD-10-CM | POA: Diagnosis not present

## 2023-05-14 DIAGNOSIS — G4733 Obstructive sleep apnea (adult) (pediatric): Secondary | ICD-10-CM | POA: Diagnosis not present

## 2023-05-14 DIAGNOSIS — T8454XA Infection and inflammatory reaction due to internal left knee prosthesis, initial encounter: Secondary | ICD-10-CM | POA: Diagnosis not present

## 2023-05-14 DIAGNOSIS — Z6839 Body mass index (BMI) 39.0-39.9, adult: Secondary | ICD-10-CM | POA: Diagnosis not present

## 2023-05-14 DIAGNOSIS — G8929 Other chronic pain: Secondary | ICD-10-CM | POA: Diagnosis not present

## 2023-05-14 DIAGNOSIS — E7800 Pure hypercholesterolemia, unspecified: Secondary | ICD-10-CM | POA: Diagnosis not present

## 2023-05-14 DIAGNOSIS — E559 Vitamin D deficiency, unspecified: Secondary | ICD-10-CM | POA: Diagnosis not present

## 2023-05-14 DIAGNOSIS — M1711 Unilateral primary osteoarthritis, right knee: Secondary | ICD-10-CM | POA: Diagnosis not present

## 2023-05-14 DIAGNOSIS — M5416 Radiculopathy, lumbar region: Secondary | ICD-10-CM | POA: Diagnosis not present

## 2023-05-14 DIAGNOSIS — I700 Atherosclerosis of aorta: Secondary | ICD-10-CM | POA: Diagnosis not present

## 2023-05-14 DIAGNOSIS — E1169 Type 2 diabetes mellitus with other specified complication: Secondary | ICD-10-CM | POA: Diagnosis not present

## 2023-05-14 DIAGNOSIS — Z7982 Long term (current) use of aspirin: Secondary | ICD-10-CM | POA: Diagnosis not present

## 2023-05-14 DIAGNOSIS — I10 Essential (primary) hypertension: Secondary | ICD-10-CM | POA: Diagnosis not present

## 2023-05-14 DIAGNOSIS — K219 Gastro-esophageal reflux disease without esophagitis: Secondary | ICD-10-CM | POA: Diagnosis not present

## 2023-05-14 DIAGNOSIS — Z5181 Encounter for therapeutic drug level monitoring: Secondary | ICD-10-CM | POA: Diagnosis not present

## 2023-05-14 DIAGNOSIS — Z9181 History of falling: Secondary | ICD-10-CM | POA: Diagnosis not present

## 2023-05-14 DIAGNOSIS — B952 Enterococcus as the cause of diseases classified elsewhere: Secondary | ICD-10-CM | POA: Diagnosis not present

## 2023-05-14 DIAGNOSIS — Z792 Long term (current) use of antibiotics: Secondary | ICD-10-CM | POA: Diagnosis not present

## 2023-05-15 DIAGNOSIS — M1712 Unilateral primary osteoarthritis, left knee: Secondary | ICD-10-CM | POA: Diagnosis not present

## 2023-05-15 DIAGNOSIS — A327 Listerial sepsis: Secondary | ICD-10-CM | POA: Diagnosis not present

## 2023-05-15 DIAGNOSIS — M25462 Effusion, left knee: Secondary | ICD-10-CM | POA: Diagnosis not present

## 2023-05-15 DIAGNOSIS — N390 Urinary tract infection, site not specified: Secondary | ICD-10-CM | POA: Diagnosis not present

## 2023-05-15 DIAGNOSIS — R7881 Bacteremia: Secondary | ICD-10-CM | POA: Diagnosis not present

## 2023-05-15 DIAGNOSIS — A419 Sepsis, unspecified organism: Secondary | ICD-10-CM | POA: Diagnosis not present

## 2023-05-16 DIAGNOSIS — K219 Gastro-esophageal reflux disease without esophagitis: Secondary | ICD-10-CM | POA: Diagnosis not present

## 2023-05-16 DIAGNOSIS — Z9181 History of falling: Secondary | ICD-10-CM | POA: Diagnosis not present

## 2023-05-16 DIAGNOSIS — I2510 Atherosclerotic heart disease of native coronary artery without angina pectoris: Secondary | ICD-10-CM | POA: Diagnosis not present

## 2023-05-16 DIAGNOSIS — I10 Essential (primary) hypertension: Secondary | ICD-10-CM | POA: Diagnosis not present

## 2023-05-16 DIAGNOSIS — I700 Atherosclerosis of aorta: Secondary | ICD-10-CM | POA: Diagnosis not present

## 2023-05-16 DIAGNOSIS — E7800 Pure hypercholesterolemia, unspecified: Secondary | ICD-10-CM | POA: Diagnosis not present

## 2023-05-16 DIAGNOSIS — E559 Vitamin D deficiency, unspecified: Secondary | ICD-10-CM | POA: Diagnosis not present

## 2023-05-16 DIAGNOSIS — Z7982 Long term (current) use of aspirin: Secondary | ICD-10-CM | POA: Diagnosis not present

## 2023-05-16 DIAGNOSIS — Z6839 Body mass index (BMI) 39.0-39.9, adult: Secondary | ICD-10-CM | POA: Diagnosis not present

## 2023-05-16 DIAGNOSIS — Z79899 Other long term (current) drug therapy: Secondary | ICD-10-CM | POA: Diagnosis not present

## 2023-05-16 DIAGNOSIS — B952 Enterococcus as the cause of diseases classified elsewhere: Secondary | ICD-10-CM | POA: Diagnosis not present

## 2023-05-16 DIAGNOSIS — E1169 Type 2 diabetes mellitus with other specified complication: Secondary | ICD-10-CM | POA: Diagnosis not present

## 2023-05-16 DIAGNOSIS — Z792 Long term (current) use of antibiotics: Secondary | ICD-10-CM | POA: Diagnosis not present

## 2023-05-16 DIAGNOSIS — M720 Palmar fascial fibromatosis [Dupuytren]: Secondary | ICD-10-CM | POA: Diagnosis not present

## 2023-05-16 DIAGNOSIS — M1711 Unilateral primary osteoarthritis, right knee: Secondary | ICD-10-CM | POA: Diagnosis not present

## 2023-05-16 DIAGNOSIS — E1142 Type 2 diabetes mellitus with diabetic polyneuropathy: Secondary | ICD-10-CM | POA: Diagnosis not present

## 2023-05-16 DIAGNOSIS — M5416 Radiculopathy, lumbar region: Secondary | ICD-10-CM | POA: Diagnosis not present

## 2023-05-16 DIAGNOSIS — G8929 Other chronic pain: Secondary | ICD-10-CM | POA: Diagnosis not present

## 2023-05-16 DIAGNOSIS — T8454XA Infection and inflammatory reaction due to internal left knee prosthesis, initial encounter: Secondary | ICD-10-CM | POA: Diagnosis not present

## 2023-05-16 DIAGNOSIS — G4733 Obstructive sleep apnea (adult) (pediatric): Secondary | ICD-10-CM | POA: Diagnosis not present

## 2023-05-16 DIAGNOSIS — Z452 Encounter for adjustment and management of vascular access device: Secondary | ICD-10-CM | POA: Diagnosis not present

## 2023-05-16 DIAGNOSIS — N400 Enlarged prostate without lower urinary tract symptoms: Secondary | ICD-10-CM | POA: Diagnosis not present

## 2023-05-17 DIAGNOSIS — N390 Urinary tract infection, site not specified: Secondary | ICD-10-CM | POA: Diagnosis not present

## 2023-05-17 DIAGNOSIS — Z9181 History of falling: Secondary | ICD-10-CM | POA: Diagnosis not present

## 2023-05-17 DIAGNOSIS — A419 Sepsis, unspecified organism: Secondary | ICD-10-CM | POA: Diagnosis not present

## 2023-05-17 DIAGNOSIS — Z452 Encounter for adjustment and management of vascular access device: Secondary | ICD-10-CM | POA: Diagnosis not present

## 2023-05-17 DIAGNOSIS — Z7982 Long term (current) use of aspirin: Secondary | ICD-10-CM | POA: Diagnosis not present

## 2023-05-17 DIAGNOSIS — E559 Vitamin D deficiency, unspecified: Secondary | ICD-10-CM | POA: Diagnosis not present

## 2023-05-17 DIAGNOSIS — G8929 Other chronic pain: Secondary | ICD-10-CM | POA: Diagnosis not present

## 2023-05-17 DIAGNOSIS — M25462 Effusion, left knee: Secondary | ICD-10-CM | POA: Diagnosis not present

## 2023-05-17 DIAGNOSIS — Z792 Long term (current) use of antibiotics: Secondary | ICD-10-CM | POA: Diagnosis not present

## 2023-05-17 DIAGNOSIS — E1142 Type 2 diabetes mellitus with diabetic polyneuropathy: Secondary | ICD-10-CM | POA: Diagnosis not present

## 2023-05-17 DIAGNOSIS — Z79899 Other long term (current) drug therapy: Secondary | ICD-10-CM | POA: Diagnosis not present

## 2023-05-17 DIAGNOSIS — Z6839 Body mass index (BMI) 39.0-39.9, adult: Secondary | ICD-10-CM | POA: Diagnosis not present

## 2023-05-17 DIAGNOSIS — N400 Enlarged prostate without lower urinary tract symptoms: Secondary | ICD-10-CM | POA: Diagnosis not present

## 2023-05-17 DIAGNOSIS — E1169 Type 2 diabetes mellitus with other specified complication: Secondary | ICD-10-CM | POA: Diagnosis not present

## 2023-05-17 DIAGNOSIS — A327 Listerial sepsis: Secondary | ICD-10-CM | POA: Diagnosis not present

## 2023-05-17 DIAGNOSIS — M720 Palmar fascial fibromatosis [Dupuytren]: Secondary | ICD-10-CM | POA: Diagnosis not present

## 2023-05-17 DIAGNOSIS — M1712 Unilateral primary osteoarthritis, left knee: Secondary | ICD-10-CM | POA: Diagnosis not present

## 2023-05-17 DIAGNOSIS — M1711 Unilateral primary osteoarthritis, right knee: Secondary | ICD-10-CM | POA: Diagnosis not present

## 2023-05-17 DIAGNOSIS — B952 Enterococcus as the cause of diseases classified elsewhere: Secondary | ICD-10-CM | POA: Diagnosis not present

## 2023-05-17 DIAGNOSIS — E7800 Pure hypercholesterolemia, unspecified: Secondary | ICD-10-CM | POA: Diagnosis not present

## 2023-05-17 DIAGNOSIS — R7881 Bacteremia: Secondary | ICD-10-CM | POA: Diagnosis not present

## 2023-05-17 DIAGNOSIS — G4733 Obstructive sleep apnea (adult) (pediatric): Secondary | ICD-10-CM | POA: Diagnosis not present

## 2023-05-17 DIAGNOSIS — I10 Essential (primary) hypertension: Secondary | ICD-10-CM | POA: Diagnosis not present

## 2023-05-17 DIAGNOSIS — M5416 Radiculopathy, lumbar region: Secondary | ICD-10-CM | POA: Diagnosis not present

## 2023-05-17 DIAGNOSIS — I2510 Atherosclerotic heart disease of native coronary artery without angina pectoris: Secondary | ICD-10-CM | POA: Diagnosis not present

## 2023-05-17 DIAGNOSIS — T8454XA Infection and inflammatory reaction due to internal left knee prosthesis, initial encounter: Secondary | ICD-10-CM | POA: Diagnosis not present

## 2023-05-17 DIAGNOSIS — K219 Gastro-esophageal reflux disease without esophagitis: Secondary | ICD-10-CM | POA: Diagnosis not present

## 2023-05-17 DIAGNOSIS — I700 Atherosclerosis of aorta: Secondary | ICD-10-CM | POA: Diagnosis not present

## 2023-05-18 ENCOUNTER — Telehealth: Payer: Self-pay

## 2023-05-18 ENCOUNTER — Telehealth: Payer: Self-pay | Admitting: Infectious Diseases

## 2023-05-18 NOTE — Telephone Encounter (Signed)
 Patient's wife called office stating she needs to schedule appt with Dr. Rivka Safer first week of March due to patient ending antibiotics soon. First dose was 04/26/23 and was told he would be on this for 6 weeks. End date 3/10.  Spoke with Dr. Rivka Safer who has end date as 3/20. Will need update [facility] with updated orders to send additional antibiotics.  Spoke with Amy, Phamracist who was able to take verbal order. Will have labs faxed to [phone]. Patients  wife updated. Juanita Laster, RMA

## 2023-05-18 NOTE — Telephone Encounter (Signed)
 Called patient to see how he is doing He is doing IV ampicillin 24 hr infusion for the left knee PJI with enterococcus fecalis. Knee pain is better but stiff, getting PT, had  surgery and removal of the entire hardware and placement of antibiotic spacer on 04/23/2023  ESR 16, WBC 8.9, CRP 6.6, cr 0.80 Pts main complaint is frequency of urine ( 05/02/23 UC N) HE saw his urologist Dr.Wrenn and he gave him Gemtesa 6 week supply and it is helping him HE is on 6 weeks of ampicillin   Diagnosis: Left knee PJI with enterococcus fecalis S/p explantation and spacer placement Baseline Creatinine <1    Allergies  Allergen Reactions   Elemental Sulfur Anaphylaxis and Swelling    Throat swelling    Sulfamethoxazole Anaphylaxis and Swelling    [facility] Orders Ampicillin 12 grams given as a continuous infusion every 24 hours for 6 + weeks until 06/14/23  End Date: 06/14/23  [facility] Care Per Protocol:  Labs weekly while on IV antibiotics: _X_ CBC with differential  _X_ CMP _X_ CRP _X_ ESR   _X_ Please pull PIC at completion of IV antibiotics  Fax weekly lab results  promptly to [phone]  Clinic Follow Up Appt: 06/14/23 with Dr.Alaya Iverson   Call [phone] with any questions

## 2023-05-19 DIAGNOSIS — N390 Urinary tract infection, site not specified: Secondary | ICD-10-CM | POA: Diagnosis not present

## 2023-05-19 DIAGNOSIS — A419 Sepsis, unspecified organism: Secondary | ICD-10-CM | POA: Diagnosis not present

## 2023-05-19 DIAGNOSIS — R7881 Bacteremia: Secondary | ICD-10-CM | POA: Diagnosis not present

## 2023-05-19 DIAGNOSIS — M25462 Effusion, left knee: Secondary | ICD-10-CM | POA: Diagnosis not present

## 2023-05-19 DIAGNOSIS — M1712 Unilateral primary osteoarthritis, left knee: Secondary | ICD-10-CM | POA: Diagnosis not present

## 2023-05-19 DIAGNOSIS — A327 Listerial sepsis: Secondary | ICD-10-CM | POA: Diagnosis not present

## 2023-05-21 DIAGNOSIS — N400 Enlarged prostate without lower urinary tract symptoms: Secondary | ICD-10-CM | POA: Diagnosis not present

## 2023-05-21 DIAGNOSIS — E1142 Type 2 diabetes mellitus with diabetic polyneuropathy: Secondary | ICD-10-CM | POA: Diagnosis not present

## 2023-05-21 DIAGNOSIS — M720 Palmar fascial fibromatosis [Dupuytren]: Secondary | ICD-10-CM | POA: Diagnosis not present

## 2023-05-21 DIAGNOSIS — M5416 Radiculopathy, lumbar region: Secondary | ICD-10-CM | POA: Diagnosis not present

## 2023-05-21 DIAGNOSIS — G4733 Obstructive sleep apnea (adult) (pediatric): Secondary | ICD-10-CM | POA: Diagnosis not present

## 2023-05-21 DIAGNOSIS — I2510 Atherosclerotic heart disease of native coronary artery without angina pectoris: Secondary | ICD-10-CM | POA: Diagnosis not present

## 2023-05-21 DIAGNOSIS — Z7982 Long term (current) use of aspirin: Secondary | ICD-10-CM | POA: Diagnosis not present

## 2023-05-21 DIAGNOSIS — E1169 Type 2 diabetes mellitus with other specified complication: Secondary | ICD-10-CM | POA: Diagnosis not present

## 2023-05-21 DIAGNOSIS — I10 Essential (primary) hypertension: Secondary | ICD-10-CM | POA: Diagnosis not present

## 2023-05-21 DIAGNOSIS — Z6839 Body mass index (BMI) 39.0-39.9, adult: Secondary | ICD-10-CM | POA: Diagnosis not present

## 2023-05-21 DIAGNOSIS — Z452 Encounter for adjustment and management of vascular access device: Secondary | ICD-10-CM | POA: Diagnosis not present

## 2023-05-21 DIAGNOSIS — M1711 Unilateral primary osteoarthritis, right knee: Secondary | ICD-10-CM | POA: Diagnosis not present

## 2023-05-21 DIAGNOSIS — E559 Vitamin D deficiency, unspecified: Secondary | ICD-10-CM | POA: Diagnosis not present

## 2023-05-21 DIAGNOSIS — Z5181 Encounter for therapeutic drug level monitoring: Secondary | ICD-10-CM | POA: Diagnosis not present

## 2023-05-21 DIAGNOSIS — Z9181 History of falling: Secondary | ICD-10-CM | POA: Diagnosis not present

## 2023-05-21 DIAGNOSIS — T8454XA Infection and inflammatory reaction due to internal left knee prosthesis, initial encounter: Secondary | ICD-10-CM | POA: Diagnosis not present

## 2023-05-21 DIAGNOSIS — B952 Enterococcus as the cause of diseases classified elsewhere: Secondary | ICD-10-CM | POA: Diagnosis not present

## 2023-05-21 DIAGNOSIS — E7800 Pure hypercholesterolemia, unspecified: Secondary | ICD-10-CM | POA: Diagnosis not present

## 2023-05-21 DIAGNOSIS — K219 Gastro-esophageal reflux disease without esophagitis: Secondary | ICD-10-CM | POA: Diagnosis not present

## 2023-05-21 DIAGNOSIS — Z79899 Other long term (current) drug therapy: Secondary | ICD-10-CM | POA: Diagnosis not present

## 2023-05-21 DIAGNOSIS — Z792 Long term (current) use of antibiotics: Secondary | ICD-10-CM | POA: Diagnosis not present

## 2023-05-21 DIAGNOSIS — G8929 Other chronic pain: Secondary | ICD-10-CM | POA: Diagnosis not present

## 2023-05-21 DIAGNOSIS — I700 Atherosclerosis of aorta: Secondary | ICD-10-CM | POA: Diagnosis not present

## 2023-05-21 LAB — LAB REPORT - SCANNED
Calcium: 8.9
EGFR: 99

## 2023-05-22 DIAGNOSIS — R7881 Bacteremia: Secondary | ICD-10-CM | POA: Diagnosis not present

## 2023-05-22 DIAGNOSIS — M25462 Effusion, left knee: Secondary | ICD-10-CM | POA: Diagnosis not present

## 2023-05-22 DIAGNOSIS — M1712 Unilateral primary osteoarthritis, left knee: Secondary | ICD-10-CM | POA: Diagnosis not present

## 2023-05-22 DIAGNOSIS — A419 Sepsis, unspecified organism: Secondary | ICD-10-CM | POA: Diagnosis not present

## 2023-05-22 DIAGNOSIS — N390 Urinary tract infection, site not specified: Secondary | ICD-10-CM | POA: Diagnosis not present

## 2023-05-22 DIAGNOSIS — A327 Listerial sepsis: Secondary | ICD-10-CM | POA: Diagnosis not present

## 2023-05-24 DIAGNOSIS — R7881 Bacteremia: Secondary | ICD-10-CM | POA: Diagnosis not present

## 2023-05-24 DIAGNOSIS — M720 Palmar fascial fibromatosis [Dupuytren]: Secondary | ICD-10-CM | POA: Diagnosis not present

## 2023-05-24 DIAGNOSIS — G4733 Obstructive sleep apnea (adult) (pediatric): Secondary | ICD-10-CM | POA: Diagnosis not present

## 2023-05-24 DIAGNOSIS — G8929 Other chronic pain: Secondary | ICD-10-CM | POA: Diagnosis not present

## 2023-05-24 DIAGNOSIS — Z452 Encounter for adjustment and management of vascular access device: Secondary | ICD-10-CM | POA: Diagnosis not present

## 2023-05-24 DIAGNOSIS — N390 Urinary tract infection, site not specified: Secondary | ICD-10-CM | POA: Diagnosis not present

## 2023-05-24 DIAGNOSIS — E1169 Type 2 diabetes mellitus with other specified complication: Secondary | ICD-10-CM | POA: Diagnosis not present

## 2023-05-24 DIAGNOSIS — E1142 Type 2 diabetes mellitus with diabetic polyneuropathy: Secondary | ICD-10-CM | POA: Diagnosis not present

## 2023-05-24 DIAGNOSIS — E559 Vitamin D deficiency, unspecified: Secondary | ICD-10-CM | POA: Diagnosis not present

## 2023-05-24 DIAGNOSIS — A327 Listerial sepsis: Secondary | ICD-10-CM | POA: Diagnosis not present

## 2023-05-24 DIAGNOSIS — T8454XA Infection and inflammatory reaction due to internal left knee prosthesis, initial encounter: Secondary | ICD-10-CM | POA: Diagnosis not present

## 2023-05-24 DIAGNOSIS — I700 Atherosclerosis of aorta: Secondary | ICD-10-CM | POA: Diagnosis not present

## 2023-05-24 DIAGNOSIS — I2510 Atherosclerotic heart disease of native coronary artery without angina pectoris: Secondary | ICD-10-CM | POA: Diagnosis not present

## 2023-05-24 DIAGNOSIS — N400 Enlarged prostate without lower urinary tract symptoms: Secondary | ICD-10-CM | POA: Diagnosis not present

## 2023-05-24 DIAGNOSIS — M5416 Radiculopathy, lumbar region: Secondary | ICD-10-CM | POA: Diagnosis not present

## 2023-05-24 DIAGNOSIS — M1712 Unilateral primary osteoarthritis, left knee: Secondary | ICD-10-CM | POA: Diagnosis not present

## 2023-05-24 DIAGNOSIS — M1711 Unilateral primary osteoarthritis, right knee: Secondary | ICD-10-CM | POA: Diagnosis not present

## 2023-05-24 DIAGNOSIS — Z9181 History of falling: Secondary | ICD-10-CM | POA: Diagnosis not present

## 2023-05-24 DIAGNOSIS — Z6839 Body mass index (BMI) 39.0-39.9, adult: Secondary | ICD-10-CM | POA: Diagnosis not present

## 2023-05-24 DIAGNOSIS — K219 Gastro-esophageal reflux disease without esophagitis: Secondary | ICD-10-CM | POA: Diagnosis not present

## 2023-05-24 DIAGNOSIS — Z79899 Other long term (current) drug therapy: Secondary | ICD-10-CM | POA: Diagnosis not present

## 2023-05-24 DIAGNOSIS — M25462 Effusion, left knee: Secondary | ICD-10-CM | POA: Diagnosis not present

## 2023-05-24 DIAGNOSIS — A419 Sepsis, unspecified organism: Secondary | ICD-10-CM | POA: Diagnosis not present

## 2023-05-24 DIAGNOSIS — E7800 Pure hypercholesterolemia, unspecified: Secondary | ICD-10-CM | POA: Diagnosis not present

## 2023-05-24 DIAGNOSIS — Z7982 Long term (current) use of aspirin: Secondary | ICD-10-CM | POA: Diagnosis not present

## 2023-05-24 DIAGNOSIS — B952 Enterococcus as the cause of diseases classified elsewhere: Secondary | ICD-10-CM | POA: Diagnosis not present

## 2023-05-24 DIAGNOSIS — I10 Essential (primary) hypertension: Secondary | ICD-10-CM | POA: Diagnosis not present

## 2023-05-24 DIAGNOSIS — Z792 Long term (current) use of antibiotics: Secondary | ICD-10-CM | POA: Diagnosis not present

## 2023-05-26 DIAGNOSIS — A327 Listerial sepsis: Secondary | ICD-10-CM | POA: Diagnosis not present

## 2023-05-26 DIAGNOSIS — M1712 Unilateral primary osteoarthritis, left knee: Secondary | ICD-10-CM | POA: Diagnosis not present

## 2023-05-26 DIAGNOSIS — R7881 Bacteremia: Secondary | ICD-10-CM | POA: Diagnosis not present

## 2023-05-26 DIAGNOSIS — N390 Urinary tract infection, site not specified: Secondary | ICD-10-CM | POA: Diagnosis not present

## 2023-05-26 DIAGNOSIS — M25462 Effusion, left knee: Secondary | ICD-10-CM | POA: Diagnosis not present

## 2023-05-26 DIAGNOSIS — A419 Sepsis, unspecified organism: Secondary | ICD-10-CM | POA: Diagnosis not present

## 2023-05-28 DIAGNOSIS — Z7982 Long term (current) use of aspirin: Secondary | ICD-10-CM | POA: Diagnosis not present

## 2023-05-28 DIAGNOSIS — Z6839 Body mass index (BMI) 39.0-39.9, adult: Secondary | ICD-10-CM | POA: Diagnosis not present

## 2023-05-28 DIAGNOSIS — I700 Atherosclerosis of aorta: Secondary | ICD-10-CM | POA: Diagnosis not present

## 2023-05-28 DIAGNOSIS — I2510 Atherosclerotic heart disease of native coronary artery without angina pectoris: Secondary | ICD-10-CM | POA: Diagnosis not present

## 2023-05-28 DIAGNOSIS — B952 Enterococcus as the cause of diseases classified elsewhere: Secondary | ICD-10-CM | POA: Diagnosis not present

## 2023-05-28 DIAGNOSIS — Z79899 Other long term (current) drug therapy: Secondary | ICD-10-CM | POA: Diagnosis not present

## 2023-05-28 DIAGNOSIS — M1711 Unilateral primary osteoarthritis, right knee: Secondary | ICD-10-CM | POA: Diagnosis not present

## 2023-05-28 DIAGNOSIS — T8454XA Infection and inflammatory reaction due to internal left knee prosthesis, initial encounter: Secondary | ICD-10-CM | POA: Diagnosis not present

## 2023-05-28 DIAGNOSIS — N400 Enlarged prostate without lower urinary tract symptoms: Secondary | ICD-10-CM | POA: Diagnosis not present

## 2023-05-28 DIAGNOSIS — Z9181 History of falling: Secondary | ICD-10-CM | POA: Diagnosis not present

## 2023-05-28 DIAGNOSIS — G4733 Obstructive sleep apnea (adult) (pediatric): Secondary | ICD-10-CM | POA: Diagnosis not present

## 2023-05-28 DIAGNOSIS — M720 Palmar fascial fibromatosis [Dupuytren]: Secondary | ICD-10-CM | POA: Diagnosis not present

## 2023-05-28 DIAGNOSIS — Z792 Long term (current) use of antibiotics: Secondary | ICD-10-CM | POA: Diagnosis not present

## 2023-05-28 DIAGNOSIS — E559 Vitamin D deficiency, unspecified: Secondary | ICD-10-CM | POA: Diagnosis not present

## 2023-05-28 DIAGNOSIS — E1142 Type 2 diabetes mellitus with diabetic polyneuropathy: Secondary | ICD-10-CM | POA: Diagnosis not present

## 2023-05-28 DIAGNOSIS — Z5181 Encounter for therapeutic drug level monitoring: Secondary | ICD-10-CM | POA: Diagnosis not present

## 2023-05-28 DIAGNOSIS — E1169 Type 2 diabetes mellitus with other specified complication: Secondary | ICD-10-CM | POA: Diagnosis not present

## 2023-05-28 DIAGNOSIS — E7800 Pure hypercholesterolemia, unspecified: Secondary | ICD-10-CM | POA: Diagnosis not present

## 2023-05-28 DIAGNOSIS — M5416 Radiculopathy, lumbar region: Secondary | ICD-10-CM | POA: Diagnosis not present

## 2023-05-28 DIAGNOSIS — K219 Gastro-esophageal reflux disease without esophagitis: Secondary | ICD-10-CM | POA: Diagnosis not present

## 2023-05-28 DIAGNOSIS — I10 Essential (primary) hypertension: Secondary | ICD-10-CM | POA: Diagnosis not present

## 2023-05-28 DIAGNOSIS — G8929 Other chronic pain: Secondary | ICD-10-CM | POA: Diagnosis not present

## 2023-05-28 DIAGNOSIS — Z452 Encounter for adjustment and management of vascular access device: Secondary | ICD-10-CM | POA: Diagnosis not present

## 2023-05-28 LAB — COMPREHENSIVE METABOLIC PANEL WITH GFR
Calcium: 9
EGFR: 99

## 2023-05-29 DIAGNOSIS — E785 Hyperlipidemia, unspecified: Secondary | ICD-10-CM | POA: Diagnosis not present

## 2023-05-29 DIAGNOSIS — A327 Listerial sepsis: Secondary | ICD-10-CM | POA: Diagnosis not present

## 2023-05-29 DIAGNOSIS — N390 Urinary tract infection, site not specified: Secondary | ICD-10-CM | POA: Diagnosis not present

## 2023-05-29 DIAGNOSIS — R7881 Bacteremia: Secondary | ICD-10-CM | POA: Diagnosis not present

## 2023-05-29 DIAGNOSIS — E1169 Type 2 diabetes mellitus with other specified complication: Secondary | ICD-10-CM | POA: Diagnosis not present

## 2023-05-29 DIAGNOSIS — I10 Essential (primary) hypertension: Secondary | ICD-10-CM | POA: Diagnosis not present

## 2023-05-29 DIAGNOSIS — A419 Sepsis, unspecified organism: Secondary | ICD-10-CM | POA: Diagnosis not present

## 2023-05-29 DIAGNOSIS — E7800 Pure hypercholesterolemia, unspecified: Secondary | ICD-10-CM | POA: Diagnosis not present

## 2023-05-29 DIAGNOSIS — N400 Enlarged prostate without lower urinary tract symptoms: Secondary | ICD-10-CM | POA: Diagnosis not present

## 2023-05-29 DIAGNOSIS — R0789 Other chest pain: Secondary | ICD-10-CM | POA: Diagnosis not present

## 2023-05-29 DIAGNOSIS — G8929 Other chronic pain: Secondary | ICD-10-CM | POA: Diagnosis not present

## 2023-05-29 DIAGNOSIS — M25462 Effusion, left knee: Secondary | ICD-10-CM | POA: Diagnosis not present

## 2023-05-29 DIAGNOSIS — M1712 Unilateral primary osteoarthritis, left knee: Secondary | ICD-10-CM | POA: Diagnosis not present

## 2023-05-31 DIAGNOSIS — G8929 Other chronic pain: Secondary | ICD-10-CM | POA: Diagnosis not present

## 2023-05-31 DIAGNOSIS — A327 Listerial sepsis: Secondary | ICD-10-CM | POA: Diagnosis not present

## 2023-05-31 DIAGNOSIS — N400 Enlarged prostate without lower urinary tract symptoms: Secondary | ICD-10-CM | POA: Diagnosis not present

## 2023-05-31 DIAGNOSIS — Z792 Long term (current) use of antibiotics: Secondary | ICD-10-CM | POA: Diagnosis not present

## 2023-05-31 DIAGNOSIS — G4733 Obstructive sleep apnea (adult) (pediatric): Secondary | ICD-10-CM | POA: Diagnosis not present

## 2023-05-31 DIAGNOSIS — E1169 Type 2 diabetes mellitus with other specified complication: Secondary | ICD-10-CM | POA: Diagnosis not present

## 2023-05-31 DIAGNOSIS — I10 Essential (primary) hypertension: Secondary | ICD-10-CM | POA: Diagnosis not present

## 2023-05-31 DIAGNOSIS — T8454XA Infection and inflammatory reaction due to internal left knee prosthesis, initial encounter: Secondary | ICD-10-CM | POA: Diagnosis not present

## 2023-05-31 DIAGNOSIS — Z7982 Long term (current) use of aspirin: Secondary | ICD-10-CM | POA: Diagnosis not present

## 2023-05-31 DIAGNOSIS — Z79899 Other long term (current) drug therapy: Secondary | ICD-10-CM | POA: Diagnosis not present

## 2023-05-31 DIAGNOSIS — I700 Atherosclerosis of aorta: Secondary | ICD-10-CM | POA: Diagnosis not present

## 2023-05-31 DIAGNOSIS — N390 Urinary tract infection, site not specified: Secondary | ICD-10-CM | POA: Diagnosis not present

## 2023-05-31 DIAGNOSIS — E7800 Pure hypercholesterolemia, unspecified: Secondary | ICD-10-CM | POA: Diagnosis not present

## 2023-05-31 DIAGNOSIS — M720 Palmar fascial fibromatosis [Dupuytren]: Secondary | ICD-10-CM | POA: Diagnosis not present

## 2023-05-31 DIAGNOSIS — I2510 Atherosclerotic heart disease of native coronary artery without angina pectoris: Secondary | ICD-10-CM | POA: Diagnosis not present

## 2023-05-31 DIAGNOSIS — Z6839 Body mass index (BMI) 39.0-39.9, adult: Secondary | ICD-10-CM | POA: Diagnosis not present

## 2023-05-31 DIAGNOSIS — R7881 Bacteremia: Secondary | ICD-10-CM | POA: Diagnosis not present

## 2023-05-31 DIAGNOSIS — M1712 Unilateral primary osteoarthritis, left knee: Secondary | ICD-10-CM | POA: Diagnosis not present

## 2023-05-31 DIAGNOSIS — K219 Gastro-esophageal reflux disease without esophagitis: Secondary | ICD-10-CM | POA: Diagnosis not present

## 2023-05-31 DIAGNOSIS — A419 Sepsis, unspecified organism: Secondary | ICD-10-CM | POA: Diagnosis not present

## 2023-05-31 DIAGNOSIS — M5416 Radiculopathy, lumbar region: Secondary | ICD-10-CM | POA: Diagnosis not present

## 2023-05-31 DIAGNOSIS — Z452 Encounter for adjustment and management of vascular access device: Secondary | ICD-10-CM | POA: Diagnosis not present

## 2023-05-31 DIAGNOSIS — M25462 Effusion, left knee: Secondary | ICD-10-CM | POA: Diagnosis not present

## 2023-05-31 DIAGNOSIS — E1142 Type 2 diabetes mellitus with diabetic polyneuropathy: Secondary | ICD-10-CM | POA: Diagnosis not present

## 2023-05-31 DIAGNOSIS — B952 Enterococcus as the cause of diseases classified elsewhere: Secondary | ICD-10-CM | POA: Diagnosis not present

## 2023-05-31 DIAGNOSIS — Z9181 History of falling: Secondary | ICD-10-CM | POA: Diagnosis not present

## 2023-05-31 DIAGNOSIS — E559 Vitamin D deficiency, unspecified: Secondary | ICD-10-CM | POA: Diagnosis not present

## 2023-05-31 DIAGNOSIS — M1711 Unilateral primary osteoarthritis, right knee: Secondary | ICD-10-CM | POA: Diagnosis not present

## 2023-06-02 DIAGNOSIS — R7881 Bacteremia: Secondary | ICD-10-CM | POA: Diagnosis not present

## 2023-06-02 DIAGNOSIS — A327 Listerial sepsis: Secondary | ICD-10-CM | POA: Diagnosis not present

## 2023-06-02 DIAGNOSIS — N390 Urinary tract infection, site not specified: Secondary | ICD-10-CM | POA: Diagnosis not present

## 2023-06-02 DIAGNOSIS — A419 Sepsis, unspecified organism: Secondary | ICD-10-CM | POA: Diagnosis not present

## 2023-06-02 DIAGNOSIS — M1712 Unilateral primary osteoarthritis, left knee: Secondary | ICD-10-CM | POA: Diagnosis not present

## 2023-06-02 DIAGNOSIS — M25462 Effusion, left knee: Secondary | ICD-10-CM | POA: Diagnosis not present

## 2023-06-04 DIAGNOSIS — Z79899 Other long term (current) drug therapy: Secondary | ICD-10-CM | POA: Diagnosis not present

## 2023-06-04 DIAGNOSIS — B952 Enterococcus as the cause of diseases classified elsewhere: Secondary | ICD-10-CM | POA: Diagnosis not present

## 2023-06-04 DIAGNOSIS — Z452 Encounter for adjustment and management of vascular access device: Secondary | ICD-10-CM | POA: Diagnosis not present

## 2023-06-04 DIAGNOSIS — I700 Atherosclerosis of aorta: Secondary | ICD-10-CM | POA: Diagnosis not present

## 2023-06-04 DIAGNOSIS — E1142 Type 2 diabetes mellitus with diabetic polyneuropathy: Secondary | ICD-10-CM | POA: Diagnosis not present

## 2023-06-04 DIAGNOSIS — Z7982 Long term (current) use of aspirin: Secondary | ICD-10-CM | POA: Diagnosis not present

## 2023-06-04 DIAGNOSIS — N400 Enlarged prostate without lower urinary tract symptoms: Secondary | ICD-10-CM | POA: Diagnosis not present

## 2023-06-04 DIAGNOSIS — E7800 Pure hypercholesterolemia, unspecified: Secondary | ICD-10-CM | POA: Diagnosis not present

## 2023-06-04 DIAGNOSIS — Z792 Long term (current) use of antibiotics: Secondary | ICD-10-CM | POA: Diagnosis not present

## 2023-06-04 DIAGNOSIS — Z5181 Encounter for therapeutic drug level monitoring: Secondary | ICD-10-CM | POA: Diagnosis not present

## 2023-06-04 DIAGNOSIS — G4733 Obstructive sleep apnea (adult) (pediatric): Secondary | ICD-10-CM | POA: Diagnosis not present

## 2023-06-04 DIAGNOSIS — M5416 Radiculopathy, lumbar region: Secondary | ICD-10-CM | POA: Diagnosis not present

## 2023-06-04 DIAGNOSIS — E1169 Type 2 diabetes mellitus with other specified complication: Secondary | ICD-10-CM | POA: Diagnosis not present

## 2023-06-04 DIAGNOSIS — I10 Essential (primary) hypertension: Secondary | ICD-10-CM | POA: Diagnosis not present

## 2023-06-04 DIAGNOSIS — G8929 Other chronic pain: Secondary | ICD-10-CM | POA: Diagnosis not present

## 2023-06-04 DIAGNOSIS — M1711 Unilateral primary osteoarthritis, right knee: Secondary | ICD-10-CM | POA: Diagnosis not present

## 2023-06-04 DIAGNOSIS — Z9181 History of falling: Secondary | ICD-10-CM | POA: Diagnosis not present

## 2023-06-04 DIAGNOSIS — Z6839 Body mass index (BMI) 39.0-39.9, adult: Secondary | ICD-10-CM | POA: Diagnosis not present

## 2023-06-04 DIAGNOSIS — K219 Gastro-esophageal reflux disease without esophagitis: Secondary | ICD-10-CM | POA: Diagnosis not present

## 2023-06-04 DIAGNOSIS — M720 Palmar fascial fibromatosis [Dupuytren]: Secondary | ICD-10-CM | POA: Diagnosis not present

## 2023-06-04 DIAGNOSIS — I2510 Atherosclerotic heart disease of native coronary artery without angina pectoris: Secondary | ICD-10-CM | POA: Diagnosis not present

## 2023-06-04 DIAGNOSIS — E559 Vitamin D deficiency, unspecified: Secondary | ICD-10-CM | POA: Diagnosis not present

## 2023-06-04 DIAGNOSIS — T8454XA Infection and inflammatory reaction due to internal left knee prosthesis, initial encounter: Secondary | ICD-10-CM | POA: Diagnosis not present

## 2023-06-04 LAB — LAB REPORT - SCANNED
Calcium: 8.8
EGFR: 104

## 2023-06-05 DIAGNOSIS — N390 Urinary tract infection, site not specified: Secondary | ICD-10-CM | POA: Diagnosis not present

## 2023-06-05 DIAGNOSIS — M25462 Effusion, left knee: Secondary | ICD-10-CM | POA: Diagnosis not present

## 2023-06-05 DIAGNOSIS — A419 Sepsis, unspecified organism: Secondary | ICD-10-CM | POA: Diagnosis not present

## 2023-06-05 DIAGNOSIS — R7881 Bacteremia: Secondary | ICD-10-CM | POA: Diagnosis not present

## 2023-06-05 DIAGNOSIS — M1712 Unilateral primary osteoarthritis, left knee: Secondary | ICD-10-CM | POA: Diagnosis not present

## 2023-06-05 DIAGNOSIS — A327 Listerial sepsis: Secondary | ICD-10-CM | POA: Diagnosis not present

## 2023-06-07 DIAGNOSIS — Z79899 Other long term (current) drug therapy: Secondary | ICD-10-CM | POA: Diagnosis not present

## 2023-06-07 DIAGNOSIS — E559 Vitamin D deficiency, unspecified: Secondary | ICD-10-CM | POA: Diagnosis not present

## 2023-06-07 DIAGNOSIS — E1169 Type 2 diabetes mellitus with other specified complication: Secondary | ICD-10-CM | POA: Diagnosis not present

## 2023-06-07 DIAGNOSIS — T8454XA Infection and inflammatory reaction due to internal left knee prosthesis, initial encounter: Secondary | ICD-10-CM | POA: Diagnosis not present

## 2023-06-07 DIAGNOSIS — G4733 Obstructive sleep apnea (adult) (pediatric): Secondary | ICD-10-CM | POA: Diagnosis not present

## 2023-06-07 DIAGNOSIS — M1712 Unilateral primary osteoarthritis, left knee: Secondary | ICD-10-CM | POA: Diagnosis not present

## 2023-06-07 DIAGNOSIS — G8929 Other chronic pain: Secondary | ICD-10-CM | POA: Diagnosis not present

## 2023-06-07 DIAGNOSIS — B952 Enterococcus as the cause of diseases classified elsewhere: Secondary | ICD-10-CM | POA: Diagnosis not present

## 2023-06-07 DIAGNOSIS — Z792 Long term (current) use of antibiotics: Secondary | ICD-10-CM | POA: Diagnosis not present

## 2023-06-07 DIAGNOSIS — E1142 Type 2 diabetes mellitus with diabetic polyneuropathy: Secondary | ICD-10-CM | POA: Diagnosis not present

## 2023-06-07 DIAGNOSIS — I700 Atherosclerosis of aorta: Secondary | ICD-10-CM | POA: Diagnosis not present

## 2023-06-07 DIAGNOSIS — M1711 Unilateral primary osteoarthritis, right knee: Secondary | ICD-10-CM | POA: Diagnosis not present

## 2023-06-07 DIAGNOSIS — I2510 Atherosclerotic heart disease of native coronary artery without angina pectoris: Secondary | ICD-10-CM | POA: Diagnosis not present

## 2023-06-07 DIAGNOSIS — K219 Gastro-esophageal reflux disease without esophagitis: Secondary | ICD-10-CM | POA: Diagnosis not present

## 2023-06-07 DIAGNOSIS — Z452 Encounter for adjustment and management of vascular access device: Secondary | ICD-10-CM | POA: Diagnosis not present

## 2023-06-07 DIAGNOSIS — A419 Sepsis, unspecified organism: Secondary | ICD-10-CM | POA: Diagnosis not present

## 2023-06-07 DIAGNOSIS — I10 Essential (primary) hypertension: Secondary | ICD-10-CM | POA: Diagnosis not present

## 2023-06-07 DIAGNOSIS — N390 Urinary tract infection, site not specified: Secondary | ICD-10-CM | POA: Diagnosis not present

## 2023-06-07 DIAGNOSIS — M720 Palmar fascial fibromatosis [Dupuytren]: Secondary | ICD-10-CM | POA: Diagnosis not present

## 2023-06-07 DIAGNOSIS — A327 Listerial sepsis: Secondary | ICD-10-CM | POA: Diagnosis not present

## 2023-06-07 DIAGNOSIS — M5416 Radiculopathy, lumbar region: Secondary | ICD-10-CM | POA: Diagnosis not present

## 2023-06-07 DIAGNOSIS — Z7982 Long term (current) use of aspirin: Secondary | ICD-10-CM | POA: Diagnosis not present

## 2023-06-07 DIAGNOSIS — R7881 Bacteremia: Secondary | ICD-10-CM | POA: Diagnosis not present

## 2023-06-07 DIAGNOSIS — M25462 Effusion, left knee: Secondary | ICD-10-CM | POA: Diagnosis not present

## 2023-06-07 DIAGNOSIS — N400 Enlarged prostate without lower urinary tract symptoms: Secondary | ICD-10-CM | POA: Diagnosis not present

## 2023-06-07 DIAGNOSIS — Z6839 Body mass index (BMI) 39.0-39.9, adult: Secondary | ICD-10-CM | POA: Diagnosis not present

## 2023-06-07 DIAGNOSIS — E7800 Pure hypercholesterolemia, unspecified: Secondary | ICD-10-CM | POA: Diagnosis not present

## 2023-06-07 DIAGNOSIS — Z9181 History of falling: Secondary | ICD-10-CM | POA: Diagnosis not present

## 2023-06-09 DIAGNOSIS — R7881 Bacteremia: Secondary | ICD-10-CM | POA: Diagnosis not present

## 2023-06-09 DIAGNOSIS — N390 Urinary tract infection, site not specified: Secondary | ICD-10-CM | POA: Diagnosis not present

## 2023-06-09 DIAGNOSIS — A419 Sepsis, unspecified organism: Secondary | ICD-10-CM | POA: Diagnosis not present

## 2023-06-09 DIAGNOSIS — A327 Listerial sepsis: Secondary | ICD-10-CM | POA: Diagnosis not present

## 2023-06-09 DIAGNOSIS — M25462 Effusion, left knee: Secondary | ICD-10-CM | POA: Diagnosis not present

## 2023-06-09 DIAGNOSIS — M1712 Unilateral primary osteoarthritis, left knee: Secondary | ICD-10-CM | POA: Diagnosis not present

## 2023-06-11 DIAGNOSIS — K219 Gastro-esophageal reflux disease without esophagitis: Secondary | ICD-10-CM | POA: Diagnosis not present

## 2023-06-11 DIAGNOSIS — G8929 Other chronic pain: Secondary | ICD-10-CM | POA: Diagnosis not present

## 2023-06-11 DIAGNOSIS — I10 Essential (primary) hypertension: Secondary | ICD-10-CM | POA: Diagnosis not present

## 2023-06-11 DIAGNOSIS — T8454XA Infection and inflammatory reaction due to internal left knee prosthesis, initial encounter: Secondary | ICD-10-CM | POA: Diagnosis not present

## 2023-06-11 DIAGNOSIS — I700 Atherosclerosis of aorta: Secondary | ICD-10-CM | POA: Diagnosis not present

## 2023-06-11 DIAGNOSIS — E559 Vitamin D deficiency, unspecified: Secondary | ICD-10-CM | POA: Diagnosis not present

## 2023-06-11 DIAGNOSIS — M5416 Radiculopathy, lumbar region: Secondary | ICD-10-CM | POA: Diagnosis not present

## 2023-06-11 DIAGNOSIS — E1142 Type 2 diabetes mellitus with diabetic polyneuropathy: Secondary | ICD-10-CM | POA: Diagnosis not present

## 2023-06-11 DIAGNOSIS — Z6839 Body mass index (BMI) 39.0-39.9, adult: Secondary | ICD-10-CM | POA: Diagnosis not present

## 2023-06-11 DIAGNOSIS — Z9181 History of falling: Secondary | ICD-10-CM | POA: Diagnosis not present

## 2023-06-11 DIAGNOSIS — Z792 Long term (current) use of antibiotics: Secondary | ICD-10-CM | POA: Diagnosis not present

## 2023-06-11 DIAGNOSIS — Z5181 Encounter for therapeutic drug level monitoring: Secondary | ICD-10-CM | POA: Diagnosis not present

## 2023-06-11 DIAGNOSIS — E7800 Pure hypercholesterolemia, unspecified: Secondary | ICD-10-CM | POA: Diagnosis not present

## 2023-06-11 DIAGNOSIS — Z452 Encounter for adjustment and management of vascular access device: Secondary | ICD-10-CM | POA: Diagnosis not present

## 2023-06-11 DIAGNOSIS — Z7982 Long term (current) use of aspirin: Secondary | ICD-10-CM | POA: Diagnosis not present

## 2023-06-11 DIAGNOSIS — M720 Palmar fascial fibromatosis [Dupuytren]: Secondary | ICD-10-CM | POA: Diagnosis not present

## 2023-06-11 DIAGNOSIS — E1169 Type 2 diabetes mellitus with other specified complication: Secondary | ICD-10-CM | POA: Diagnosis not present

## 2023-06-11 DIAGNOSIS — B952 Enterococcus as the cause of diseases classified elsewhere: Secondary | ICD-10-CM | POA: Diagnosis not present

## 2023-06-11 DIAGNOSIS — M1711 Unilateral primary osteoarthritis, right knee: Secondary | ICD-10-CM | POA: Diagnosis not present

## 2023-06-11 DIAGNOSIS — N400 Enlarged prostate without lower urinary tract symptoms: Secondary | ICD-10-CM | POA: Diagnosis not present

## 2023-06-11 DIAGNOSIS — I2510 Atherosclerotic heart disease of native coronary artery without angina pectoris: Secondary | ICD-10-CM | POA: Diagnosis not present

## 2023-06-11 DIAGNOSIS — G4733 Obstructive sleep apnea (adult) (pediatric): Secondary | ICD-10-CM | POA: Diagnosis not present

## 2023-06-11 DIAGNOSIS — Z79899 Other long term (current) drug therapy: Secondary | ICD-10-CM | POA: Diagnosis not present

## 2023-06-11 LAB — LAB REPORT - SCANNED: EGFR: 104

## 2023-06-12 DIAGNOSIS — A327 Listerial sepsis: Secondary | ICD-10-CM | POA: Diagnosis not present

## 2023-06-12 DIAGNOSIS — A419 Sepsis, unspecified organism: Secondary | ICD-10-CM | POA: Diagnosis not present

## 2023-06-12 DIAGNOSIS — M1712 Unilateral primary osteoarthritis, left knee: Secondary | ICD-10-CM | POA: Diagnosis not present

## 2023-06-12 DIAGNOSIS — R7881 Bacteremia: Secondary | ICD-10-CM | POA: Diagnosis not present

## 2023-06-12 DIAGNOSIS — N390 Urinary tract infection, site not specified: Secondary | ICD-10-CM | POA: Diagnosis not present

## 2023-06-12 DIAGNOSIS — M25462 Effusion, left knee: Secondary | ICD-10-CM | POA: Diagnosis not present

## 2023-06-14 ENCOUNTER — Encounter: Payer: Self-pay | Admitting: Infectious Diseases

## 2023-06-14 ENCOUNTER — Ambulatory Visit: Payer: Medicare HMO | Attending: Infectious Diseases | Admitting: Infectious Diseases

## 2023-06-14 ENCOUNTER — Telehealth: Payer: Self-pay

## 2023-06-14 VITALS — BP 156/87 | HR 80 | Temp 98.4°F | Ht 65.0 in | Wt 220.0 lb

## 2023-06-14 DIAGNOSIS — K219 Gastro-esophageal reflux disease without esophagitis: Secondary | ICD-10-CM | POA: Diagnosis not present

## 2023-06-14 DIAGNOSIS — E1169 Type 2 diabetes mellitus with other specified complication: Secondary | ICD-10-CM | POA: Diagnosis not present

## 2023-06-14 DIAGNOSIS — Z7982 Long term (current) use of aspirin: Secondary | ICD-10-CM | POA: Diagnosis not present

## 2023-06-14 DIAGNOSIS — Z9181 History of falling: Secondary | ICD-10-CM | POA: Diagnosis not present

## 2023-06-14 DIAGNOSIS — E1142 Type 2 diabetes mellitus with diabetic polyneuropathy: Secondary | ICD-10-CM | POA: Diagnosis not present

## 2023-06-14 DIAGNOSIS — B952 Enterococcus as the cause of diseases classified elsewhere: Secondary | ICD-10-CM | POA: Diagnosis not present

## 2023-06-14 DIAGNOSIS — Z452 Encounter for adjustment and management of vascular access device: Secondary | ICD-10-CM | POA: Diagnosis not present

## 2023-06-14 DIAGNOSIS — Z6839 Body mass index (BMI) 39.0-39.9, adult: Secondary | ICD-10-CM | POA: Diagnosis not present

## 2023-06-14 DIAGNOSIS — Z792 Long term (current) use of antibiotics: Secondary | ICD-10-CM | POA: Diagnosis not present

## 2023-06-14 DIAGNOSIS — G8929 Other chronic pain: Secondary | ICD-10-CM | POA: Diagnosis not present

## 2023-06-14 DIAGNOSIS — G4733 Obstructive sleep apnea (adult) (pediatric): Secondary | ICD-10-CM | POA: Diagnosis not present

## 2023-06-14 DIAGNOSIS — E7800 Pure hypercholesterolemia, unspecified: Secondary | ICD-10-CM | POA: Diagnosis not present

## 2023-06-14 DIAGNOSIS — T8454XA Infection and inflammatory reaction due to internal left knee prosthesis, initial encounter: Secondary | ICD-10-CM | POA: Insufficient documentation

## 2023-06-14 DIAGNOSIS — I2510 Atherosclerotic heart disease of native coronary artery without angina pectoris: Secondary | ICD-10-CM | POA: Diagnosis not present

## 2023-06-14 DIAGNOSIS — Z79899 Other long term (current) drug therapy: Secondary | ICD-10-CM | POA: Diagnosis not present

## 2023-06-14 DIAGNOSIS — Y838 Other surgical procedures as the cause of abnormal reaction of the patient, or of later complication, without mention of misadventure at the time of the procedure: Secondary | ICD-10-CM | POA: Insufficient documentation

## 2023-06-14 DIAGNOSIS — N400 Enlarged prostate without lower urinary tract symptoms: Secondary | ICD-10-CM | POA: Diagnosis not present

## 2023-06-14 DIAGNOSIS — M5416 Radiculopathy, lumbar region: Secondary | ICD-10-CM | POA: Diagnosis not present

## 2023-06-14 DIAGNOSIS — M720 Palmar fascial fibromatosis [Dupuytren]: Secondary | ICD-10-CM | POA: Diagnosis not present

## 2023-06-14 DIAGNOSIS — I10 Essential (primary) hypertension: Secondary | ICD-10-CM | POA: Diagnosis not present

## 2023-06-14 DIAGNOSIS — Y798 Miscellaneous orthopedic devices associated with adverse incidents, not elsewhere classified: Secondary | ICD-10-CM | POA: Insufficient documentation

## 2023-06-14 DIAGNOSIS — T8450XD Infection and inflammatory reaction due to unspecified internal joint prosthesis, subsequent encounter: Secondary | ICD-10-CM

## 2023-06-14 DIAGNOSIS — E559 Vitamin D deficiency, unspecified: Secondary | ICD-10-CM | POA: Diagnosis not present

## 2023-06-14 DIAGNOSIS — I700 Atherosclerosis of aorta: Secondary | ICD-10-CM | POA: Diagnosis not present

## 2023-06-14 DIAGNOSIS — M1711 Unilateral primary osteoarthritis, right knee: Secondary | ICD-10-CM | POA: Diagnosis not present

## 2023-06-14 MED ORDER — AMOXICILLIN 500 MG PO CAPS: 500.0000 mg | ORAL_CAPSULE | Freq: Three times a day (TID) | ORAL | 0 refills | Status: DC

## 2023-06-14 NOTE — Telephone Encounter (Signed)
 Message sent to [facility] informing them patient's picc line removed in the office today by Dr. Rivka Safer.  Marlicia Sroka Jonathon Resides, CMA

## 2023-06-14 NOTE — Progress Notes (Signed)
 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 coli bacteremia and complicated UTI in 2021 treated with IV or ertapenem, BPH, TURP on 02/20/2023, recent urine culture in December was ESBL E. coli and he was placed on nitrofurantoin, left knee prosthetic joint infection.and underwent surgery and removal of the entire hardware and placement of antibiotic spacer on 04/23/2023 by Dr.Yaste The cultures came back as Enterococcus faecalis susceptible to ampicillin. Patient had a PICC placement and was sent home on ampicillin IV t I saw him on 05/01/23  and continued ampicillin for 6 weeks His wife does the continuous ampicillin infusion 12 grams every 24 hours He has a home nurse visiting him twice a week. Today is the last day of IV antibiotic His ortho wants to give PO amoxicillin for a few weeks before he takes him for reimplantation  Past Medical History:  Diagnosis Date   Hypercholesteremia    Hypertension    Neuropathy    PONV (postoperative nausea and vomiting)    Pre-diabetes    Seizures (HCC)    x73 at 68 years old; none before or since that time   Sleep apnea     Past Surgical History:  Procedure Laterality Date   APPENDECTOMY     CHOLECYSTECTOMY     COLONOSCOPY N/A 02/12/2015   Procedure: COLONOSCOPY;  Surgeon: Scot Jun, MD;  Location: [facility] ENDOSCOPY;  Service: Endoscopy;  Laterality: N/A;   GANGLION CYST EXCISION     HERNIA REPAIR     TRANSURETHRAL RESECTION OF PROSTATE N/A 02/20/2023   Procedure: TRANSURETHRAL RESECTION OF THE PROSTATE (TURP);  Surgeon: Bjorn Pippin, MD;  Location: [facility] ORS;  Service: Urology;  Laterality: N/A;   TYMPANOPLASTY Left 09/15/2016   Procedure: LEFT SIDE TYMPANOPLASTY;  Surgeon: Drema Halon, MD;  Location: [facility];  Service: ENT;   Laterality: Left;    Social History   Socioeconomic History   Marital status: Married    Spouse name: Not on file   Number of children: Not on file   Years of education: Not on file   Highest education level: Not on file  Occupational History   Not on file  Tobacco Use   Smoking status: Never   Smokeless tobacco: Never  Vaping Use   Vaping status: Never Used  Substance and Sexual Activity   Alcohol use: No   Drug use: No   Sexual activity: Not on file  Other Topics Concern   Not on file  Social History Narrative   Not on file   Social Drivers of Health   Financial Resource Strain: Not on file  Food Insecurity: No Food Insecurity (02/20/2023)   Hunger Vital Sign    Worried About Running Out of Food in the Last Year: Never true    Ran Out of Food in the Last Year: Never true  Transportation Needs: No Transportation Needs (02/20/2023)   PRAPARE - Administrator, Civil Service (Medical): No    Lack of Transportation (Non-Medical): No  Physical Activity: Not on file  Stress: Not on file  Social Connections: Not on file  Intimate Partner Violence: Not At Risk (02/20/2023)   Humiliation, Afraid, Rape, and Kick questionnaire    Fear of Current or Ex-Partner: No    Emotionally Abused: No    Physically  Abused: No    Sexually Abused: No    No family history on file. Allergies  Allergen Reactions   Elemental Sulfur Anaphylaxis and Swelling    Throat swelling    Sulfamethoxazole Anaphylaxis and Swelling   I? Current Outpatient Medications  Medication Sig Dispense Refill   acetaminophen (TYLENOL) 500 MG tablet Take 1,000 mg by mouth every 6 (six) hours as needed for moderate pain (pain score 4-6).     amLODipine (NORVASC) 10 MG tablet Take 10 mg by mouth daily. In the evening     Ampicillin Sodium 10 g SOLR      Ascorbic Acid (VITAMIN C) 1000 MG tablet Take 1,000 mg by mouth daily.     aspirin 81 MG tablet Take 81 mg by mouth daily.     carvedilol (COREG CR)  20 MG 24 hr capsule Take 20 mg by mouth daily.     CINNAMON PO Take 2,000 mg by mouth daily.     cyanocobalamin (VITAMIN B12) 1000 MCG tablet Take 1,000 mcg by mouth daily.     dutasteride (AVODART) 0.5 MG capsule Take 0.5 mg by mouth daily.     gabapentin (NEURONTIN) 300 MG capsule Take 300-600 mg by mouth See admin instructions. Take 300 mg in the morning and 600 mg at night     losartan-hydrochlorothiazide (HYZAAR) 100-25 MG tablet Take 1 tablet by mouth daily.     methenamine (MANDELAMINE) 1 g tablet Take 1,000 mg by mouth 2 (two) times daily.     Multiple Vitamins-Minerals (MULTIVITAMIN WITH MINERALS) tablet Take 1 tablet by mouth daily.     Omega-3 Fatty Acids (FISH OIL) 1000 MG CAPS Take 2,000 mg by mouth daily.     omeprazole (PRILOSEC) 40 MG capsule Take 40 mg by mouth daily.     ondansetron (ZOFRAN-ODT) 4 MG disintegrating tablet Take 1 tablet (4 mg total) by mouth every 8 (eight) hours as needed for nausea or vomiting. 15 tablet 0   oxyCODONE (OXY IR/ROXICODONE) 5 MG immediate release tablet Take 5 mg by mouth every 4 (four) hours as needed.     rosuvastatin (CRESTOR) 20 MG tablet Take 20 mg by mouth at bedtime.     No current facility-administered medications for this visit.     Abtx:  Anti-infectives (From admission, onward)    None       REVIEW OF SYSTEMS:  Const: negative fever, negative chills, negative weight loss Eyes: negative diplopia or visual changes, negative eye pain ENT: negative coryza, negative sore throat Resp: negative cough, hemoptysis, dyspnea Cards: negative for chest pain, palpitations, lower extremity edema GU: Had nocturia and increased frequency after surgery but none today.  GI: Negative for abdominal pain, diarrhea, bleeding, constipation Skin: negative for rash and pruritus Heme: negative for easy bruising and gum/nose bleeding MS: left knee stiffness Neurolo:negative for headaches, dizziness, vertigo, memory problems  Psych: negative for  feelings of anxiety, depression  Endocrine: negative for thyroid, diabetes Allergy/Immunology- as above: Objective:  VITALS:  BP (!) 156/87   Pulse 80   Temp 98.4 F (36.9 C) (Temporal)   Ht 5\' 5"  (1.651 m)   Wt 220 lb (99.8 kg)   BMI 36.61 kg/m    PHYSICAL EXAM:  General: Alert, cooperative, no distress, appears stated age.  Head: Normocephalic, without obvious abnormality, atraumatic. Eyes: Conjunctivae clear, anicteric sclerae. Pupils are equal ENT Nares normal. No drainage or sinus tenderness. Lips, mucosa, and tongue normal. No Thrush Neck: Supple, symmetrical, no adenopathy, thyroid: non tender no carotid  bruit and no JVD. Back: No CVA tenderness. Lungs: Clear to auscultation bilaterally. No Wheezing or Rhonchi. No rales. Heart: Regular rate and rhythm, no murmur, rub or gallop. Abdomen: Soft, non-tender,not distended. Bowel sounds normal. No masses Extremities: left knee surgical scar healed well, no erythema or tenderness Right PICC line Skin: No rashes or lesions. Or bruising Lymph: Cervical, supraclavicular normal. 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  He is completing nearly 7 weeks of IV ampicillin and picc line was removed today HE will go on PO amoxicillin ( his ortho wanted to give oral antibiotic before he takes him for surgery     TURP for BPH  H/o ESBL ecoli urine colonization/UTI  Cystitis/incomplete emptying following TURP which had resolved  Discussed the management with patient and his wife Will communicate with Dr.Yaste his orthopedic surgeon ? Follow up as needed  ____________

## 2023-06-14 NOTE — Telephone Encounter (Signed)
 Thanks

## 2023-06-14 NOTE — Patient Instructions (Addendum)
 Today you are here for follow up of left PJI of knee- you have been on ampicillin IV for 6 weeks and PICC removed today- will give you amoxicillin 500mg  PO TID as requested by your ortho until you see him in 4 weeks

## 2023-07-16 ENCOUNTER — Other Ambulatory Visit: Payer: Self-pay | Admitting: Infectious Diseases

## 2023-07-16 DIAGNOSIS — M25462 Effusion, left knee: Secondary | ICD-10-CM | POA: Diagnosis not present

## 2023-07-16 DIAGNOSIS — M1712 Unilateral primary osteoarthritis, left knee: Secondary | ICD-10-CM | POA: Diagnosis not present

## 2023-07-16 DIAGNOSIS — Z79899 Other long term (current) drug therapy: Secondary | ICD-10-CM | POA: Diagnosis not present

## 2023-07-16 NOTE — Telephone Encounter (Signed)
 Do you want to refill?

## 2023-07-18 ENCOUNTER — Other Ambulatory Visit: Payer: Self-pay

## 2023-07-18 DIAGNOSIS — R3915 Urgency of urination: Secondary | ICD-10-CM | POA: Diagnosis not present

## 2023-07-18 DIAGNOSIS — R351 Nocturia: Secondary | ICD-10-CM | POA: Diagnosis not present

## 2023-07-18 DIAGNOSIS — N3020 Other chronic cystitis without hematuria: Secondary | ICD-10-CM | POA: Diagnosis not present

## 2023-07-18 DIAGNOSIS — N401 Enlarged prostate with lower urinary tract symptoms: Secondary | ICD-10-CM | POA: Diagnosis not present

## 2023-07-18 MED ORDER — AMOXICILLIN 500 MG PO CAPS: 500.0000 mg | ORAL_CAPSULE | Freq: Three times a day (TID) | ORAL | 0 refills | Status: DC

## 2023-07-26 ENCOUNTER — Encounter: Payer: Self-pay | Admitting: Infectious Diseases

## 2023-08-14 ENCOUNTER — Other Ambulatory Visit: Payer: Self-pay | Admitting: Infectious Diseases

## 2023-08-22 ENCOUNTER — Telehealth: Payer: Self-pay

## 2023-08-22 NOTE — Telephone Encounter (Signed)
 Received call from [facility] ortho requesting patient be off antibiotics for 2 weeks to aspirate fluid from his knee. Routing to provider if ok to stop amoxicillin  500mg  PO TID antibiotic for 2 weeks.   [facility] team can be reached at [phone] [phone], LPN

## 2023-08-22 NOTE — Telephone Encounter (Signed)
 Ortho team made aware that MD agrees to stop antibiotic for 2 weeks while ortho.

## 2023-08-28 DIAGNOSIS — R3912 Poor urinary stream: Secondary | ICD-10-CM | POA: Diagnosis not present

## 2023-08-28 DIAGNOSIS — R8271 Bacteriuria: Secondary | ICD-10-CM | POA: Diagnosis not present

## 2023-08-28 DIAGNOSIS — N401 Enlarged prostate with lower urinary tract symptoms: Secondary | ICD-10-CM | POA: Diagnosis not present

## 2023-09-03 DIAGNOSIS — H524 Presbyopia: Secondary | ICD-10-CM | POA: Diagnosis not present

## 2023-09-05 ENCOUNTER — Telehealth: Payer: Self-pay

## 2023-09-05 DIAGNOSIS — T8454XA Infection and inflammatory reaction due to internal left knee prosthesis, initial encounter: Secondary | ICD-10-CM | POA: Diagnosis not present

## 2023-09-05 DIAGNOSIS — M25462 Effusion, left knee: Secondary | ICD-10-CM | POA: Diagnosis not present

## 2023-09-05 NOTE — Telephone Encounter (Signed)
 Patient's spouse called office stating Dr. Lamont Pilsner with [facility] did a urine test and came back positive for bacteria. Was started on Amoxicillin -clavulanate (AUGMENTIN) 500-125 MG tablet.  Per Dr Murrel Arnt knee replacement surgery is on hold until this has cleared. Did request patient call ID to confirm he is taking correct antibiotic. Requested patient have Dr. Bartolo Lights office fax results for provider to review. Julien Odor, RMA

## 2023-09-06 NOTE — Telephone Encounter (Signed)
 We have requested record from Dr. Vida Gravely office.

## 2023-09-12 NOTE — Telephone Encounter (Signed)
 I spoke to the patient's wife after receiving the urine culture results. Patient's wife reports that the patient will finish up the Augmentin today and patient still is not urinating as frequently. She denies any pain with urination, foul odor with urine, hematuria or any other UTI symptoms. I have advised her to reach back out to the urology regarding the patient and ask them if it is ok to restart the Amoxicillin  that he was previously taking due to previous PJI and to follow back up with Dr. Rolinda Climes.

## 2023-09-21 NOTE — Telephone Encounter (Signed)
 Patient wants to follow up to make sure the infection is clear before his knee placement.  Patient scheduled for 10/02/23

## 2023-10-02 ENCOUNTER — Ambulatory Visit: Attending: Infectious Diseases | Admitting: Infectious Diseases

## 2023-10-02 ENCOUNTER — Encounter: Payer: Self-pay | Admitting: Infectious Diseases

## 2023-10-02 VITALS — BP 162/75 | HR 73 | Temp 98.0°F | Ht 65.0 in | Wt 239.0 lb

## 2023-10-02 DIAGNOSIS — Z89522 Acquired absence of left knee: Secondary | ICD-10-CM | POA: Diagnosis not present

## 2023-10-02 DIAGNOSIS — B952 Enterococcus as the cause of diseases classified elsewhere: Secondary | ICD-10-CM | POA: Insufficient documentation

## 2023-10-02 DIAGNOSIS — Z792 Long term (current) use of antibiotics: Secondary | ICD-10-CM | POA: Insufficient documentation

## 2023-10-02 DIAGNOSIS — Z9889 Other specified postprocedural states: Secondary | ICD-10-CM | POA: Insufficient documentation

## 2023-10-02 DIAGNOSIS — Z22358 Carrier of other enterobacterales: Secondary | ICD-10-CM | POA: Insufficient documentation

## 2023-10-02 DIAGNOSIS — Z87440 Personal history of urinary (tract) infections: Secondary | ICD-10-CM | POA: Diagnosis not present

## 2023-10-02 DIAGNOSIS — T8454XD Infection and inflammatory reaction due to internal left knee prosthesis, subsequent encounter: Secondary | ICD-10-CM | POA: Diagnosis not present

## 2023-10-02 DIAGNOSIS — T8450XD Infection and inflammatory reaction due to unspecified internal joint prosthesis, subsequent encounter: Secondary | ICD-10-CM | POA: Diagnosis not present

## 2023-10-02 NOTE — Progress Notes (Signed)
 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 coli bacteremia and complicated UTI in 2021 treated with IV ertapenem , BPH, TURP on 02/20/2023,, left knee prosthetic joint infection. underwent surgery and removal of the entire hardware and placement of antibiotic spacer on 04/23/2023 by Dr.Yaste The cultures came back as Enterococcus faecalis susceptible to ampicillin. HE completed 6 weeks of IV ampicillin , after which he has been on PO amoxicillin . HE recently had a urine culture 6/625 and it was esbl ecoli and his urologist Dr.Wren gave him augmentin  Pt is doing okay- he has some frequency His ortho surgeon wants him to be free of any bacteria before he takes him for stage II procedure  Past Medical History:  Diagnosis Date   Hypercholesteremia    Hypertension    Neuropathy    PONV (postoperative nausea and vomiting)    Pre-diabetes    Seizures (HCC)    x53 at 68 years old; none before or since that time   Sleep apnea     Past Surgical History:  Procedure Laterality Date   APPENDECTOMY     CHOLECYSTECTOMY     COLONOSCOPY N/A 02/12/2015   Procedure: COLONOSCOPY;  Surgeon: Lamar ONEIDA Holmes, MD;  Location: [facility] ENDOSCOPY;  Service: Endoscopy;  Laterality: N/A;   GANGLION CYST EXCISION     HERNIA REPAIR     TRANSURETHRAL RESECTION OF PROSTATE N/A 02/20/2023   Procedure: TRANSURETHRAL RESECTION OF THE PROSTATE (TURP);  Surgeon: Watt Rush, MD;  Location: [facility] ORS;  Service: Urology;  Laterality: N/A;   TYMPANOPLASTY Left 09/15/2016   Procedure: LEFT SIDE TYMPANOPLASTY;  Surgeon: Ethyl Lonni BRAVO, MD;  Location: [facility];  Service: ENT;  Laterality: Left;    Social History   Socioeconomic History   Marital status: Married    Spouse name: Not on file   Number of children: Not on file    Years of education: Not on file   Highest education level: Not on file  Occupational History   Not on file  Tobacco Use   Smoking status: Never   Smokeless tobacco: Never  Vaping Use   Vaping status: Never Used  Substance and Sexual Activity   Alcohol use: No   Drug use: No   Sexual activity: Not on file  Other Topics Concern   Not on file  Social History Narrative   Not on file   Social Drivers of Health   Financial Resource Strain: Not on file  Food Insecurity: No Food Insecurity (02/20/2023)   Hunger Vital Sign    Worried About Running Out of Food in the Last Year: Never true    Ran Out of Food in the Last Year: Never true  Transportation Needs: No Transportation Needs (02/20/2023)   PRAPARE - Administrator, Civil Service (Medical): No    Lack of Transportation (Non-Medical): No  Physical Activity: Not on file  Stress: Not on file  Social Connections: Not on file  Intimate Partner Violence: Not At Risk (02/20/2023)   Humiliation, Afraid, Rape, and Kick questionnaire    Fear of Current or Ex-Partner: No    Emotionally Abused: No    Physically Abused: No    Sexually Abused: No    No family history on file. Allergies  Allergen Reactions   Elemental Sulfur  Anaphylaxis and Swelling    Throat swelling    Sulfamethoxazole Anaphylaxis and Swelling   I? Current Outpatient Medications  Medication Sig Dispense Refill   acetaminophen  (TYLENOL ) 500 MG tablet Take 1,000 mg by mouth every 6 (six) hours as needed for moderate pain (pain score 4-6).     amLODipine  (NORVASC ) 10 MG tablet Take 10 mg by mouth daily. In the evening     amoxicillin  (AMOXIL ) 500 MG capsule Take 1 capsule (500 mg total) by mouth 3 (three) times daily. 90 capsule 1   Ascorbic Acid (VITAMIN C) 1000 MG tablet Take 1,000 mg by mouth daily.     aspirin  81 MG tablet Take 81 mg by mouth daily.     carvedilol  (COREG  CR) 20 MG 24 hr capsule Take 20 mg by mouth daily.     CINNAMON PO Take 2,000 mg  by mouth daily.     cyanocobalamin (VITAMIN B12) 1000 MCG tablet Take 1,000 mcg by mouth daily.     dutasteride  (AVODART ) 0.5 MG capsule Take 0.5 mg by mouth daily.     gabapentin  (NEURONTIN ) 300 MG capsule Take 300-600 mg by mouth See admin instructions. Take 300 mg in the morning and 600 mg at night     losartan -hydrochlorothiazide  (HYZAAR) 100-25 MG tablet Take 1 tablet by mouth daily.     methenamine  (MANDELAMINE) 1 g tablet Take 1,000 mg by mouth 2 (two) times daily.     Multiple Vitamins-Minerals (MULTIVITAMIN WITH MINERALS) tablet Take 1 tablet by mouth daily.     Omega-3 Fatty Acids (FISH OIL) 1000 MG CAPS Take 2,000 mg by mouth daily.     omeprazole (PRILOSEC) 40 MG capsule Take 40 mg by mouth daily.     ondansetron  (ZOFRAN -ODT) 4 MG disintegrating tablet Take 1 tablet (4 mg total) by mouth every 8 (eight) hours as needed for nausea or vomiting. 15 tablet 0   oxyCODONE  (OXY IR/ROXICODONE ) 5 MG immediate release tablet Take 5 mg by mouth every 4 (four) hours as needed.     rosuvastatin  (CRESTOR ) 20 MG tablet Take 20 mg by mouth at bedtime.     No current facility-administered medications for this visit.     Abtx:  Anti-infectives (From admission, onward)    None       REVIEW OF SYSTEMS:  Const: negative fever, negative chills, negative weight loss Eyes: negative diplopia or visual changes, negative eye pain ENT: negative coryza, negative sore throat Resp: negative cough, hemoptysis, dyspnea Cards: negative for chest pain, palpitations, lower extremity edema HL:dnfz frequency .  GI: Negative for abdominal pain, diarrhea, bleeding, constipation Skin: negative for rash and pruritus Heme: negative for easy bruising and gum/nose bleeding MS: left knee stiffness Neurolo:negative for headaches, dizziness, vertigo, memory problems  Psych: negative for feelings of anxiety, depression  Endocrine: negative for thyroid, diabetes Allergy/Immunology- as above: Objective:  VITALS:   BP (!) 162/75   Pulse 73   Temp 98 F (36.7 C) (Oral)   Ht 5' 5 (1.651 m)   Wt 239 lb (108.4 kg)   SpO2 93%   BMI 39.77 kg/m    PHYSICAL EXAM:  General: Alert, cooperative, no distress, appears stated age.  Head: Normocephalic, without obvious abnormality, atraumatic. Eyes: Conjunctivae clear, anicteric sclerae. Pupils are equal ENT Nares normal. No drainage or sinus tenderness. Lips, mucosa, and tongue normal. No Thrush Neck: Supple, symmetrical, no adenopathy, thyroid: non tender no carotid bruit and no JVD. Back: No CVA tenderness. Lungs: Clear to auscultation bilaterally. No Wheezing or  Rhonchi. No rales. Heart: Regular rate and rhythm, no murmur, rub or gallop. Abdomen: Soft, non-tender,not distended. Bowel sounds normal. No masses Extremities: left knee surgical scar healed well, no erythema or tenderness Skin: No rashes or lesions. Or bruising Lymph: Cervical, supraclavicular normal. 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  Completed 7 weeks of IV ampicillin and on amoxicillin  now Has been on 5 months of antibiotic will discuss with his ortho   H/o ESBL ecoli urine colonization/UTI- it may be difficult to eradicate the ecoli- may do a dose of gentamicin  Iv or bladder flushes - will discuss with urologist and ortho    H/o TURP for BPH   Discussed the management with patient and his wife Will communicate with Dr.Yaste his orthopedic surgeon ? Will call him after I discuss with his surgeon ____________

## 2023-10-02 NOTE — Patient Instructions (Signed)
  You came in today for an evaluation to ensure there are no infections in your body before your upcoming knee surgery. We discussed your history of knee prosthetic joint infection and urinary tract infection, and reviewed your current symptoms and medications.  YOUR PLAN:  -PROSTHETIC JOINT INFECTION OF LEFT KNEE: Your left knee prosthetic joint is infected with a type of bacteria called Enterococcus faecalis. You have an antibiotic spacer in place and are awaiting knee replacement surgery, which is postponed until after August 2nd. You recently experienced some knee locking and pain, but it has since eased. We will contact Dr. Barbarann to discuss stopping your antibiotics and the timing of your surgery. For now, hold off on taking antibiotics until we have further discussion. We plan to stop antibiotics two weeks before your surgery to allow for fluid aspiration and analysis.  -URINARY TRACT INFECTION (UTI): You had a urinary tract infection caused by a type of bacteria called ESBL E. coli, which was previously treated. You are currently experiencing increased frequency of urination without any burning sensation. We are awaiting the results of your urine culture to assess the current status of the infection. The amoxicillin  you are taking may not be effective if the UTI is due to ESBL E. coli. We will obtain the urine culture results from Dr. Brunetta and discuss whether you should continue taking amoxicillin .  INSTRUCTIONS:  will call you after spoeaking to yaste and Brunetta.  Your knee replacement surgery is planned for after August 2nd, and we will stop antibiotics two weeks before the surgery for fluid aspiration and analysis.

## 2023-10-16 DIAGNOSIS — M25462 Effusion, left knee: Secondary | ICD-10-CM | POA: Diagnosis not present

## 2023-10-17 DIAGNOSIS — R140 Abdominal distension (gaseous): Secondary | ICD-10-CM | POA: Diagnosis not present

## 2023-10-17 DIAGNOSIS — R600 Localized edema: Secondary | ICD-10-CM | POA: Diagnosis not present

## 2023-10-23 ENCOUNTER — Other Ambulatory Visit: Payer: Self-pay | Admitting: Infectious Diseases

## 2023-10-23 DIAGNOSIS — R140 Abdominal distension (gaseous): Secondary | ICD-10-CM | POA: Diagnosis not present

## 2023-10-23 DIAGNOSIS — K760 Fatty (change of) liver, not elsewhere classified: Secondary | ICD-10-CM | POA: Diagnosis not present

## 2023-10-24 NOTE — Telephone Encounter (Signed)
Do you want to continue?

## 2023-10-26 DIAGNOSIS — K760 Fatty (change of) liver, not elsewhere classified: Secondary | ICD-10-CM | POA: Diagnosis not present

## 2023-10-26 DIAGNOSIS — R140 Abdominal distension (gaseous): Secondary | ICD-10-CM | POA: Diagnosis not present

## 2023-10-30 ENCOUNTER — Telehealth: Payer: Self-pay

## 2023-10-30 NOTE — Telephone Encounter (Signed)
 Per Dr. Fayette she would like the patient to hold the amoxicillin  for now and have a repeat urinalysis and urine culture performed prior to surgery.  Patient reports possible date for surgery is 11/29/23 with Dr. Larnell. Patient will have urinalysis and urine culture performed at his pcp office on 11/19/23. I have spoke to Pandora with Dr. Firman office (pcp)  and she advised me to fax orders there   Phone [phone] Fax# [phone]

## 2023-10-30 NOTE — Telephone Encounter (Signed)
 Per Dr. Fayette patient will need to remain off antibiotics and repeat urinalysis 1 week -10 days before surgery.

## 2023-11-12 DIAGNOSIS — M25562 Pain in left knee: Secondary | ICD-10-CM | POA: Diagnosis not present

## 2023-11-12 DIAGNOSIS — R0789 Other chest pain: Secondary | ICD-10-CM | POA: Diagnosis not present

## 2023-11-12 DIAGNOSIS — E1169 Type 2 diabetes mellitus with other specified complication: Secondary | ICD-10-CM | POA: Diagnosis not present

## 2023-11-12 DIAGNOSIS — Z01818 Encounter for other preprocedural examination: Secondary | ICD-10-CM | POA: Diagnosis not present

## 2023-11-12 DIAGNOSIS — N400 Enlarged prostate without lower urinary tract symptoms: Secondary | ICD-10-CM | POA: Diagnosis not present

## 2023-11-12 DIAGNOSIS — E785 Hyperlipidemia, unspecified: Secondary | ICD-10-CM | POA: Diagnosis not present

## 2023-11-12 DIAGNOSIS — G8929 Other chronic pain: Secondary | ICD-10-CM | POA: Diagnosis not present

## 2023-11-12 DIAGNOSIS — E7800 Pure hypercholesterolemia, unspecified: Secondary | ICD-10-CM | POA: Diagnosis not present

## 2023-11-12 DIAGNOSIS — N390 Urinary tract infection, site not specified: Secondary | ICD-10-CM | POA: Diagnosis not present

## 2023-11-12 DIAGNOSIS — I10 Essential (primary) hypertension: Secondary | ICD-10-CM | POA: Diagnosis not present

## 2023-12-12 DIAGNOSIS — M79609 Pain in unspecified limb: Secondary | ICD-10-CM | POA: Diagnosis not present

## 2023-12-12 DIAGNOSIS — Z79899 Other long term (current) drug therapy: Secondary | ICD-10-CM | POA: Diagnosis not present

## 2023-12-12 DIAGNOSIS — T8454XA Infection and inflammatory reaction due to internal left knee prosthesis, initial encounter: Secondary | ICD-10-CM | POA: Diagnosis not present

## 2023-12-12 DIAGNOSIS — R52 Pain, unspecified: Secondary | ICD-10-CM | POA: Diagnosis not present

## 2023-12-12 DIAGNOSIS — M25462 Effusion, left knee: Secondary | ICD-10-CM | POA: Diagnosis not present

## 2023-12-25 DIAGNOSIS — E1169 Type 2 diabetes mellitus with other specified complication: Secondary | ICD-10-CM | POA: Diagnosis not present

## 2023-12-25 DIAGNOSIS — M2550 Pain in unspecified joint: Secondary | ICD-10-CM | POA: Diagnosis not present

## 2023-12-25 DIAGNOSIS — E785 Hyperlipidemia, unspecified: Secondary | ICD-10-CM | POA: Diagnosis not present

## 2023-12-25 DIAGNOSIS — M791 Myalgia, unspecified site: Secondary | ICD-10-CM | POA: Diagnosis not present

## 2024-01-03 DIAGNOSIS — Z4733 Aftercare following explantation of knee joint prosthesis: Secondary | ICD-10-CM | POA: Diagnosis not present

## 2024-01-03 DIAGNOSIS — M7989 Other specified soft tissue disorders: Secondary | ICD-10-CM | POA: Diagnosis not present

## 2024-01-03 DIAGNOSIS — T8454XA Infection and inflammatory reaction due to internal left knee prosthesis, initial encounter: Secondary | ICD-10-CM | POA: Diagnosis not present

## 2024-01-03 DIAGNOSIS — Z96652 Presence of left artificial knee joint: Secondary | ICD-10-CM | POA: Diagnosis not present

## 2024-01-03 DIAGNOSIS — G8918 Other acute postprocedural pain: Secondary | ICD-10-CM | POA: Diagnosis not present

## 2024-01-03 DIAGNOSIS — Z471 Aftercare following joint replacement surgery: Secondary | ICD-10-CM | POA: Diagnosis not present

## 2024-01-03 DIAGNOSIS — I10 Essential (primary) hypertension: Secondary | ICD-10-CM | POA: Diagnosis not present

## 2024-01-06 DIAGNOSIS — E7800 Pure hypercholesterolemia, unspecified: Secondary | ICD-10-CM | POA: Diagnosis not present

## 2024-01-06 DIAGNOSIS — I2510 Atherosclerotic heart disease of native coronary artery without angina pectoris: Secondary | ICD-10-CM | POA: Diagnosis not present

## 2024-01-06 DIAGNOSIS — N400 Enlarged prostate without lower urinary tract symptoms: Secondary | ICD-10-CM | POA: Diagnosis not present

## 2024-01-06 DIAGNOSIS — M5416 Radiculopathy, lumbar region: Secondary | ICD-10-CM | POA: Diagnosis not present

## 2024-01-06 DIAGNOSIS — E1169 Type 2 diabetes mellitus with other specified complication: Secondary | ICD-10-CM | POA: Diagnosis not present

## 2024-01-06 DIAGNOSIS — Z6838 Body mass index (BMI) 38.0-38.9, adult: Secondary | ICD-10-CM | POA: Diagnosis not present

## 2024-01-06 DIAGNOSIS — G8929 Other chronic pain: Secondary | ICD-10-CM | POA: Diagnosis not present

## 2024-01-06 DIAGNOSIS — I10 Essential (primary) hypertension: Secondary | ICD-10-CM | POA: Diagnosis not present

## 2024-01-06 DIAGNOSIS — I700 Atherosclerosis of aorta: Secondary | ICD-10-CM | POA: Diagnosis not present

## 2024-01-06 DIAGNOSIS — K219 Gastro-esophageal reflux disease without esophagitis: Secondary | ICD-10-CM | POA: Diagnosis not present

## 2024-01-06 DIAGNOSIS — G4733 Obstructive sleep apnea (adult) (pediatric): Secondary | ICD-10-CM | POA: Diagnosis not present

## 2024-01-06 DIAGNOSIS — M25462 Effusion, left knee: Secondary | ICD-10-CM | POA: Diagnosis not present

## 2024-01-06 DIAGNOSIS — Z87440 Personal history of urinary (tract) infections: Secondary | ICD-10-CM | POA: Diagnosis not present

## 2024-01-06 DIAGNOSIS — E1142 Type 2 diabetes mellitus with diabetic polyneuropathy: Secondary | ICD-10-CM | POA: Diagnosis not present

## 2024-01-07 DIAGNOSIS — N400 Enlarged prostate without lower urinary tract symptoms: Secondary | ICD-10-CM | POA: Diagnosis not present

## 2024-01-09 DIAGNOSIS — M25462 Effusion, left knee: Secondary | ICD-10-CM | POA: Diagnosis not present

## 2024-01-09 DIAGNOSIS — G8929 Other chronic pain: Secondary | ICD-10-CM | POA: Diagnosis not present

## 2024-01-09 DIAGNOSIS — K219 Gastro-esophageal reflux disease without esophagitis: Secondary | ICD-10-CM | POA: Diagnosis not present

## 2024-01-09 DIAGNOSIS — Z87440 Personal history of urinary (tract) infections: Secondary | ICD-10-CM | POA: Diagnosis not present

## 2024-01-09 DIAGNOSIS — G4733 Obstructive sleep apnea (adult) (pediatric): Secondary | ICD-10-CM | POA: Diagnosis not present

## 2024-01-09 DIAGNOSIS — N400 Enlarged prostate without lower urinary tract symptoms: Secondary | ICD-10-CM | POA: Diagnosis not present

## 2024-01-09 DIAGNOSIS — I2510 Atherosclerotic heart disease of native coronary artery without angina pectoris: Secondary | ICD-10-CM | POA: Diagnosis not present

## 2024-01-09 DIAGNOSIS — I700 Atherosclerosis of aorta: Secondary | ICD-10-CM | POA: Diagnosis not present

## 2024-01-09 DIAGNOSIS — E1142 Type 2 diabetes mellitus with diabetic polyneuropathy: Secondary | ICD-10-CM | POA: Diagnosis not present

## 2024-01-09 DIAGNOSIS — E1169 Type 2 diabetes mellitus with other specified complication: Secondary | ICD-10-CM | POA: Diagnosis not present

## 2024-01-09 DIAGNOSIS — I10 Essential (primary) hypertension: Secondary | ICD-10-CM | POA: Diagnosis not present

## 2024-01-09 DIAGNOSIS — T8454XA Infection and inflammatory reaction due to internal left knee prosthesis, initial encounter: Secondary | ICD-10-CM | POA: Diagnosis not present

## 2024-01-09 DIAGNOSIS — M5416 Radiculopathy, lumbar region: Secondary | ICD-10-CM | POA: Diagnosis not present

## 2024-01-09 DIAGNOSIS — E7800 Pure hypercholesterolemia, unspecified: Secondary | ICD-10-CM | POA: Diagnosis not present

## 2024-01-09 DIAGNOSIS — Z6838 Body mass index (BMI) 38.0-38.9, adult: Secondary | ICD-10-CM | POA: Diagnosis not present

## 2024-01-11 DIAGNOSIS — Z87440 Personal history of urinary (tract) infections: Secondary | ICD-10-CM | POA: Diagnosis not present

## 2024-01-11 DIAGNOSIS — Z6838 Body mass index (BMI) 38.0-38.9, adult: Secondary | ICD-10-CM | POA: Diagnosis not present

## 2024-01-11 DIAGNOSIS — M5416 Radiculopathy, lumbar region: Secondary | ICD-10-CM | POA: Diagnosis not present

## 2024-01-11 DIAGNOSIS — G4733 Obstructive sleep apnea (adult) (pediatric): Secondary | ICD-10-CM | POA: Diagnosis not present

## 2024-01-11 DIAGNOSIS — G8929 Other chronic pain: Secondary | ICD-10-CM | POA: Diagnosis not present

## 2024-01-11 DIAGNOSIS — K219 Gastro-esophageal reflux disease without esophagitis: Secondary | ICD-10-CM | POA: Diagnosis not present

## 2024-01-11 DIAGNOSIS — I700 Atherosclerosis of aorta: Secondary | ICD-10-CM | POA: Diagnosis not present

## 2024-01-11 DIAGNOSIS — I2510 Atherosclerotic heart disease of native coronary artery without angina pectoris: Secondary | ICD-10-CM | POA: Diagnosis not present

## 2024-01-11 DIAGNOSIS — E1142 Type 2 diabetes mellitus with diabetic polyneuropathy: Secondary | ICD-10-CM | POA: Diagnosis not present

## 2024-01-11 DIAGNOSIS — N400 Enlarged prostate without lower urinary tract symptoms: Secondary | ICD-10-CM | POA: Diagnosis not present

## 2024-01-11 DIAGNOSIS — E1169 Type 2 diabetes mellitus with other specified complication: Secondary | ICD-10-CM | POA: Diagnosis not present

## 2024-01-11 DIAGNOSIS — E7800 Pure hypercholesterolemia, unspecified: Secondary | ICD-10-CM | POA: Diagnosis not present

## 2024-01-11 DIAGNOSIS — M25462 Effusion, left knee: Secondary | ICD-10-CM | POA: Diagnosis not present

## 2024-01-11 DIAGNOSIS — T8454XA Infection and inflammatory reaction due to internal left knee prosthesis, initial encounter: Secondary | ICD-10-CM | POA: Diagnosis not present

## 2024-01-11 DIAGNOSIS — I10 Essential (primary) hypertension: Secondary | ICD-10-CM | POA: Diagnosis not present

## 2024-01-14 DIAGNOSIS — N400 Enlarged prostate without lower urinary tract symptoms: Secondary | ICD-10-CM | POA: Diagnosis not present

## 2024-01-17 DIAGNOSIS — M25562 Pain in left knee: Secondary | ICD-10-CM | POA: Diagnosis not present

## 2024-01-17 DIAGNOSIS — M25662 Stiffness of left knee, not elsewhere classified: Secondary | ICD-10-CM | POA: Diagnosis not present

## 2024-01-21 DIAGNOSIS — M25562 Pain in left knee: Secondary | ICD-10-CM | POA: Diagnosis not present

## 2024-01-21 DIAGNOSIS — M25662 Stiffness of left knee, not elsewhere classified: Secondary | ICD-10-CM | POA: Diagnosis not present

## 2024-01-24 NOTE — Progress Notes (Signed)
 Martin Hanna                                          MRN: [id number]   01/24/2024   The [facility] Quality Team Specialist reviewed this patient medical record for the purposes of chart review for care gap closure. The following were reviewed: chart review for care gap closure-controlling blood pressure.    [facility] Quality Team

## 2024-01-25 DIAGNOSIS — M25562 Pain in left knee: Secondary | ICD-10-CM | POA: Diagnosis not present

## 2024-01-25 DIAGNOSIS — M25662 Stiffness of left knee, not elsewhere classified: Secondary | ICD-10-CM | POA: Diagnosis not present

## 2024-01-28 DIAGNOSIS — M25562 Pain in left knee: Secondary | ICD-10-CM | POA: Diagnosis not present

## 2024-01-28 DIAGNOSIS — M25662 Stiffness of left knee, not elsewhere classified: Secondary | ICD-10-CM | POA: Diagnosis not present

## 2024-01-31 DIAGNOSIS — M25562 Pain in left knee: Secondary | ICD-10-CM | POA: Diagnosis not present

## 2024-01-31 DIAGNOSIS — M25662 Stiffness of left knee, not elsewhere classified: Secondary | ICD-10-CM | POA: Diagnosis not present

## 2024-02-04 DIAGNOSIS — M25562 Pain in left knee: Secondary | ICD-10-CM | POA: Diagnosis not present

## 2024-02-04 DIAGNOSIS — M25662 Stiffness of left knee, not elsewhere classified: Secondary | ICD-10-CM | POA: Diagnosis not present

## 2024-02-08 DIAGNOSIS — M25562 Pain in left knee: Secondary | ICD-10-CM | POA: Diagnosis not present

## 2024-02-08 DIAGNOSIS — M25662 Stiffness of left knee, not elsewhere classified: Secondary | ICD-10-CM | POA: Diagnosis not present

## 2024-02-11 DIAGNOSIS — M25562 Pain in left knee: Secondary | ICD-10-CM | POA: Diagnosis not present

## 2024-02-11 DIAGNOSIS — M25662 Stiffness of left knee, not elsewhere classified: Secondary | ICD-10-CM | POA: Diagnosis not present

## 2024-02-13 DIAGNOSIS — E785 Hyperlipidemia, unspecified: Secondary | ICD-10-CM | POA: Diagnosis not present

## 2024-02-13 DIAGNOSIS — E1169 Type 2 diabetes mellitus with other specified complication: Secondary | ICD-10-CM | POA: Diagnosis not present

## 2024-02-15 DIAGNOSIS — M25562 Pain in left knee: Secondary | ICD-10-CM | POA: Diagnosis not present

## 2024-02-15 DIAGNOSIS — M25662 Stiffness of left knee, not elsewhere classified: Secondary | ICD-10-CM | POA: Diagnosis not present

## 2024-02-18 DIAGNOSIS — M25462 Effusion, left knee: Secondary | ICD-10-CM | POA: Diagnosis not present

## 2024-02-27 DIAGNOSIS — M25662 Stiffness of left knee, not elsewhere classified: Secondary | ICD-10-CM | POA: Diagnosis not present

## 2024-02-27 DIAGNOSIS — M25562 Pain in left knee: Secondary | ICD-10-CM | POA: Diagnosis not present

## 2024-02-29 DIAGNOSIS — R6889 Other general symptoms and signs: Secondary | ICD-10-CM | POA: Diagnosis not present

## 2024-02-29 DIAGNOSIS — J069 Acute upper respiratory infection, unspecified: Secondary | ICD-10-CM | POA: Diagnosis not present

## 2024-03-03 DIAGNOSIS — M25662 Stiffness of left knee, not elsewhere classified: Secondary | ICD-10-CM | POA: Diagnosis not present

## 2024-03-03 DIAGNOSIS — M25562 Pain in left knee: Secondary | ICD-10-CM | POA: Diagnosis not present

## 2024-03-12 DIAGNOSIS — M25562 Pain in left knee: Secondary | ICD-10-CM | POA: Diagnosis not present

## 2024-03-12 DIAGNOSIS — M25662 Stiffness of left knee, not elsewhere classified: Secondary | ICD-10-CM | POA: Diagnosis not present
